# Patient Record
Sex: Female | Born: 1975 | Race: Black or African American | Hispanic: No | Marital: Married | Smoking: Never smoker
Health system: Southern US, Community
[De-identification: ages and names within clinical notes are randomized; demographics above are authoritative.]

## PROBLEM LIST (undated history)

## (undated) ENCOUNTER — Emergency Department (HOSPITAL_BASED_OUTPATIENT_CLINIC_OR_DEPARTMENT_OTHER): Admission: EM | Payer: PRIVATE HEALTH INSURANCE | Source: Home / Self Care

## (undated) DIAGNOSIS — J4540 Moderate persistent asthma, uncomplicated: Secondary | ICD-10-CM

## (undated) DIAGNOSIS — I10 Essential (primary) hypertension: Secondary | ICD-10-CM

## (undated) DIAGNOSIS — J3089 Other allergic rhinitis: Secondary | ICD-10-CM

## (undated) DIAGNOSIS — D649 Anemia, unspecified: Secondary | ICD-10-CM

## (undated) DIAGNOSIS — H1010 Acute atopic conjunctivitis, unspecified eye: Secondary | ICD-10-CM

## (undated) DIAGNOSIS — L500 Allergic urticaria: Secondary | ICD-10-CM

## (undated) DIAGNOSIS — Z973 Presence of spectacles and contact lenses: Secondary | ICD-10-CM

## (undated) DIAGNOSIS — Z8742 Personal history of other diseases of the female genital tract: Secondary | ICD-10-CM

## (undated) DIAGNOSIS — D250 Submucous leiomyoma of uterus: Secondary | ICD-10-CM

## (undated) DIAGNOSIS — Z8719 Personal history of other diseases of the digestive system: Secondary | ICD-10-CM

## (undated) DIAGNOSIS — Z9889 Other specified postprocedural states: Secondary | ICD-10-CM

## (undated) DIAGNOSIS — J302 Other seasonal allergic rhinitis: Secondary | ICD-10-CM

## (undated) DIAGNOSIS — L509 Urticaria, unspecified: Secondary | ICD-10-CM

## (undated) DIAGNOSIS — D509 Iron deficiency anemia, unspecified: Secondary | ICD-10-CM

## (undated) DIAGNOSIS — E876 Hypokalemia: Secondary | ICD-10-CM

## (undated) DIAGNOSIS — Z862 Personal history of diseases of the blood and blood-forming organs and certain disorders involving the immune mechanism: Secondary | ICD-10-CM

## (undated) HISTORY — DX: Urticaria, unspecified: L50.9

## (undated) HISTORY — PX: TRANSTHORACIC ECHOCARDIOGRAM: SHX275

## (undated) HISTORY — PX: CARDIOVASCULAR STRESS TEST: SHX262

## (undated) HISTORY — DX: Other specified postprocedural states: Z98.890

---
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
## 1997-12-25 ENCOUNTER — Encounter: Payer: Self-pay | Admitting: Emergency Medicine

## 1997-12-25 ENCOUNTER — Emergency Department (HOSPITAL_COMMUNITY): Admission: EM | Admit: 1997-12-25 | Discharge: 1997-12-25 | Payer: Self-pay | Admitting: Emergency Medicine

## 1998-02-01 ENCOUNTER — Inpatient Hospital Stay (HOSPITAL_COMMUNITY): Admission: AD | Admit: 1998-02-01 | Discharge: 1998-02-01 | Payer: Self-pay | Admitting: *Deleted

## 1998-03-27 ENCOUNTER — Emergency Department (HOSPITAL_COMMUNITY): Admission: EM | Admit: 1998-03-27 | Discharge: 1998-03-27 | Payer: Self-pay | Admitting: Emergency Medicine

## 1998-08-15 ENCOUNTER — Emergency Department (HOSPITAL_COMMUNITY): Admission: EM | Admit: 1998-08-15 | Discharge: 1998-08-15 | Payer: Self-pay | Admitting: Emergency Medicine

## 1998-08-26 ENCOUNTER — Inpatient Hospital Stay (HOSPITAL_COMMUNITY): Admission: AD | Admit: 1998-08-26 | Discharge: 1998-08-26 | Payer: Self-pay | Admitting: Obstetrics & Gynecology

## 1999-10-13 ENCOUNTER — Emergency Department (HOSPITAL_COMMUNITY): Admission: EM | Admit: 1999-10-13 | Discharge: 1999-10-13 | Payer: Self-pay | Admitting: *Deleted

## 2000-04-07 ENCOUNTER — Emergency Department (HOSPITAL_COMMUNITY): Admission: EM | Admit: 2000-04-07 | Discharge: 2000-04-07 | Payer: Self-pay | Admitting: Emergency Medicine

## 2000-10-05 ENCOUNTER — Emergency Department (HOSPITAL_COMMUNITY): Admission: EM | Admit: 2000-10-05 | Discharge: 2000-10-05 | Payer: Self-pay | Admitting: Emergency Medicine

## 2000-12-22 ENCOUNTER — Emergency Department (HOSPITAL_COMMUNITY): Admission: EM | Admit: 2000-12-22 | Discharge: 2000-12-22 | Payer: Self-pay | Admitting: Nurse Practitioner

## 2001-11-13 ENCOUNTER — Inpatient Hospital Stay (HOSPITAL_COMMUNITY): Admission: AD | Admit: 2001-11-13 | Discharge: 2001-11-13 | Payer: Self-pay | Admitting: Obstetrics and Gynecology

## 2002-01-03 ENCOUNTER — Inpatient Hospital Stay (HOSPITAL_COMMUNITY): Admission: AD | Admit: 2002-01-03 | Discharge: 2002-01-03 | Payer: Self-pay | Admitting: Family Medicine

## 2002-01-31 ENCOUNTER — Inpatient Hospital Stay (HOSPITAL_COMMUNITY): Admission: AD | Admit: 2002-01-31 | Discharge: 2002-01-31 | Payer: Self-pay | Admitting: *Deleted

## 2002-08-08 ENCOUNTER — Inpatient Hospital Stay (HOSPITAL_COMMUNITY): Admission: AD | Admit: 2002-08-08 | Discharge: 2002-08-08 | Payer: Self-pay | Admitting: *Deleted

## 2002-09-23 ENCOUNTER — Inpatient Hospital Stay (HOSPITAL_COMMUNITY): Admission: AD | Admit: 2002-09-23 | Discharge: 2002-09-23 | Payer: Self-pay | Admitting: *Deleted

## 2003-04-21 ENCOUNTER — Emergency Department (HOSPITAL_COMMUNITY): Admission: EM | Admit: 2003-04-21 | Discharge: 2003-04-21 | Payer: Self-pay | Admitting: Emergency Medicine

## 2003-05-26 ENCOUNTER — Emergency Department (HOSPITAL_COMMUNITY): Admission: EM | Admit: 2003-05-26 | Discharge: 2003-05-26 | Payer: Self-pay | Admitting: Emergency Medicine

## 2003-06-20 ENCOUNTER — Emergency Department (HOSPITAL_COMMUNITY): Admission: EM | Admit: 2003-06-20 | Discharge: 2003-06-20 | Payer: Self-pay | Admitting: Emergency Medicine

## 2003-06-28 ENCOUNTER — Inpatient Hospital Stay (HOSPITAL_COMMUNITY): Admission: AD | Admit: 2003-06-28 | Discharge: 2003-06-28 | Payer: Self-pay | Admitting: *Deleted

## 2003-12-08 ENCOUNTER — Ambulatory Visit: Payer: Self-pay | Admitting: Family Medicine

## 2003-12-21 ENCOUNTER — Ambulatory Visit: Payer: Self-pay | Admitting: *Deleted

## 2004-02-02 ENCOUNTER — Ambulatory Visit: Payer: Self-pay | Admitting: Family Medicine

## 2004-02-05 ENCOUNTER — Ambulatory Visit: Payer: Self-pay | Admitting: Family Medicine

## 2004-03-30 ENCOUNTER — Ambulatory Visit: Payer: Self-pay | Admitting: Internal Medicine

## 2004-04-05 ENCOUNTER — Ambulatory Visit: Payer: Self-pay | Admitting: Family Medicine

## 2004-05-03 ENCOUNTER — Ambulatory Visit: Payer: Self-pay | Admitting: Internal Medicine

## 2004-06-07 ENCOUNTER — Ambulatory Visit: Payer: Self-pay | Admitting: Family Medicine

## 2004-06-29 ENCOUNTER — Ambulatory Visit: Payer: Self-pay | Admitting: Internal Medicine

## 2004-07-25 ENCOUNTER — Ambulatory Visit: Payer: Self-pay | Admitting: Internal Medicine

## 2004-09-05 ENCOUNTER — Ambulatory Visit: Payer: Self-pay | Admitting: Family Medicine

## 2004-09-22 ENCOUNTER — Ambulatory Visit: Payer: Self-pay | Admitting: Family Medicine

## 2005-06-01 ENCOUNTER — Emergency Department (HOSPITAL_COMMUNITY): Admission: EM | Admit: 2005-06-01 | Discharge: 2005-06-01 | Payer: Self-pay | Admitting: Emergency Medicine

## 2007-03-14 ENCOUNTER — Other Ambulatory Visit: Admission: RE | Admit: 2007-03-14 | Discharge: 2007-03-14 | Payer: Self-pay | Admitting: Gynecology

## 2007-07-26 HISTORY — PX: DIAGNOSTIC LAPAROSCOPY: SUR761

## 2008-01-18 ENCOUNTER — Inpatient Hospital Stay (HOSPITAL_COMMUNITY): Admission: AD | Admit: 2008-01-18 | Discharge: 2008-01-18 | Payer: Self-pay | Admitting: Gynecology

## 2008-03-05 ENCOUNTER — Ambulatory Visit (HOSPITAL_COMMUNITY): Admission: RE | Admit: 2008-03-05 | Discharge: 2008-03-05 | Payer: Self-pay | Admitting: Gynecology

## 2008-04-21 ENCOUNTER — Emergency Department (HOSPITAL_COMMUNITY): Admission: EM | Admit: 2008-04-21 | Discharge: 2008-04-21 | Payer: Self-pay | Admitting: Emergency Medicine

## 2009-04-25 ENCOUNTER — Inpatient Hospital Stay (HOSPITAL_COMMUNITY): Admission: AD | Admit: 2009-04-25 | Discharge: 2009-04-25 | Payer: Self-pay | Admitting: Specialist

## 2009-11-09 ENCOUNTER — Inpatient Hospital Stay (HOSPITAL_COMMUNITY): Admission: AD | Admit: 2009-11-09 | Discharge: 2009-11-09 | Payer: Self-pay | Admitting: Gynecology

## 2010-06-09 LAB — GC/CHLAMYDIA PROBE AMP, GENITAL
Chlamydia, DNA Probe: NEGATIVE
GC Probe Amp, Genital: NEGATIVE

## 2010-06-09 LAB — WET PREP, GENITAL

## 2010-06-12 LAB — WET PREP, GENITAL
Trich, Wet Prep: NONE SEEN
Yeast Wet Prep HPF POC: NONE SEEN

## 2010-06-12 LAB — GC/CHLAMYDIA PROBE AMP, GENITAL
Chlamydia, DNA Probe: NEGATIVE
GC Probe Amp, Genital: NEGATIVE

## 2010-06-12 LAB — URINALYSIS, ROUTINE W REFLEX MICROSCOPIC
Bilirubin Urine: NEGATIVE
Glucose, UA: NEGATIVE mg/dL
Hgb urine dipstick: NEGATIVE
Specific Gravity, Urine: <1.005 — ABNORMAL LOW (ref 1.005–1.030)
pH: 5.5 (ref 5.0–8.0)

## 2010-08-09 NOTE — Consult Note (Signed)
Victoria George, Victoria George                ACCOUNT NO.:  [id number]   MEDICAL RECORD NO.:  [id number]          PATIENT TYPE:  MAT   LOCATION:  [facility]                          FACILITY:  [facility]   PHYSICIAN:  M. Leda Quail, MD  DATE OF BIRTH:  02-22-1976   DATE OF CONSULTATION:  01/18/1008  DATE OF DISCHARGE:                                 CONSULTATION   PRIMARY PHYSICIAN:  Leatha Gilding. Mezer, M.D.   CHIEF COMPLAINT:  Right lower quadrant pain.   HISTORY OF PRESENT ILLNESS:  The patient is a very nice 35 year old, G0,  African American female who experienced an episode of sharp, right lower  quadrant pain today at approximately 2:00 o'clock. This was accompanied  by some shortness of breath, as well as associated nausea. She reports  that it was very severe initially a 10 out of 10. She did not know what  was the cause of it and she is very anxious, so she and her husband came  on into triage. She reports that she felt very hot right before it but  she denies any fevers. She has had no issues with diarrhea or  constipation. She has had no issues with emesis and as well, no bladder  symptoms today including increased frequency, hesitancy, urgency, or  hematuria. The patient does report that earlier this week, she was seen  due to some symptoms consistent with a bladder infection by her primary  care physician but her evaluation was negative and that has resolved at  this time.   PAST MEDICAL HISTORY:  1. A surgery on her right tube that was done sometime in April by a      Dr. Payton Mccallum in [place], [place]. I do not have any records or      an operative report from that time period. The patient has had some      ultrasounds as well for evaluation and did undergo an      hysterosalpingogram, which showed blockage of the right tube. She      was told that they did a tube clean up and took off some scar      tissue via laparoscopy. She has not had any issues with that since      the time  period. She also reports that she has had some ovarian      cysts in the past and feels like this pain is similar to the same      pain she had when she had ovarian cysts.  2. Hypertension.   PAST SURGICAL HISTORY:  1. Laparoscopy with what sounds like lysis of adhesions in April 2009      [place]   MEDICATIONS:  1. Hydrochlorothiazide.  2. Phentermine. She is actively trying to lose weight.   ALLERGIES:  HYDROCODONE.   GYNECOLOGIC HISTORY:  She reports that other than the tubal issue, she  has no gynecologic issues. She denies any STD's and she has never been  told, to her knowledge, that she has fibroids or polyps.   SOCIAL HISTORY:  She is  married. She denies smoking, drugs, or alcohol  use. She is employed.   REVIEW OF SYSTEMS:  She has had no tachycardia with the associated  shortness of breath but the shortness of breath resolved fairly quickly,  as the pain improved. No back pain, no flank pain, no abdominal pain  other than on the right side. No nausea and vomiting. No diarrhea or  constipation as stated above. No lower extremity swelling. No vaginal  odor. No vaginal itching. She did start bleeding today, consistent with  her period, which is due.   PHYSICAL EXAMINATION:  VITAL SIGNS:  Temperature 97.6, pulse 74,  respiratory rate 20, blood pressure 118/68 and 127/72. Last menstrual  period would be today, January 18, 2008.  GENERAL:  A well developed, well nourished, obese, African-American  female in no acute distress.  CARDIOVASCULAR:  Regular rate and rhythm. Without murmur, rub, or  gallop.  LUNGS:  Clear to auscultation bilaterally with good respiratory effort.  Flank, no CVA tenderness.  ABDOMEN:  Soft, nondistended. Mild right lower quadrant tenderness. No  hepatosplenomegaly, hernias, or masses. She has no guarding or rebound  on the right side. The location of this pain is McBurney's point.  GENITOURINARY:  Normal  appearing external female genitalia.  Urethra,urethral meatus, and bladder appear normal and are not tender to  palpation. There are no masses present. The vagina, vaginal mucosa is  pink. There is no discharge. She does have some blood in the vaginal  vault, which is coming from her os. The cervix is smooth and mobile. On  bimanual examination, there is some nodularity on the right side of the  uterus, consistent with a fibroid with cervical motion tenderness. She  has normal feeling ovaries with a tenderness on her right adnexa,  primarily. Perineum is without any visible lesions.   LABORATORY DATA:  CBC showed white count of 6.7, hemoglobin 12.4,  platelet count of 322,000. Serum pregnancy test was negative.   DIAGNOSTIC STUDIES:  The pelvic ultrasound was performed while she was  here today. This showed a retroverted uterus measuring 7.8 x 4.2 x 4.4  cm with several small intra-mural fibroids, 3 that measure 1 to 2 cm and  then a pedunculated posterior fibroid off to the right that measures 2.3  x 1.7 x 2.7 cm. Endometrial strip is myogenous at 8 mm in greatest  dimension. Bilateral ovaries are unremarkable and measurement are within  normal limits. Right fallopian tube does have some internal echo that  measures 4.0 x 1.2 x 2.2 cm. Mild peripheral vascular flow is noted.  There is flow to both ovaries bilaterally. The radiologic interpretation  of this finding on the right side is that this may represent a scar from  prior procedure but pyosalpinx is not excluded.   DISCUSSION:  Given the fact that the patient has mild right lower  quadrant pain, which has improved since she has come into triage without  fever or white count, I am doubtful of an infectious process as the  source of this pain. I did discuss the ultrasound with the patient and  at this point, recommended a repeat ultrasound in 3 to 4 weeks for re-  evaluation. If this persists, she and Dr. Payton Mccallum will need to discuss   whether this is interfering with her desires for pregnancy. However, at  this time, she is not acute and I do not feel a need for further  evaluation today. I did discuss with her, signs and symptoms of  appendicitis, so that she and her husband are aware, if any symptoms  worsen. She reports being very sensitive to pain medication and does not  desire anything and will take Tylenol at home.   ASSESSMENT:  1. RIGHT LOWER QUADRANT PAIN:  Has significantly improved since she      has been in triage. Right fallopian tube with probable scarring and      internal echo's.   PLAN:  1. She will follow up with Dr. Chevis Pretty and he will be contacted on      Monday morning.  2. Repeat ultrasound is recommended in 3 to 4 weeks.  3. The patient will take pain medicine using Tylenol p.r.n. as      necessary for this right lower quadrant pain.  4. Again, signs and symptoms of appendicitis is given to the patient      and she will return if she has any worsening symptoms, particularly      worsening pain in association with fever.   DISPOSITION:  All questions were answered and the patient was discharged  to home with her husband.      Lum Keas, MD  Electronically Signed     MSM/MEDQ  D:  01/18/2008  T:  01/18/2008  Job:  [id number]   cc:   Leatha Gilding. Mezer, M.D.  Fax: [phone]

## 2010-11-21 ENCOUNTER — Encounter: Payer: Self-pay | Admitting: Obstetrics and Gynecology

## 2010-11-21 DIAGNOSIS — N390 Urinary tract infection, site not specified: Secondary | ICD-10-CM

## 2010-12-16 ENCOUNTER — Inpatient Hospital Stay (HOSPITAL_COMMUNITY)
Admission: AD | Admit: 2010-12-16 | Discharge: 2010-12-16 | Disposition: A | Discharge status: Home / Self Care | Payer: Self-pay | Source: Ambulatory Visit | Attending: Obstetrics & Gynecology | Admitting: Obstetrics & Gynecology

## 2010-12-16 ENCOUNTER — Encounter (HOSPITAL_COMMUNITY): Payer: Self-pay | Admitting: *Deleted

## 2010-12-16 DIAGNOSIS — B3731 Acute candidiasis of vulva and vagina: Secondary | ICD-10-CM | POA: Insufficient documentation

## 2010-12-16 DIAGNOSIS — B373 Candidiasis of vulva and vagina: Secondary | ICD-10-CM

## 2010-12-16 DIAGNOSIS — N949 Unspecified condition associated with female genital organs and menstrual cycle: Secondary | ICD-10-CM | POA: Insufficient documentation

## 2010-12-16 DIAGNOSIS — R300 Dysuria: Secondary | ICD-10-CM

## 2010-12-16 HISTORY — DX: Essential (primary) hypertension: I10

## 2010-12-16 LAB — URINALYSIS, ROUTINE W REFLEX MICROSCOPIC
Bilirubin Urine: NEGATIVE
Ketones, ur: NEGATIVE mg/dL
Specific Gravity, Urine: 1.025 (ref 1.005–1.030)
Urobilinogen, UA: 0.2 mg/dL (ref 0.0–1.0)

## 2010-12-16 LAB — URINE MICROSCOPIC-ADD ON

## 2010-12-16 LAB — WET PREP, GENITAL: Trich, Wet Prep: NONE SEEN

## 2010-12-16 MED ORDER — FLUCONAZOLE 150 MG PO TABS: 150.0000 mg | ORAL_TABLET | Freq: Once | ORAL | Status: AC

## 2010-12-16 NOTE — ED Provider Notes (Signed)
History   Pt presents today c/o dysuria and vag dc. She states she has had urinary frequency for the past week and began having some pain with urination today. She tried cranberry juice without relief. She also c/o vag dc with irritation that began today. She denies fever, vag bleeding, abd pain, or any other sx at this time.  Chief Complaint  Patient presents with  . Abdominal Pain   HPI  OB History    No data available      No past medical history on file.  No past surgical history on file.  No family history on file.  History  Substance Use Topics  . Smoking status: Not on file  . Smokeless tobacco: Not on file  . Alcohol Use: Not on file    Allergies:  Allergies  Allergen Reactions  . Shellfish Allergy Anaphylaxis  . Hydrocodone Rash    Prescriptions prior to admission  Medication Sig Dispense Refill  . Flaxseed, Linseed, (FLAXSEED OIL PO) Take 1 capsule by mouth daily.          Review of Systems  Constitutional: Negative for fever.  Cardiovascular: Negative for chest pain.  Gastrointestinal: Negative for nausea, vomiting, abdominal pain, diarrhea and constipation.  Genitourinary: Positive for dysuria, urgency and frequency. Negative for hematuria and flank pain.  Neurological: Negative for dizziness and headaches.  Psychiatric/Behavioral: Negative for depression and suicidal ideas.   Physical Exam   Blood pressure 142/94, pulse 77, temperature 98.8 F (37.1 C), temperature source Oral, resp. rate 16, height 6' 1.5" (1.867 m), weight 196 lb 2 oz (88.962 kg), last menstrual period 11/28/2010.  Physical Exam  Constitutional: She is oriented to person, place, and time. She appears well-developed and well-nourished. No distress.  HENT:  Head: Normocephalic and atraumatic.  Eyes: EOM are normal. Pupils are equal, round, and reactive to light.  GI: Soft. She exhibits no distension and no mass. There is no tenderness. There is no rebound and no guarding.    Genitourinary: Cervix exhibits no discharge and no friability. No bleeding around the vagina. Vaginal discharge found.  Neurological: She is alert and oriented to person, place, and time.  Skin: Skin is warm and dry. She is not diaphoretic.  Psychiatric: She has a normal mood and affect. Her behavior is normal. Judgment and thought content normal.    MAU Course  Procedures  Wet prep and GC/Chlamydia cultures done.  Results for orders placed during the hospital encounter of 12/16/10 (from the past 24 hour(s))  URINALYSIS, ROUTINE W REFLEX MICROSCOPIC     Status: Abnormal   Collection Time   12/16/10  8:35 PM      Component Value Range   Color, Urine YELLOW  YELLOW    Appearance CLEAR  CLEAR    Specific Gravity, Urine 1.025  1.005 - 1.030    pH 6.0  5.0 - 8.0    Glucose, UA NEGATIVE  NEGATIVE (mg/dL)   Hgb urine dipstick TRACE (*) NEGATIVE    Bilirubin Urine NEGATIVE  NEGATIVE    Ketones, ur NEGATIVE  NEGATIVE (mg/dL)   Protein, ur NEGATIVE  NEGATIVE (mg/dL)   Urobilinogen, UA 0.2  0.0 - 1.0 (mg/dL)   Nitrite NEGATIVE  NEGATIVE    Leukocytes, UA TRACE (*) NEGATIVE   URINE MICROSCOPIC-ADD ON     Status: Normal   Collection Time   12/16/10  8:35 PM      Component Value Range   Squamous Epithelial / LPF RARE  RARE  WBC, UA 0-2  <3 (WBC/hpf)   Bacteria, UA RARE  RARE    Urine-Other YEAST    WET PREP, GENITAL     Status: Abnormal   Collection Time   12/16/10  9:08 PM      Component Value Range   Yeast, Wet Prep MODERATE (*) NONE SEEN    Trich, Wet Prep NONE SEEN  NONE SEEN    Clue Cells, Wet Prep FEW (*) NONE SEEN    WBC, Wet Prep HPF POC MANY (*) NONE SEEN      Assessment and Plan  Yeast: will tx with diflucan. Discussed diet, activity, risks, and precautions.  Dysuria: will await results of urine culture. Discussed diet, activity, risks, and precautions. Advised pt to f/u with her PCP.  Clinton Gallant. Rice III, DrHSc, MPAS, PA-C  12/16/2010, 9:08 PM   Henrietta Hoover,  PA 12/16/10 2129

## 2010-12-16 NOTE — Progress Notes (Signed)
Pt states, " For this past week, I've had frequency on urination. I go and only have a trickle. Today I feel like I have pain in my bladder."

## 2010-12-17 LAB — GC/CHLAMYDIA PROBE AMP, GENITAL
Chlamydia, DNA Probe: NEGATIVE
GC Probe Amp, Genital: NEGATIVE

## 2010-12-26 LAB — DIFFERENTIAL
Basophils Absolute: 0
Basophils Relative: 1
Eosinophils Relative: 1
Monocytes Absolute: 0.5
Neutro Abs: 3.9

## 2010-12-26 LAB — CBC
HCT: 37.8
Hemoglobin: 12.4
MCHC: 32.8
RDW: 14.2

## 2011-01-26 ENCOUNTER — Emergency Department (HOSPITAL_COMMUNITY): Payer: Self-pay

## 2011-01-26 ENCOUNTER — Inpatient Hospital Stay (HOSPITAL_COMMUNITY)
Admission: EM | Admit: 2011-01-26 | Discharge: 2011-02-03 | DRG: 419 | Disposition: A | Discharge status: Home / Self Care | Payer: Self-pay | Attending: Surgery | Admitting: Surgery

## 2011-01-26 DIAGNOSIS — R509 Fever, unspecified: Secondary | ICD-10-CM

## 2011-01-26 DIAGNOSIS — R1013 Epigastric pain: Secondary | ICD-10-CM | POA: Diagnosis present

## 2011-01-26 DIAGNOSIS — K828 Other specified diseases of gallbladder: Principal | ICD-10-CM | POA: Diagnosis present

## 2011-01-26 DIAGNOSIS — K660 Peritoneal adhesions (postprocedural) (postinfection): Secondary | ICD-10-CM | POA: Diagnosis present

## 2011-01-26 DIAGNOSIS — I10 Essential (primary) hypertension: Secondary | ICD-10-CM | POA: Diagnosis present

## 2011-01-26 DIAGNOSIS — R110 Nausea: Secondary | ICD-10-CM | POA: Diagnosis present

## 2011-01-26 DIAGNOSIS — R1011 Right upper quadrant pain: Secondary | ICD-10-CM | POA: Diagnosis present

## 2011-01-26 DIAGNOSIS — R109 Unspecified abdominal pain: Secondary | ICD-10-CM

## 2011-01-26 DIAGNOSIS — K811 Chronic cholecystitis: Secondary | ICD-10-CM | POA: Diagnosis present

## 2011-01-26 LAB — DIFFERENTIAL
Eosinophils Absolute: 0 10*3/uL (ref 0.0–0.7)
Eosinophils Relative: 0 % (ref 0–5)
Lymphs Abs: 1.7 10*3/uL (ref 0.7–4.0)
Monocytes Absolute: 0.9 10*3/uL (ref 0.1–1.0)
Monocytes Relative: 16 % — ABNORMAL HIGH (ref 3–12)

## 2011-01-26 LAB — COMPREHENSIVE METABOLIC PANEL
AST: 31 U/L (ref 0–37)
Albumin: 3.5 g/dL (ref 3.5–5.2)
Calcium: 9.5 mg/dL (ref 8.4–10.5)
Creatinine, Ser: 1.03 mg/dL (ref 0.50–1.10)
GFR calc non Af Amer: 69 mL/min — ABNORMAL LOW (ref >90)

## 2011-01-26 LAB — URINE MICROSCOPIC-ADD ON

## 2011-01-26 LAB — CBC
MCH: 28.6 pg (ref 26.0–34.0)
MCV: 85 fL (ref 78.0–100.0)
Platelets: 321 10*3/uL (ref 150–400)
RDW: 13.4 % (ref 11.5–15.5)

## 2011-01-26 LAB — LIPASE, BLOOD: Lipase: 20 U/L (ref 11–59)

## 2011-01-26 LAB — URINALYSIS, ROUTINE W REFLEX MICROSCOPIC
Ketones, ur: NEGATIVE mg/dL
Protein, ur: NEGATIVE mg/dL
Urobilinogen, UA: 0.2 mg/dL (ref 0.0–1.0)

## 2011-01-27 ENCOUNTER — Observation Stay (HOSPITAL_COMMUNITY): Payer: Self-pay

## 2011-01-27 LAB — CBC
HCT: 34 % — ABNORMAL LOW (ref 36.0–46.0)
Hemoglobin: 11.3 g/dL — ABNORMAL LOW (ref 12.0–15.0)
MCH: 28.1 pg (ref 26.0–34.0)
MCHC: 33.2 g/dL (ref 30.0–36.0)
MCV: 84.6 fL (ref 78.0–100.0)
RDW: 13.6 % (ref 11.5–15.5)

## 2011-01-27 MED ORDER — ONDANSETRON HCL 4 MG/2ML IJ SOLN
4.0000 mg | Freq: Four times a day (QID) | INTRAMUSCULAR | Status: DC
  Administered 2011-01-29 – 2011-02-03 (×17): 4 mg via INTRAVENOUS
  Filled 2011-01-27 (×9): qty 2

## 2011-01-27 MED ORDER — TECHNETIUM TC 99M MEBROFENIN IV KIT
4.5000 | PACK | Freq: Once | INTRAVENOUS | Status: AC | PRN
  Administered 2011-01-27: 4.5 via INTRAVENOUS

## 2011-01-27 MED ORDER — SODIUM CHLORIDE 0.9 % IV SOLN
3.0000 g | Freq: Four times a day (QID) | INTRAVENOUS | Status: DC
  Filled 2011-01-27 (×9): qty 3

## 2011-01-27 MED ORDER — IOHEXOL 300 MG/ML  SOLN
80.0000 mL | Freq: Once | INTRAMUSCULAR | Status: AC | PRN
  Administered 2011-01-27: 80 mL via INTRAVENOUS

## 2011-01-27 MED ORDER — KCL IN DEXTROSE-NACL 20-5-0.9 MEQ/L-%-% IV SOLN
INTRAVENOUS | Status: DC
  Filled 2011-01-27 (×7): qty 1000

## 2011-01-27 MED ORDER — MORPHINE SULFATE 2 MG/ML IJ SOLN
2.0000 mg | INTRAMUSCULAR | Status: DC | PRN
  Administered 2011-01-29 – 2011-02-02 (×12): 2 mg via INTRAVENOUS
  Filled 2011-01-27 (×2): qty 1

## 2011-01-28 LAB — CBC
MCH: 28 pg (ref 26.0–34.0)
MCHC: 32.6 g/dL (ref 30.0–36.0)
MCV: 85.9 fL (ref 78.0–100.0)
Platelets: 270 10*3/uL (ref 150–400)
RBC: 3.97 MIL/uL (ref 3.87–5.11)

## 2011-01-28 LAB — DIFFERENTIAL
Basophils Absolute: 0 10*3/uL (ref 0.0–0.1)
Basophils Relative: 0 % (ref 0–1)
Eosinophils Absolute: 0 10*3/uL (ref 0.0–0.7)
Lymphocytes Relative: 38 % (ref 12–46)
Lymphs Abs: 1.7 10*3/uL (ref 0.7–4.0)
Monocytes Absolute: 0.5 10*3/uL (ref 0.1–1.0)
Neutro Abs: 2.3 10*3/uL (ref 1.7–7.7)

## 2011-01-28 MED ORDER — PROMETHAZINE HCL 25 MG/ML IJ SOLN
12.5000 mg | INTRAMUSCULAR | Status: DC | PRN
  Administered 2011-02-01 – 2011-02-02 (×2): 12.5 mg via INTRAVENOUS

## 2011-01-28 MED ORDER — PANTOPRAZOLE SODIUM 40 MG IV SOLR
40.0000 mg | Freq: Two times a day (BID) | INTRAVENOUS | Status: DC
  Administered 2011-01-29 – 2011-02-03 (×10): 40 mg via INTRAVENOUS
  Filled 2011-01-28 (×13): qty 40

## 2011-01-28 MED ORDER — ONDANSETRON HCL 4 MG/2ML IJ SOLN: 4.0000 mg | Freq: Four times a day (QID) | INTRAMUSCULAR | Status: DC | PRN

## 2011-01-28 MED ORDER — ENOXAPARIN SODIUM 40 MG/0.4ML ~~LOC~~ SOLN
40.0000 mg | SUBCUTANEOUS | Status: DC
  Administered 2011-01-29 – 2011-01-30 (×2): 40 mg via SUBCUTANEOUS
  Filled 2011-01-28 (×5): qty 0.4

## 2011-01-28 MED ORDER — KCL IN DEXTROSE-NACL 20-5-0.45 MEQ/L-%-% IV SOLN
INTRAVENOUS | Status: DC
  Administered 2011-01-29 – 2011-02-01 (×9): via INTRAVENOUS
  Filled 2011-01-28 (×19): qty 1000

## 2011-01-29 DIAGNOSIS — R1013 Epigastric pain: Secondary | ICD-10-CM | POA: Diagnosis present

## 2011-01-29 DIAGNOSIS — R110 Nausea: Secondary | ICD-10-CM

## 2011-01-29 DIAGNOSIS — R932 Abnormal findings on diagnostic imaging of liver and biliary tract: Secondary | ICD-10-CM

## 2011-01-29 MED ORDER — MORPHINE SULFATE 2 MG/ML IJ SOLN
INTRAMUSCULAR | Status: AC
  Administered 2011-01-30: 2 mg via INTRAVENOUS
  Filled 2011-01-29: qty 1

## 2011-01-29 MED ORDER — ONDANSETRON HCL 4 MG/2ML IJ SOLN
INTRAMUSCULAR | Status: AC
  Administered 2011-01-29: 4 mg via INTRAVENOUS
  Filled 2011-01-29: qty 2

## 2011-01-29 MED ORDER — ACETAMINOPHEN 325 MG PO TABS: 650.0000 mg | ORAL_TABLET | ORAL | Status: DC | PRN

## 2011-01-29 MED ORDER — MORPHINE SULFATE 2 MG/ML IJ SOLN
INTRAMUSCULAR | Status: AC
  Administered 2011-01-29: 2 mg via INTRAVENOUS
  Filled 2011-01-29: qty 1

## 2011-01-29 NOTE — Progress Notes (Signed)
     Subjective: Patient notes improved pain. Patient denies nausea or vomiting. Patient is tolerating a clear liquid diet.  Objective: Vital signs in last 24 hours: Temp:  [97.3 F (36.3 C)-97.6 F (36.4 C)] 97.3 F (36.3 C) (11/04 0526) Pulse Rate:  [73-75] 75  (11/04 0526) Resp:  [16] 16  (11/04 0526) BP: (97-107)/(63-75) 107/75 mmHg (11/04 0526) SpO2:  [98 %-100 %] 100 % (11/04 0526)    Intake/Output from previous day: 11/03 0701 - 11/04 0700 In: 1018 [I.V.:1018] Out: -  Intake/Output this shift:    EXAM: Patient is alert and oriented. Chest is clear to auscultation without rales rhonchi or wheeze. Cardiac exam shows regular rate and rhythm. Abdomen is soft without distention. Bowel sounds are present. Mild tenderness in the epigastrium with voluntary guarding.  Lab Results:   BMET  Basename 01/26/11 1320  NA 131*  K 3.3*  CL 93*  CO2 28  GLUCOSE 84  BUN 9  CREATININE 1.03  CALCIUM 9.5     Studies/Results: Ct Abdomen Pelvis W Contrast  01/27/2011  *RADIOLOGY REPORT*  Clinical Data: Abdominal pain and fever.  Nausea.  CT ABDOMEN AND PELVIS WITH CONTRAST  Technique:  Multidetector CT imaging of the abdomen and pelvis was performed following the standard protocol during bolus administration of intravenous contrast.  Contrast: 80mL OMNIPAQUE IOHEXOL 300 MG/ML IV SOLN  Comparison: Previous ultrasound examinations.  Findings: Minimal free peritoneal fluid in the pelvis, within normal limits of physiological fluid.  Previously noted fibroid uterus.  The largest visible uterine mass is on the left, measuring approximately 3.5 cm in maximum diameter and displacing the endometrial stripe to the right.  Normal appearing ovaries.  Unremarkable liver, spleen, pancreas, gallbladder, adrenal glands, left kidney and urinary bladder.  Small exophytic lower pole right renal cyst.  No gastrointestinal abnormalities.  Mildly prominent retroperitoneal lymph nodes with no pathologically  enlarged lymph nodes seen.  Normal appearing appendix.  Clear lung bases. Unremarkable bones.  IMPRESSION:  1.  No acute abnormality. 2.  Uterine fibroids.  Original Report Authenticated By: Darrol Angel, M.D.    Anti-infectives: Anti-infectives     Start     Dose/Rate Route Frequency Ordered Stop   01/27/11 1800   Ampicillin-Sulbactam (UNASYN) 3 g in sodium chloride 0.9 % 100 mL IVPB  Status:  Discontinued        3 g 100 mL/hr over 60 Minutes Intravenous 4 times per day 01/27/11 1737 01/28/11 1059          Assessment/Plan: Abdominal pain of undetermined etiology. No acute surgical pathology. Will request gastroenterology consultation today for possible EGD. Rule out peptic ulcer disease. Patient on him. Her proton pump inhibitor therapy.   LOS: 3 days    Brittny Spangle M 01/29/2011

## 2011-01-29 NOTE — Consult Note (Signed)
I have reviewed records, taken a history and examined the patient.  I agree with current management plan.  Malcolm T. Stark MD FACG 01/29/2011, 11:28 AM 

## 2011-01-29 NOTE — Consult Note (Signed)
Referring Provider: Zachery Dakins Primary Care Physician:  No primary provider on file. Primary Gastroenterologist:  unassigned  Reason for Consultation:   Epigastric pain, fever  HPI: Victoria George is a 35 y.o. female  Admitted on 11/01 with c/o epigastric pain, progressive over a few days and associated with fevers at home prior to admit. Thus far workup has been negative, except for US showing tiny stones vs sludge. HIDA scan is negative. Liver tests are normal. CT scan is also negative. Her pain is now following meals and associated with nausea. No ASA/NSAID usage. No prior GI disorders.   Past Medical History  Diagnosis Date  . Hypertension     No past surgical history on file.  Prior to Admission medications   Medication Sig Start Date End Date Taking? Authorizing Provider  hydrochlorothiazide (HYDRODIURIL) 25 MG tablet Take 25 mg by mouth daily.     Yes Historical Provider, MD  ibuprofen (ADVIL,MOTRIN) 200 MG tablet Take 200 mg by mouth every 8 (eight) hours as needed. For pain    Yes Historical Provider, MD  phentermine 37.5 MG capsule Take 18.75 mg by mouth daily.     Yes Historical Provider, MD    Current Facility-Administered Medications  Medication Dose Route Frequency Provider Last Rate Last Dose  . acetaminophen (TYLENOL) tablet 650 mg  650 mg Oral Q4H PRN Rebekah Sol Passer, PHARMD      . dextrose 5 % and 0.45 % NaCl with KCl 20 mEq/L infusion   Intravenous Continuous Velora Heckler, MD 125 mL/hr at 01/29/11 0906    . enoxaparin (LOVENOX) injection 40 mg  40 mg Subcutaneous Q24H Severiano Gilbert, PHARMD   40 mg at 01/29/11 1048  . morphine 2 MG/ML injection 2 mg  2 mg Intravenous Q1H PRN Cherylynn Ridges III, MD      . ondansetron [facility]) injection 4 mg  4 mg Intravenous Q6H Cherylynn Ridges III, MD   4 mg at 01/29/11 0545  . pantoprazole (PROTONIX) injection 40 mg  40 mg Intravenous Q12H Thomas A. Cornett, MD   40 mg at 01/29/11 1048  . promethazine (PHENERGAN) injection  12.5 mg  12.5 mg Intravenous Q4H PRN Wilmon Arms. Tsuei, MD      . DISCONTD: dextrose 5 % and 0.9 % NaCl with KCl 20 mEq/L infusion   Intravenous Continuous Cherylynn Ridges III, MD      . DISCONTD: ondansetron [facility]) injection 4 mg  4 mg Intravenous Q6H PRN Velora Heckler, MD        Allergies as of 01/26/2011 - Review Complete 01/26/2011  Allergen Reaction Noted  . Shellfish allergy Anaphylaxis 12/16/2010  . Hydrocodone Rash 12/16/2010   Review of Systems: Gen: Denies any weight loss, and sleep disorder CV: Denies chest pain, angina, palpitations, syncope, orthopnea, PND, peripheral edema, and claudication. Resp: Denies dyspnea at rest, dyspnea with exercise, cough, sputum, wheezing, coughing up blood, and pleurisy. GI: Denies vomiting blood, jaundice, and fecal incontinence. Denies dysphagia or odynophagia. GU : Denies urinary burning, blood in urine, urinary frequency, urinary hesitancy, nocturnal urination, and urinary incontinence. MS: Denies joint pain, limitation of movement, and swelling, stiffness, low back pain, extremity pain. Denies muscle weakness, cramps, atrophy.  Derm: Denies rash, itching, dry skin, hives, moles, warts, or unhealing ulcers.  Psych: Denies depression, anxiety, memory loss, suicidal ideation, hallucinations, paranoia, and confusion. Heme: Denies bruising, bleeding, and enlarged lymph nodes. Neuro:  Denies any headaches, dizziness, paresthesias. Endo:  Denies any problems with DM, thyroid,  adrenal function.  Physical Exam: Vital signs in last 24 hours: Temp:  [97.3 F (36.3 C)-97.8 F (36.6 C)] 97.8 F (36.6 C) (11/04 1000) Pulse Rate:  [73-82] 82  (11/04 1000) Resp:  [16-18] 18  (11/04 1000) BP: (97-113)/(63-79) 113/79 mmHg (11/04 1000) SpO2:  [98 %-100 %] 100 % (11/04 1000) Last BM Date: 01/27/11 General:   Well-developed, well-nourished, pleasant and cooperative in NAD Head:  Normocephalic and atraumatic. Eyes:  Sclera clear, no icterus.   Conjunctiva  pink. Ears:  Normal auditory acuity. Nose:  No deformity, discharge,  or lesions. Mouth:  No deformity or lesions.  Oropharynx pink & moist. Neck:  Supple; no masses or thyromegaly. Lungs:  Clear throughout to auscultation.   No wheezes, crackles, or rhonchi. No acute distress. Heart:  Regular rate and rhythm; no murmurs, clicks, rubs,  or gallops. Abdomen:  Soft, epigastric/RUQ tenderness without rebound or guarding and nondistended. No masses, hepatosplenomegaly or hernias noted. Normal bowel sounds, without guarding, and without rebound.   Msk:  Symmetrical without gross deformities. Normal posture. Pulses:  Normal pulses noted. Extremities:  Without clubbing or edema. Neurologic:  Alert and  oriented x4;  grossly normal neurologically. Skin:  Intact without significant lesions or rashes. Psych:  Alert and cooperative. Normal mood and affect.  Intake/Output from previous day: 11/03 0701 - 11/04 0700 In: 1018 [I.V.:1018] Out: -  Intake/Output this shift:    Lab Results:  Basename 01/28/11 0355 01/27/11 0403 01/26/11 1320  WBC 4.5 6.2 5.5  HGB 11.1* 11.3* 12.8  HCT 34.1* 34.0* 38.0  PLT 270 296 321   BMET  Basename 01/26/11 1320  NA 131*  K 3.3*  CL 93*  CO2 28  GLUCOSE 84  BUN 9  CREATININE 1.03  CALCIUM 9.5   LFT  Basename 01/26/11 1320  PROT 8.2  ALBUMIN 3.5  AST 31  ALT 23  ALKPHOS 72  BILITOT 0.2*  BILIDIR --  IBILI --   Studies/Results: Ct Abdomen Pelvis W Contrast  01/27/2011  *RADIOLOGY REPORT*  Clinical Data: Abdominal pain and fever.  Nausea.  CT ABDOMEN AND PELVIS WITH CONTRAST  Technique:  Multidetector CT imaging of the abdomen and pelvis was performed following the standard protocol during bolus administration of intravenous contrast.  Contrast: 80mL OMNIPAQUE IOHEXOL 300 MG/ML IV SOLN  Comparison: Previous ultrasound examinations.  Findings: Minimal free peritoneal fluid in the pelvis, within normal limits of physiological fluid.  Previously  noted fibroid uterus.  The largest visible uterine mass is on the left, measuring approximately 3.5 cm in maximum diameter and displacing the endometrial stripe to the right.  Normal appearing ovaries.  Unremarkable liver, spleen, pancreas, gallbladder, adrenal glands, left kidney and urinary bladder.  Small exophytic lower pole right renal cyst.  No gastrointestinal abnormalities.  Mildly prominent retroperitoneal lymph nodes with no pathologically enlarged lymph nodes seen.  Normal appearing appendix.  Clear lung bases. Unremarkable bones.  IMPRESSION:  1.  No acute abnormality. 2.  Uterine fibroids.  Original Report Authenticated By: Darrol Angel, M.D.    Impression:  Epigastric/RUQ pain, associated with fevers and nausea: R/O biliary etiology, viral syndrome, ulcer, gastritis.  Plan:  Schedule EGD tomorrow Consider CCK-HIDA if EGD is negative Continue current medications  Dayannara Pascal  01/29/2011, 11:53 AM

## 2011-01-30 ENCOUNTER — Encounter (HOSPITAL_COMMUNITY): Admission: EM | Disposition: A | Discharge status: Home / Self Care | Payer: Self-pay | Source: Home / Self Care

## 2011-01-30 ENCOUNTER — Encounter (HOSPITAL_COMMUNITY): Payer: Self-pay | Admitting: *Deleted

## 2011-01-30 HISTORY — PX: ESOPHAGOGASTRODUODENOSCOPY: SHX5428

## 2011-01-30 SURGERY — EGD (ESOPHAGOGASTRODUODENOSCOPY)
Anesthesia: Moderate Sedation

## 2011-01-30 MED ORDER — ONDANSETRON HCL 4 MG/2ML IJ SOLN
INTRAMUSCULAR | Status: AC
  Administered 2011-01-30: 4 mg via INTRAVENOUS
  Filled 2011-01-30: qty 2

## 2011-01-30 MED ORDER — MORPHINE SULFATE 2 MG/ML IJ SOLN
INTRAMUSCULAR | Status: AC
Start: 2011-01-30 — End: 2011-01-30
  Administered 2011-01-30: 2 mg via INTRAVENOUS
  Filled 2011-01-30: qty 1

## 2011-01-30 MED ORDER — FENTANYL CITRATE 0.05 MG/ML IJ SOLN
INTRAMUSCULAR | Status: DC | PRN
  Administered 2011-01-30: 25 ug via INTRAVENOUS
  Administered 2011-01-30: 10 ug via INTRAVENOUS
  Administered 2011-01-30 (×2): 20 ug via INTRAVENOUS

## 2011-01-30 MED ORDER — MIDAZOLAM HCL 10 MG/2ML IJ SOLN
INTRAMUSCULAR | Status: DC | PRN
  Administered 2011-01-30 (×3): 2 mg via INTRAVENOUS

## 2011-01-30 MED ORDER — BUTAMBEN-TETRACAINE-BENZOCAINE 2-2-14 % EX AERO
INHALATION_SPRAY | CUTANEOUS | Status: DC | PRN
  Administered 2011-01-30: 2 via TOPICAL

## 2011-01-30 MED ORDER — ACETAMINOPHEN 325 MG PO TABS
ORAL_TABLET | ORAL | Status: AC
  Filled 2011-01-30: qty 2

## 2011-01-30 NOTE — H&P (Signed)
Pt in endoscopy for EGD. Pt is an inpatient, admitted with epigastric abd pain. H and P in paper chart are reviewed and unchanged.

## 2011-01-30 NOTE — Progress Notes (Signed)
  Subjective: Pt cont to have upper abd discomfort, though not as severe. She reports it did come on last pm after eating some broth and jello.  EGD done this am finds no acute findings to account for sxs per GI. Fever has resolved.  Objective: Vital signs in last 24 hours: Temp:  [97.9 F (36.6 C)-98.6 F (37 C)] 97.9 F (36.6 C) (11/05 1053) Pulse Rate:  [71-92] 71  (11/05 1053) Resp:  [11-26] 16  (11/05 1053) BP: (101-138)/(68-87) 130/83 mmHg (11/05 1053) SpO2:  [98 %-100 %] 99 % (11/05 1053) Weight:  [180 lb (81.647 kg)] 180 lb (81.647 kg) (11/05 0837) Last BM Date: 01/28/11  Intake/Output this shift: Total I/O In: 0  Out: 550 [Urine:550]  Physical Exam: Abd: soft, ND Mildly tender upper abd/RUQ, no rebound. Few BS  Lab Results:   Basename 01/28/11 0355  WBC 4.5  HGB 11.1*  HCT 34.1*  PLT 270   BMET No results found for this basename: NA:2,K:2,CL:2,CO2:2,GLUCOSE:2,BUN:2,CREATININE:2,CALCIUM:2 in the last 72 hours PT/INR No results found for this basename: LABPROT:2,INR:2 in the last 72 hours ABG No results found for this basename: PHART:2,PCO2:2,PO2:2,HCO3:2 in the last 72 hours  Studies/Results: No results found.   Assessment/Plan: Principal Problem:  *Abdominal pain, epigastric ESOPHAGOGASTRODUODENOSCOPY (EGD) today negative. HTN-stable. Cont to hold BP meds.  Will continue clears and advance as tolerated. She may still require dx lap/cholecystectomy if no diet toleration, despite negative workup. Check labs in am.   LOS: 4 days    Marianna Fuss 01/30/2011

## 2011-01-30 NOTE — Progress Notes (Signed)
Patient seen and examined.  Still with RUQ tenderness.  If she continues to have the pain the laparoscopic cholecystectomy this admission will be considered.  There is no guarantee that this will relieve her sxs and she understands that.  I have explained the procedure, risks, and aftercare of cholecystectomy (if it comes to that).  Risks include but are not limited to bleeding, infection, wound problems, anesthesia, diarrhea, bile leak, injury to common bile duct/liver/intestine.   She seems to understand and agrees to proceed.

## 2011-01-31 LAB — DIFFERENTIAL
Eosinophils Absolute: 0.1 10*3/uL (ref 0.0–0.7)
Monocytes Absolute: 0.4 10*3/uL (ref 0.1–1.0)
Neutrophils Relative %: 38 % — ABNORMAL LOW (ref 43–77)

## 2011-01-31 LAB — CBC
MCH: 28.4 pg (ref 26.0–34.0)
MCHC: 33.5 g/dL (ref 30.0–36.0)
Platelets: 299 10*3/uL (ref 150–400)
RBC: 3.77 MIL/uL — ABNORMAL LOW (ref 3.87–5.11)
RDW: 14 % (ref 11.5–15.5)

## 2011-01-31 LAB — BASIC METABOLIC PANEL
Calcium: 8.7 mg/dL (ref 8.4–10.5)
Creatinine, Ser: 0.93 mg/dL (ref 0.50–1.10)
GFR calc non Af Amer: 78 mL/min — ABNORMAL LOW (ref >90)
Sodium: 135 mEq/L (ref 135–145)

## 2011-01-31 NOTE — Progress Notes (Signed)
Entered the patient's room to administer the 1800 dose of Zofran - patient was eating cookies. Patient clearly understands that she should be on a clear liquid diet as well as NPO after midnight. Will continue to monitor. Thanks Charlynne Pander, RN

## 2011-01-31 NOTE — Progress Notes (Signed)
1 Day Post-Op  Subjective: Pt still with c/o upper abd pain, especially after eating, even clears. No N/V, but no progress at all with sxs. She also c/o ear pain, hurts with swallowing, sounds like Eustacean tube dysfunction.  Objective: Vital signs in last 24 hours: Temp:  [97.8 F (36.6 C)-98.3 F (36.8 C)] 97.8 F (36.6 C) (11/06 0615) Pulse Rate:  [70-92] 81  (11/06 0615) Resp:  [12-18] 18  (11/06 0615) BP: (99-130)/(67-86) 113/81 mmHg (11/06 0615) SpO2:  [92 %-100 %] 99 % (11/06 0615) Last BM Date: 01/27/11  Intake/Output this shift:    Physical Exam: Abdomen soft, ND, but tender upper abd.  Lab Results:   [facility] 01/31/11 0335  WBC 3.9*  HGB 10.7*  HCT 31.9*  PLT 299   BMET  Basename 01/31/11 0335  NA 135  K 4.0  CL 104  CO2 24  GLUCOSE 111*  BUN <3*  CREATININE 0.93  CALCIUM 8.7   PT/INR No results found for this basename: LABPROT:2,INR:2 in the last 72 hours ABG No results found for this basename: PHART:2,PCO2:2,PO2:2,HCO3:2 in the last 72 hours  Studies/Results: No results found.   Assessment/Plan: Abd pain, uncertain etiology but biliary source is suspected. She has not made any progress and continues to be symptomatic. Will now plan OR, Dx laparoscopy and chole possible later today vs.tomorrow. Discussed procedure: I discussed the procedure in detail.  The patient was given Agricultural engineer.  We discussed the risks and benefits of a laparoscopic cholecystectomy and possible cholangiogram including, but not limited to bleeding, infection, injury to surrounding structures such as the intestine or liver, bile leak, retained gallstones, need to convert to an open procedure, prolonged diarrhea, blood clots such as  DVT, common bile duct injury, anesthesia risks, and possible need for additional procedures.  The likelihood of improvement in symptoms and return to the patient's normal status is good. We discussed the typical post-operative recovery  course.   s/p Procedure(s): ESOPHAGOGASTRODUODENOSCOPY (EGD)   LOS: 5 days    Victoria George 01/31/2011

## 2011-01-31 NOTE — Progress Notes (Signed)
Pt seen and examined.  Plan Lap chole later today or tomorrow pending OR availability.

## 2011-02-01 ENCOUNTER — Encounter (HOSPITAL_COMMUNITY): Admission: EM | Disposition: A | Discharge status: Home / Self Care | Payer: Self-pay | Source: Home / Self Care

## 2011-02-01 ENCOUNTER — Encounter (HOSPITAL_COMMUNITY): Payer: Self-pay | Admitting: Anesthesiology

## 2011-02-01 ENCOUNTER — Inpatient Hospital Stay (HOSPITAL_COMMUNITY): Payer: Self-pay | Admitting: Anesthesiology

## 2011-02-01 ENCOUNTER — Other Ambulatory Visit (INDEPENDENT_AMBULATORY_CARE_PROVIDER_SITE_OTHER): Payer: Self-pay | Admitting: General Surgery

## 2011-02-01 ENCOUNTER — Inpatient Hospital Stay (HOSPITAL_COMMUNITY): Payer: Self-pay

## 2011-02-01 DIAGNOSIS — K811 Chronic cholecystitis: Secondary | ICD-10-CM

## 2011-02-01 HISTORY — PX: CHOLECYSTECTOMY: SHX55

## 2011-02-01 LAB — PREGNANCY, URINE: Preg Test, Ur: NEGATIVE

## 2011-02-01 SURGERY — LAPAROSCOPIC CHOLECYSTECTOMY WITH INTRAOPERATIVE CHOLANGIOGRAM
Anesthesia: General | Site: Abdomen | Wound class: Clean Contaminated

## 2011-02-01 MED ORDER — HYDROMORPHONE HCL PF 1 MG/ML IJ SOLN
INTRAMUSCULAR | Status: DC | PRN
  Administered 2011-02-01: 1 mg via INTRAVENOUS
  Administered 2011-02-01: .5 mg via INTRAVENOUS

## 2011-02-01 MED ORDER — CEFAZOLIN SODIUM 1-5 GM-% IV SOLN
1.0000 g | INTRAVENOUS | Status: AC
  Administered 2011-02-01: 2 g via INTRAVENOUS
  Filled 2011-02-01: qty 50

## 2011-02-01 MED ORDER — LACTATED RINGERS IR SOLN
Status: DC | PRN
  Administered 2011-02-01: 2000 mL
  Administered 2011-02-01: 1000 mL

## 2011-02-01 MED ORDER — GLYCOPYRROLATE 0.2 MG/ML IJ SOLN
INTRAMUSCULAR | Status: DC | PRN
  Administered 2011-02-01: .6 mg via INTRAVENOUS

## 2011-02-01 MED ORDER — ROCURONIUM BROMIDE 100 MG/10ML IV SOLN
INTRAVENOUS | Status: DC | PRN
  Administered 2011-02-01: 40 mg via INTRAVENOUS

## 2011-02-01 MED ORDER — ONDANSETRON HCL 4 MG/2ML IJ SOLN
INTRAMUSCULAR | Status: DC | PRN
  Administered 2011-02-01: 4 mg via INTRAVENOUS

## 2011-02-01 MED ORDER — LIDOCAINE HCL (CARDIAC) 20 MG/ML IV SOLN
INTRAVENOUS | Status: DC | PRN
  Administered 2011-02-01: 80 mg via INTRAVENOUS

## 2011-02-01 MED ORDER — LACTATED RINGERS IV SOLN
INTRAVENOUS | Status: DC | PRN
  Administered 2011-02-01 (×2): via INTRAVENOUS

## 2011-02-01 MED ORDER — FENTANYL CITRATE 0.05 MG/ML IJ SOLN
INTRAMUSCULAR | Status: DC | PRN
  Administered 2011-02-01 (×2): 100 ug via INTRAVENOUS
  Administered 2011-02-01: 50 ug via INTRAVENOUS

## 2011-02-01 MED ORDER — PROMETHAZINE HCL 25 MG/ML IJ SOLN: 6.2500 mg | INTRAMUSCULAR | Status: DC | PRN

## 2011-02-01 MED ORDER — MIDAZOLAM HCL 5 MG/5ML IJ SOLN
INTRAMUSCULAR | Status: DC | PRN
  Administered 2011-02-01: 2 mg via INTRAVENOUS

## 2011-02-01 MED ORDER — SODIUM CHLORIDE 0.9 % IR SOLN
Status: DC | PRN
  Administered 2011-02-01: 1000 mL

## 2011-02-01 MED ORDER — NEOSTIGMINE METHYLSULFATE 1 MG/ML IJ SOLN
INTRAMUSCULAR | Status: DC | PRN
  Administered 2011-02-01: 4 mg via INTRAVENOUS

## 2011-02-01 MED ORDER — VITAMINS A & D EX OINT
TOPICAL_OINTMENT | CUTANEOUS | Status: AC
  Filled 2011-02-01: qty 5

## 2011-02-01 MED ORDER — IOHEXOL 300 MG/ML  SOLN
INTRAMUSCULAR | Status: DC | PRN
  Administered 2011-02-01: 4 mL

## 2011-02-01 MED ORDER — BUPIVACAINE-EPINEPHRINE PF 0.5-1:200000 % IJ SOLN
INTRAMUSCULAR | Status: DC | PRN
  Administered 2011-02-01: 15 mL

## 2011-02-01 MED ORDER — SODIUM CHLORIDE 0.45 % IV SOLN: Freq: Once | INTRAVENOUS | Status: DC

## 2011-02-01 MED ORDER — HYDROMORPHONE HCL PF 1 MG/ML IJ SOLN
0.2500 mg | INTRAMUSCULAR | Status: DC | PRN
  Administered 2011-02-01: 0.25 mg via INTRAVENOUS

## 2011-02-01 MED ORDER — FENTANYL CITRATE 0.05 MG/ML IJ SOLN: 25.0000 ug | INTRAMUSCULAR | Status: DC | PRN

## 2011-02-01 MED ORDER — HYDROMORPHONE HCL PF 1 MG/ML IJ SOLN
INTRAMUSCULAR | Status: AC
  Filled 2011-02-01: qty 1

## 2011-02-01 MED ORDER — ACETAMINOPHEN 10 MG/ML IV SOLN
INTRAVENOUS | Status: DC | PRN
  Administered 2011-02-01: 1000 mg via INTRAVENOUS

## 2011-02-01 MED ORDER — PROPOFOL 10 MG/ML IV EMUL
INTRAVENOUS | Status: DC | PRN
  Administered 2011-02-01: 150 mg via INTRAVENOUS

## 2011-02-01 SURGICAL SUPPLY — 41 items
APPLIER CLIP 5 13 M/L LIGAMAX5 (MISCELLANEOUS) ×2
APPLIER CLIP ROT 10 11.4 M/L (STAPLE)
BENZOIN TINCTURE PRP APPL 2/3 (GAUZE/BANDAGES/DRESSINGS) ×2 IMPLANT
CANISTER SUCTION 2500CC (MISCELLANEOUS) ×2 IMPLANT
CHLORAPREP W/TINT 26ML (MISCELLANEOUS) ×2 IMPLANT
CLIP APPLIE 5 13 M/L LIGAMAX5 (MISCELLANEOUS) ×1 IMPLANT
CLIP APPLIE ROT 10 11.4 M/L (STAPLE) IMPLANT
CLOTH BEACON ORANGE TIMEOUT ST (SAFETY) ×2 IMPLANT
COVER MAYO STAND STRL (DRAPES) ×2 IMPLANT
COVER SURGICAL LIGHT HANDLE (MISCELLANEOUS) ×2 IMPLANT
DECANTER SPIKE VIAL GLASS SM (MISCELLANEOUS) ×2 IMPLANT
DRAPE C-ARM 42X72 X-RAY (DRAPES) ×2 IMPLANT
DRAPE LAPAROSCOPIC ABDOMINAL (DRAPES) ×2 IMPLANT
DRAPE UTILITY XL STRL (DRAPES) ×2 IMPLANT
DRSG TEGADERM 2-3/8X2-3/4 SM (GAUZE/BANDAGES/DRESSINGS) ×8 IMPLANT
ELECT REM PT RETURN 9FT ADLT (ELECTROSURGICAL) ×2
ELECTRODE REM PT RTRN 9FT ADLT (ELECTROSURGICAL) ×1 IMPLANT
ENDOLOOP SUT PDS II  0 18 (SUTURE)
ENDOLOOP SUT PDS II 0 18 (SUTURE) IMPLANT
GLOVE BIOGEL PI IND STRL 7.0 (GLOVE) ×1 IMPLANT
GLOVE BIOGEL PI INDICATOR 7.0 (GLOVE) ×1
GLOVE ECLIPSE 8.0 STRL XLNG CF (GLOVE) ×2 IMPLANT
GLOVE INDICATOR 8.0 STRL GRN (GLOVE) ×4 IMPLANT
GOWN STRL NON-REIN LRG LVL3 (GOWN DISPOSABLE) ×2 IMPLANT
GOWN STRL REIN XL XLG (GOWN DISPOSABLE) ×2 IMPLANT
HEMOSTAT SURGICEL 4X8 (HEMOSTASIS) IMPLANT
IV CATH 14GX2 1/4 (CATHETERS) IMPLANT
KIT BASIN OR (CUSTOM PROCEDURE TRAY) ×2 IMPLANT
NS IRRIG 1000ML POUR BTL (IV SOLUTION) ×2 IMPLANT
POUCH SPECIMEN RETRIEVAL 10MM (ENDOMECHANICALS) ×2 IMPLANT
SET CHOLANGIOGRAPH MIX (MISCELLANEOUS) ×2 IMPLANT
SET IRRIG TUBING LAPAROSCOPIC (IRRIGATION / IRRIGATOR) ×2 IMPLANT
SOLUTION ANTI FOG 6CC (MISCELLANEOUS) ×2 IMPLANT
STRIP CLOSURE SKIN 1/2X4 (GAUZE/BANDAGES/DRESSINGS) ×2 IMPLANT
SUT MNCRL AB 4-0 PS2 18 (SUTURE) ×2 IMPLANT
TOWEL OR 17X26 10 PK STRL BLUE (TOWEL DISPOSABLE) ×2 IMPLANT
TRAY LAP CHOLE (CUSTOM PROCEDURE TRAY) ×2 IMPLANT
TROCAR BLADELESS OPT 5 75 (ENDOMECHANICALS) ×6 IMPLANT
TROCAR XCEL BLUNT TIP 100MML (ENDOMECHANICALS) ×2 IMPLANT
TROCAR XCEL NON-BLD 11X100MML (ENDOMECHANICALS) ×2 IMPLANT
TUBING INSUFFLATION 10FT LAP (TUBING) ×2 IMPLANT

## 2011-02-01 NOTE — Progress Notes (Signed)
Day of Surgery  Subjective: Still with RUQ pain.  Objective: Vital signs in last 24 hours: Temp:  [97.5 F (36.4 C)-98.7 F (37.1 C)] 97.7 F (36.5 C) (11/07 0817) Pulse Rate:  [68-78] 73  (11/07 0817) Resp:  [16-20] 16  (11/07 0817) BP: (99-117)/(65-80) 110/79 mmHg (11/07 0817) SpO2:  [96 %-100 %] 97 % (11/07 0817) Last BM Date: 01/27/11  Intake/Output from previous day: 11/06 0701 - 11/07 0700 In: 2982 [P.O.:120; I.V.:2862] Out: 3150 [Urine:3150] Intake/Output this shift:    PE: Abd- RUQ tenderness.  Lab Results:   [facility] 01/31/11 0335  WBC 3.9*  HGB 10.7*  HCT 31.9*  PLT 299   BMET  Basename 01/31/11 0335  NA 135  K 4.0  CL 104  CO2 24  GLUCOSE 111*  BUN <3*  CREATININE 0.93  CALCIUM 8.7   PT/INR No results found for this basename: LABPROT:2,INR:2 in the last 72 hours ABG No results found for this basename: PHART:2,PCO2:2,PO2:2,HCO3:2 in the last 72 hours  Studies/Results: No results found.  Anti-infectives: Anti-infectives     Start     Dose/Rate Route Frequency Ordered Stop   01/27/11 1800   Ampicillin-Sulbactam (UNASYN) 3 g in sodium chloride 0.9 % 100 mL IVPB  Status:  Discontinued        3 g 100 mL/hr over 60 Minutes Intravenous 4 times per day 01/27/11 1737 01/28/11 1059          Assessment Principal Problem:  *Abdominal pain, epigastric-no improvement.    LOS: 6 days   Plan: To OR for lap chole today.   Lindey Renzulli J 02/01/2011

## 2011-02-01 NOTE — Anesthesia Postprocedure Evaluation (Signed)
  Anesthesia Post-op Note  Patient: Victoria George  Procedure(s) Performed:  LAPAROSCOPIC CHOLECYSTECTOMY WITH INTRAOPERATIVE CHOLANGIOGRAM - Needs IOC and C-arm  Patient Location: PACU  Anesthesia Type: General  Level of Consciousness: awake and alert   Airway and Oxygen Therapy: Patient Spontanous Breathing  Post-op Pain: mild  Post-op Assessment: Post-op Vital signs reviewed, Patient's Cardiovascular Status Stable, Respiratory Function Stable, Patent Airway and No signs of Nausea or vomiting  Post-op Vital Signs: stable  Complications: No apparent anesthesia complications

## 2011-02-01 NOTE — Anesthesia Preprocedure Evaluation (Addendum)
Anesthesia Evaluation  Patient identified by MRN, date of birth, ID band Patient awake    Reviewed: Allergy & Precautions, H&P , NPO status , Patient's Chart, lab work & pertinent test results  Airway Mallampati: II TM Distance: >3 FB Neck ROM: Full    Dental No notable dental hx.    Pulmonary neg pulmonary ROS,  clear to auscultation  Pulmonary exam normal       Cardiovascular hypertension, Pt. on medications neg cardio ROS Regular Normal    Neuro/Psych Negative Neurological ROS  Negative Psych ROS   GI/Hepatic negative GI ROS, Neg liver ROS,   Endo/Other  Negative Endocrine ROS  Renal/GU negative Renal ROS  Genitourinary negative   Musculoskeletal negative musculoskeletal ROS (+)   Abdominal (+) obese,   Peds negative pediatric ROS (+)  Hematology negative hematology ROS (+)   Anesthesia Other Findings   Reproductive/Obstetrics negative OB ROS                          Anesthesia Physical Anesthesia Plan  ASA: II  Anesthesia Plan: General   Post-op Pain Management:    Induction: Intravenous  Airway Management Planned: Oral ETT  Additional Equipment:   Intra-op Plan:   Post-operative Plan: Extubation in OR  Informed Consent: I have reviewed the patients History and Physical, chart, labs and discussed the procedure including the risks, benefits and alternatives for the proposed anesthesia with the patient or authorized representative who has indicated his/her understanding and acceptance.   Dental advisory given  Plan Discussed with: CRNA  Anesthesia Plan Comments:         Anesthesia Quick Evaluation

## 2011-02-01 NOTE — Op Note (Signed)
Preoperative diagnosis:  Chronic Cholecystitis    Postoperative diagnosis:  Same  Procedure: Laparoscopic cholecystectomy with cholangiogram and lysis of adhesions. Surgeon: Avel Peace, M.D.  Asst.:  Brayton El P.A.  Anesthesia: Gen.  Indication:   [name] is a 35 year old with RUQ pain suggestive of biliary colic.  [place] suggested small gallstones or sludge in gallbladder.  HIDA scan was negative for acute cholecystitis.  EGD did not show significant pathology.  She continues to have postprandial RUQ pain.  We discussed lap chole and she presents for that.  Procedure and risks were discussed with her.    Technique: The patient was brought to the operating room, placed supine on the operating table, and a general anesthetic was administered. The hair on the abdominal wall was clipped as was necessary. The abdominal wall was then sterilely prepped and draped. Local anesthetic (Marcaine) was infiltrated in the subumbilical region. A previous subumbilical scar was incised and scar tissue divided sharply down to the level of the fascia.  An incision was made in the fascia and more firm scar was encountered.  I decided to enter the peritoneal cavity under laparoscopic vision using a 5 mm optivue trocar and this was done through a RUQ incision  No underlying bleeding or organ injury was seen.  A pneumoperitoneum was created. A pursestring suture of 0 Vicryl was placed around the edges of the subumbilical incision fascia.  A 11mm trocar was then placed into the abdominal cavity through the subumbilical incision.  Two more trochars were then placed into the abdominal cavity under laparoscopic vision. One in the epigastric area, and one in the right upper quadrant area. The gallbladder was visualized and no acute inflammatory changes were noted. There were multiple thin adhesion between the anterior liver and abdominal wall which were divided sharply.  The fundus was then grasped and retracted toward  the right shoulder.  The infundibulum was mobilized with dissection close to the gallbladder and retracted laterally. The cystic duct was identified and a window was created around it. The cystic artery was also identified and a window was created around it. The critical view was achieved. A clip was placed at the neck of the gallbladder. A small incision was made in the cystic duct. A cholangiocatheter was introduced through the anterior abdominal wall and placed in the cystic duct. A intraoperative cholangiogram was then performed.  Under real-time fluoroscopy, dilute contrast was injected into the cystic duct.  The common hepatic duct, the right and left hepatic ducts, and the common duct were all visualized. Contrast drained into the duodenum without obvious evidence of any obstructing ductal lesion. The final report is pending the Radiologist's interpretation.  The cholangiocatheter was removed, the cystic duct was clipped 3 times on the biliary side, and then the cystic duct was divided sharply. No bile leak was noted from the cystic duct stump.  The cystic artery was then clipped and divided. Following this the gallbladder was dissected free from the liver using electrocautery.  There was some leakage of bile from a small puncture wound in the gallbladder. The gallbladder was then placed in a retrieval bag and removed from the abdominal cavity through the subumbilical incision.  The gallbladder fossa was inspected, irrigated, and bleeding was controlled with electrocautery. Inspection showed that hemostasis was adequate and there was no evidence of bile leak.  The irrigation fluid was evacuated as much as possible.  The subumbilical trocar was removed and the fascial defect was closed by tightening  and tying down the pursestring suture under laparoscopic vision.  The remaining trochars were removed and the pneumoperitoneum was released. The skin incisions were closed with 4-0 Monocryl subcuticular  stitches. Steri-Strips and sterile dressings were applied.  The procedure was well-tolerated without any apparent complications. The patient was taken to the recovery room in satisfactory condition.

## 2011-02-01 NOTE — Transfer of Care (Signed)
Immediate Anesthesia Transfer of Care Note  Patient: Victoria George  Procedure(s) Performed:  LAPAROSCOPIC CHOLECYSTECTOMY WITH INTRAOPERATIVE CHOLANGIOGRAM - Needs IOC and C-arm  Patient Location: PACU  Anesthesia Type: General  Level of Consciousness: awake, sedated, patient cooperative and responds to stimulation  Airway & Oxygen Therapy: Patient Spontanous Breathing and Patient connected to face mask oxygen  Post-op Assessment: Report given to PACU RN, Post -op Vital signs reviewed and stable and Patient moving all extremities  Post vital signs: Reviewed and stable  Complications: No apparent anesthesia complications

## 2011-02-02 MED ORDER — NYSTATIN 100000 UNIT/GM EX CREA
TOPICAL_CREAM | Freq: Two times a day (BID) | CUTANEOUS | Status: DC
  Administered 2011-02-02 – 2011-02-03 (×2): via TOPICAL
  Filled 2011-02-02: qty 15

## 2011-02-02 MED ORDER — HYDROCHLOROTHIAZIDE 25 MG PO TABS
25.0000 mg | ORAL_TABLET | Freq: Every day | ORAL | Status: DC
Start: 2011-02-02 — End: 2011-02-03
  Administered 2011-02-02 – 2011-02-03 (×2): 25 mg via ORAL
  Filled 2011-02-02 (×2): qty 1

## 2011-02-02 MED ORDER — MICONAZOLE NITRATE 100 MG VA SUPP
100.0000 mg | Freq: Every day | VAGINAL | Status: DC
  Administered 2011-02-02: 100 mg via VAGINAL
  Filled 2011-02-02: qty 7

## 2011-02-02 NOTE — Progress Notes (Signed)
1 Day Post-Op  Subjective: Feels better. Eating without pain or nausea.  Objective: Vital signs in last 24 hours: Temp:  [97.5 F (36.4 C)-98.4 F (36.9 C)] 97.9 F (36.6 C) (11/08 0543) Pulse Rate:  [65-96] 96  (11/08 0543) Resp:  [13-20] 18  (11/08 0543) BP: (114-142)/(73-94) 120/83 mmHg (11/08 0543) SpO2:  [98 %-100 %] 99 % (11/08 0543) Last BM Date: 01/27/11  Intake/Output from previous day: 11/07 0701 - 11/08 0700 In: 1400 [I.V.:1400] Out: 1310 [Urine:1300; Blood:10] Intake/Output this shift:    PE: Abd- soft, dressings dry. Lab Results:   [facility] 01/31/11 0335  WBC 3.9*  HGB 10.7*  HCT 31.9*  PLT 299   BMET  Basename 01/31/11 0335  NA 135  K 4.0  CL 104  CO2 24  GLUCOSE 111*  BUN <3*  CREATININE 0.93  CALCIUM 8.7   PT/INR No results found for this basename: LABPROT:2,INR:2 in the last 72 hours ABG No results found for this basename: PHART:2,PCO2:2,PO2:2,HCO3:2 in the last 72 hours  Studies/Results: Dg Cholangiogram Operative  02/01/2011  *RADIOLOGY REPORT*  Clinical Data:   Right upper quadrant pain  INTRAOPERATIVE CHOLANGIOGRAM  Comparison: Ultrasound 01/26/2011.  CT 01/27/2011.  Findings: Intraoperative spot images show normal caliber biliary system.  No evidence of retained stone or obstruction.  Free passage of contrast into the small bowel noted.  IMPRESSION: No evidence for retained stone or obstruction.  These images were submitted for radiologic interpretation only. Please see the procedural report for the amount of contrast and the fluoroscopy time utilized.  Original Report Authenticated By: Cyndie Chime, M.D.    Anti-infectives: Anti-infectives     Start     Dose/Rate Route Frequency Ordered Stop   02/01/11 0500   ceFAZolin (ANCEF) IVPB 1 g/50 mL premix        1 g 100 mL/hr over 30 Minutes Intravenous 60 min pre-op 02/01/11 0452 02/01/11 0935   01/27/11 1800   Ampicillin-Sulbactam (UNASYN) 3 g in sodium chloride 0.9 % 100 mL IVPB   Status:  Discontinued        3 g 100 mL/hr over 60 Minutes Intravenous 4 times per day 01/27/11 1737 01/28/11 1059          Assessment Principal Problem:  *Abdominal pain, epigastric-s/p lap chole and loa; sxs improved.    LOS: 7 days   Plan: Advance diet. If tolerated, possible discharge later today or tomorrow.  Melitta Tigue J 02/02/2011

## 2011-02-03 MED ORDER — FLUCONAZOLE 150 MG PO TABS
150.0000 mg | ORAL_TABLET | Freq: Once | ORAL | Status: AC
  Administered 2011-02-03: 150 mg via ORAL
  Filled 2011-02-03: qty 1

## 2011-02-03 MED ORDER — OXYCODONE-ACETAMINOPHEN 5-325 MG PO TABS: 1.0000 | ORAL_TABLET | ORAL | Status: AC | PRN

## 2011-02-03 NOTE — Discharge Summary (Signed)
Physician Discharge Summary  Patient ID: Victoria George MRN: [id number] DOB/AGE: [birth year] 35 y.o.  Admit date: 01/26/2011 Discharge date: 02/03/2011  Admission Diagnoses: Abdominal pain Discharge Diagnoses:  Principal Problem:  *Abdominal pain, epigastric Biliary dyskinesia  Discharged Condition: good  Hospital Course: Pt admitted on 11/1 with c/o upper abd pain. Initial imaging found possible shadowing in gb, perhaps c/w gallstones, however her hx was not straight forward gb disease. She was seen and admitted by Dr. Zachery Dakins and a HIDA scan ordered, this was negative. She then had a CT scan ordered which again did not find any acute abnormality. GI was consulted as the pt continued to have pain and nausea, mostly post-prandial. The EGD did not find any acute PUD or gastritis. She was given a trial of diet, which she failed. The decision was then made to take the pt to the OR on 11-7 for laparoscopy and cholecystectomy. 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 Post op she has made steady progress and her sxs have essentially resolved. She is now tolerating diet without nausea. Her only pain is that of surgery.  Consults: GI  Significant Diagnostic Studies: endoscopy: gastroscopy: negative  Treatments: surgery: lap chole 11-7   Discharge Exam: Blood pressure 114/79, pulse 100, temperature 97.7 F (36.5 C), temperature source Oral, resp. rate 20, height 5\' 1"  (1.549 m), weight 180 lb (81.647 kg), last menstrual period 01/22/2011, SpO2 98.00%. Lungs: CTA without w/r/r Heart: Regular Abdomen: soft, ND, appropriately tender   Incisions all c/d/i without erythema or hematoma. Ext: No edema or tenderness   Disposition: Home or Self Care  Discharge Orders    Future Orders Please Complete By Expires   Diet general      Comments:   Avoid foods high in fat content   Increase activity slowly      Discharge instructions      Comments:     [facility] ______[facility] LAPAROSCOPIC SURGERY: POST OP INSTRUCTIONS Always review your discharge instruction sheet given to you by the facility where your surgery was performed. IF YOU HAVE DISABILITY OR FAMILY LEAVE FORMS, YOU MUST BRING THEM TO THE OFFICE FOR PROCESSING.   DO NOT GIVE THEM TO YOUR DOCTOR.  A prescription for pain medication may be given to you upon discharge.  Take your pain medication as prescribed, if needed.  If narcotic pain medicine is not needed, then you may take acetaminophen (Tylenol) or ibuprofen (Advil) as needed. Take your usually prescribed medications unless otherwise directed. If you need a refill on your pain medication, please contact your pharmacy.  They will contact our office to request authorization. Prescriptions will not be filled after 5pm or on week-ends. You should follow a light diet the first few days after arrival home, such as soup and crackers, etc.  Be sure to include lots of fluids daily. Most patients will experience some swelling and bruising in the area of the incisions.  Ice packs will help.  Swelling and bruising can take several days to resolve.  It is common to experience some constipation if taking pain medication after surgery.  Increasing fluid intake and taking a stool softener (such as Colace) will usually help or prevent this problem from occurring.  A mild laxative (Milk of Magnesia or Miralax) should be taken according to package instructions if there are no bowel movements after 48 hours. Unless discharge instructions indicate otherwise, you may remove your bandages 24-48 hours after surgery, and  you may shower at that time.  You may have steri-strips (small skin tapes) in place directly over the incision.  These strips should be left on the skin for 7-10 days.  If your surgeon used skin glue on the incision, you may shower in 24 hours.  The glue will flake off over the next 2-3 weeks.  Any sutures or staples will be  removed at the office during your follow-up visit. ACTIVITIES:  You may resume regular (light) daily activities beginning the next day-such as daily self-care, walking, climbing stairs-gradually increasing activities as tolerated.  You may have sexual intercourse when it is comfortable.  Refrain from any heavy lifting or straining until approved by your doctor. You may drive when you are no longer taking prescription pain medication, you can comfortably wear a seatbelt, and you can safely maneuver your car and apply brakes. RETURN TO WORK:  __________________________________________________________ Victoria George should see your doctor in the office for a follow-up appointment approximately 2-3 weeks after your surgery.  Make sure that you call for this appointment within a day or two after you arrive home to insure a convenient appointment time. OTHER INSTRUCTIONS: __________________________________________________________________________________________________________________________ __________________________________________________________________________________________________________________________ WHEN TO CALL YOUR DOCTOR: Fever over 101.0 Inability to urinate Continued bleeding from incision. Increased pain, redness, or drainage from the incision. Increasing abdominal pain  The clinic staff is available to answer your questions during regular business hours.  Please don't hesitate to call and ask to speak to one of the nurses for clinical concerns.  If you have a medical emergency, go to the nearest emergency room or call 911.  A surgeon from [facility] is always on call at the hospital. [address], [place], [place]  [address] ? [address] [address], [place], [place]   [address] [phone] ? [phone] ? FAX [phone] Web site: [email or web address]    Lifting restrictions      Comments:   No heavy liftin more than 10lbs for 2 weeks.   Other  Restrictions      Comments:   May return to work as of the week of 02-13-11   Remove dressing in 24 hours      May shower / Bathe      Comments:   May shower, no tub baths   May walk up steps      Call MD for:  temperature >100.4      Call MD for:  persistant nausea and vomiting      Call MD for:  severe uncontrolled pain      Call MD for:  redness, tenderness, or signs of infection (pain, swelling, redness, odor or green/yellow discharge around incision site)        Current Discharge Medication List    START taking these medications   Details  oxyCODONE-acetaminophen (ROXICET) 5-325 MG per tablet Take 1 tablet by mouth every 4 (four) hours as needed for pain. Qty: 30 tablet, Refills: 0      CONTINUE these medications which have NOT CHANGED   Details  hydrochlorothiazide (HYDRODIURIL) 25 MG tablet Take 25 mg by mouth daily.      ibuprofen (ADVIL,MOTRIN) 200 MG tablet Take 200 mg by mouth every 8 (eight) hours as needed. For pain     phentermine 37.5 MG capsule Take 18.75 mg by mouth daily.        Follow-up Information    Follow up with Marianna Fuss, PA on 02/21/2011. (1:30pm)    Contact information:   [facility] [address], [address] [place] [address] [phone]          Signed: Marianna Fuss 02/03/2011, 10:43 AM

## 2011-02-03 NOTE — Progress Notes (Signed)
2 Days Post-Op  Subjective: Finally feeling much better. No N/V. Mild pain well controlled. Has been OOB. Wants to go home.  Objective: Vital signs in last 24 hours: Temp:  [97.7 F (36.5 C)-98.7 F (37.1 C)] 97.7 F (36.5 C) (11/09 0951) Pulse Rate:  [75-100] 100  (11/09 0951) Resp:  [16-20] 20  (11/09 0951) BP: (98-125)/(64-86) 114/79 mmHg (11/09 0951) SpO2:  [90 %-99 %] 98 % (11/09 0951) Last BM Date: 01/27/11  Intake/Output this shift: Total I/O In: 120 [P.O.:120] Out: -   Physical Exam: Lungs: CTA without w/r/r Heart: Regular Abdomen: soft, ND, appropriately tender   Incisions all c/d/i without erythema or hematoma. Ext: No edema or tenderness   Studies/Results: Dg Cholangiogram Operative  02/01/2011  *RADIOLOGY REPORT*  Clinical Data:   Right upper quadrant pain  INTRAOPERATIVE CHOLANGIOGRAM  Comparison: Ultrasound 01/26/2011.  CT 01/27/2011.  Findings: Intraoperative spot images show normal caliber biliary system.  No evidence of retained stone or obstruction.  Free passage of contrast into the small bowel noted.  IMPRESSION: No evidence for retained stone or obstruction.  These images were submitted for radiologic interpretation only. Please see the procedural report for the amount of contrast and the fluoroscopy time utilized.  Original Report Authenticated By: Cyndie Chime, M.D.     Assessment: Principal Problem:  *Abdominal pain, epigastric s/p Procedure(s): LAPAROSCOPIC CHOLECYSTECTOMY WITH INTRAOPERATIVE CHOLANGIOGRAM  Plan: Ok to DC home  LOS: 8 days    Marianna Fuss 02/03/2011

## 2011-02-06 ENCOUNTER — Encounter (HOSPITAL_COMMUNITY): Payer: Self-pay | Admitting: General Surgery

## 2011-02-15 ENCOUNTER — Encounter (HOSPITAL_COMMUNITY): Payer: Self-pay | Admitting: Internal Medicine

## 2011-02-21 ENCOUNTER — Encounter (INDEPENDENT_AMBULATORY_CARE_PROVIDER_SITE_OTHER): Payer: Self-pay

## 2011-02-21 ENCOUNTER — Encounter (INDEPENDENT_AMBULATORY_CARE_PROVIDER_SITE_OTHER): Payer: Self-pay | Admitting: Radiology

## 2011-02-21 ENCOUNTER — Ambulatory Visit (INDEPENDENT_AMBULATORY_CARE_PROVIDER_SITE_OTHER): Payer: Self-pay | Admitting: Radiology

## 2011-02-21 VITALS — BP 122/80 | HR 84 | Resp 16 | Ht 61.0 in | Wt 178.8 lb

## 2011-02-21 DIAGNOSIS — K828 Other specified diseases of gallbladder: Secondary | ICD-10-CM

## 2011-02-21 NOTE — Progress Notes (Signed)
Victoria George [birth year] [id number] 02/21/2011   Victoria George is a 35 y.o. female who had a laparoscopic cholecystectomy with intraoperative cholangiogram.  The pathology report confirmed mild cholecystitis.  The patient reports that they are feeling well with normal bowel movements and good appetite.  The pre-operative symptoms of abdominal pain, nausea, and vomiting have resolved.    Physical examination - Incisions appear well-healed with no sign of infection or bleeding.   Abdomen - soft, non-tender  Impression:  s/p laparoscopic cholecystectomy  Plan:  She may resume a regular diet and full activity and work as of 11/28.  She may follow-up on a PRN basis.

## 2011-02-23 ENCOUNTER — Emergency Department: Payer: Self-pay | Admitting: Unknown Physician Specialty

## 2011-02-24 ENCOUNTER — Telehealth (INDEPENDENT_AMBULATORY_CARE_PROVIDER_SITE_OTHER): Payer: Self-pay | Admitting: General Surgery

## 2011-02-24 ENCOUNTER — Encounter (HOSPITAL_COMMUNITY): Payer: Self-pay | Admitting: Emergency Medicine

## 2011-02-24 ENCOUNTER — Emergency Department (HOSPITAL_COMMUNITY)
Admission: EM | Admit: 2011-02-24 | Discharge: 2011-02-24 | Disposition: A | Discharge status: Home / Self Care | Payer: Self-pay | Attending: Emergency Medicine | Admitting: Emergency Medicine

## 2011-02-24 ENCOUNTER — Encounter (INDEPENDENT_AMBULATORY_CARE_PROVIDER_SITE_OTHER): Payer: Self-pay | Admitting: General Surgery

## 2011-02-24 ENCOUNTER — Emergency Department (HOSPITAL_COMMUNITY): Payer: Self-pay

## 2011-02-24 DIAGNOSIS — Z9889 Other specified postprocedural states: Secondary | ICD-10-CM | POA: Insufficient documentation

## 2011-02-24 DIAGNOSIS — Z79899 Other long term (current) drug therapy: Secondary | ICD-10-CM | POA: Insufficient documentation

## 2011-02-24 DIAGNOSIS — R110 Nausea: Secondary | ICD-10-CM | POA: Insufficient documentation

## 2011-02-24 DIAGNOSIS — R10819 Abdominal tenderness, unspecified site: Secondary | ICD-10-CM | POA: Insufficient documentation

## 2011-02-24 DIAGNOSIS — R109 Unspecified abdominal pain: Secondary | ICD-10-CM | POA: Insufficient documentation

## 2011-02-24 DIAGNOSIS — N898 Other specified noninflammatory disorders of vagina: Secondary | ICD-10-CM | POA: Insufficient documentation

## 2011-02-24 DIAGNOSIS — I10 Essential (primary) hypertension: Secondary | ICD-10-CM | POA: Insufficient documentation

## 2011-02-24 DIAGNOSIS — R1013 Epigastric pain: Secondary | ICD-10-CM

## 2011-02-24 LAB — LIPASE, BLOOD: Lipase: 35 U/L (ref 11–59)

## 2011-02-24 LAB — URINALYSIS, ROUTINE W REFLEX MICROSCOPIC
Bilirubin Urine: NEGATIVE
Glucose, UA: NEGATIVE mg/dL
Ketones, ur: 15 mg/dL — AB
Nitrite: NEGATIVE
Protein, ur: 30 mg/dL — AB
Specific Gravity, Urine: 1.022 (ref 1.005–1.030)
Urobilinogen, UA: 1 mg/dL (ref 0.0–1.0)
pH: 7 (ref 5.0–8.0)

## 2011-02-24 LAB — DIFFERENTIAL
Basophils Absolute: 0 10*3/uL (ref 0.0–0.1)
Basophils Relative: 1 % (ref 0–1)
Eosinophils Absolute: 0.1 10*3/uL (ref 0.0–0.7)
Eosinophils Relative: 3 % (ref 0–5)
Lymphocytes Relative: 55 % — ABNORMAL HIGH (ref 12–46)
Lymphs Abs: 2.2 K/uL (ref 0.7–4.0)
Monocytes Absolute: 0.4 K/uL (ref 0.1–1.0)
Monocytes Relative: 10 % (ref 3–12)
Neutro Abs: 1.2 10*3/uL — ABNORMAL LOW (ref 1.7–7.7)
Neutrophils Relative %: 32 % — ABNORMAL LOW (ref 43–77)

## 2011-02-24 LAB — HEPATIC FUNCTION PANEL
ALT: 18 U/L (ref 0–35)
AST: 24 U/L (ref 0–37)
Albumin: 3.6 g/dL (ref 3.5–5.2)
Alkaline Phosphatase: 75 U/L (ref 39–117)
Bilirubin, Direct: <0.1 mg/dL (ref 0.0–0.3)
Total Bilirubin: 0.2 mg/dL — ABNORMAL LOW (ref 0.3–1.2)
Total Protein: 8.1 g/dL (ref 6.0–8.3)

## 2011-02-24 LAB — CBC
HCT: 34.8 % — ABNORMAL LOW (ref 36.0–46.0)
Hemoglobin: 11.5 g/dL — ABNORMAL LOW (ref 12.0–15.0)
MCH: 28.3 pg (ref 26.0–34.0)
MCHC: 33 g/dL (ref 30.0–36.0)
MCV: 85.7 fL (ref 78.0–100.0)
Platelets: 292 10*3/uL (ref 150–400)
RBC: 4.06 MIL/uL (ref 3.87–5.11)
RDW: 15 % (ref 11.5–15.5)
WBC: 3.9 K/uL — ABNORMAL LOW (ref 4.0–10.5)

## 2011-02-24 LAB — BASIC METABOLIC PANEL WITH GFR
BUN: 11 mg/dL (ref 6–23)
Creatinine, Ser: 0.94 mg/dL (ref 0.50–1.10)
GFR calc Af Amer: 89 mL/min — ABNORMAL LOW (ref >90)
GFR calc non Af Amer: 77 mL/min — ABNORMAL LOW (ref >90)
Glucose, Bld: 80 mg/dL (ref 70–99)

## 2011-02-24 LAB — URINE MICROSCOPIC-ADD ON

## 2011-02-24 LAB — BASIC METABOLIC PANEL
CO2: 29 mEq/L (ref 19–32)
Calcium: 9.3 mg/dL (ref 8.4–10.5)
Chloride: 102 mEq/L (ref 96–112)
Potassium: 3 mEq/L — ABNORMAL LOW (ref 3.5–5.1)
Sodium: 141 mEq/L (ref 135–145)

## 2011-02-24 MED ORDER — ONDANSETRON HCL 4 MG/2ML IJ SOLN
4.0000 mg | Freq: Once | INTRAMUSCULAR | Status: AC
  Administered 2011-02-24: 4 mg via INTRAVENOUS
  Filled 2011-02-24: qty 2

## 2011-02-24 MED ORDER — SODIUM CHLORIDE 0.9 % IV SOLN: Freq: Once | INTRAVENOUS | Status: DC

## 2011-02-24 MED ORDER — HYDROMORPHONE HCL PF 1 MG/ML IJ SOLN
1.0000 mg | Freq: Once | INTRAMUSCULAR | Status: AC
  Administered 2011-02-24: 1 mg via INTRAVENOUS
  Filled 2011-02-24: qty 1

## 2011-02-24 MED ORDER — IOHEXOL 300 MG/ML  SOLN
100.0000 mL | Freq: Once | INTRAMUSCULAR | Status: AC | PRN
  Administered 2011-02-24: 100 mL via INTRAVENOUS

## 2011-02-24 NOTE — ED Provider Notes (Signed)
5:43 PM Patient care resumed from [facility] been waiting in.  Plan is for patient to get a CT of her abdomen and consult by surgery.  Patient was reevaluated and is not currently having abdominal pain.  She is alert oriented x3.  Heart regular rate and rhythm.  Patient in no acute distress.  Vital signs reviewed and patient is hemodynamically stable.  Spoke with Dr. Cleophas Dunker who was unaware that anyone form his practice was called earlier to see the patient.  Dr. Oletta Lamas had spoken earlier with someone from the practice that stated they would be coming by to see the patient.  However when this did not happen I re\re paged her practice and explained the results of the normal lipase and normal CT.  Dr. Cleophas Dunker stated that he was comfortable following up with the patient in his office next week and he recommended that she also followup with a gastroenterologist to look for other possible causes of her abdominal pain.  Patient will be discharged home with directions for followup and she states she did not mean pain medications and she has plenty at home.  Patient is currently hemodynamically stable and has no complaints  [place], [place] 02/24/11 2059

## 2011-02-24 NOTE — ED Notes (Signed)
Pt has returned from xray

## 2011-02-24 NOTE — ED Notes (Signed)
Patient transported to CT 

## 2011-02-24 NOTE — ED Provider Notes (Signed)
History     CSN: [id number] Arrival date & time: 02/24/2011  1:51 PM   First MD Initiated Contact with Patient 02/24/11 1414      Chief Complaint  Patient presents with  . Abdominal Pain    (Consider location/radiation/quality/duration/timing/severity/associated sxs/prior treatment) HPI Comments: Patient is approximately 3 weeks out from a cholecystectomy by Dr. Abbey Chatters. She was healing nicely and really wasn't even requiring pain medication of her last week. She started work 3 days ago and went to work on Wednesday and Thursday. Yesterday she developed right upper quadrant and flank pain that we became very severe and associated nausea. She had to leave work and went to [facility] where they did blood tests and an ultrasound. No significant findings were found and they let her go home with a prescription for medication. She did call and make an appointment to see Dr. Kae Heller R. and has appointment at 3 PM today. However the pain was so severe she felt she could not wait and instead came here to the emergency department. Level 5 caveat due to pain severity and urgent need for intervention. She reports her last menstrual cycle began 4 days ago and she reports that she has not had intercourse since around October. She reports she has had a negative urine test recently prior to her surgery and knows that she is not pregnant.  Patient is a 35 y.o. female presenting with abdominal pain. The history is provided by the patient.  Abdominal Pain The primary symptoms of the illness include abdominal pain, nausea and vaginal bleeding. The primary symptoms of the illness do not include vomiting, diarrhea, dysuria or vaginal discharge. The onset of the illness was sudden.    Past Medical History  Diagnosis Date  . Hypertension     Past Surgical History  Procedure Date  . Diagnostic laparoscopy   . Cholecystectomy 02/01/2011    Procedure: LAPAROSCOPIC CHOLECYSTECTOMY WITH  INTRAOPERATIVE CHOLANGIOGRAM;  Surgeon: Adolph Pollack, MD;  Location: [facility];  Service: General;  Laterality: N/A;  Needs IOC and C-arm  . Esophagogastroduodenoscopy 01/30/2011    Procedure: ESOPHAGOGASTRODUODENOSCOPY (EGD);  Surgeon: Erick Blinks, MD;  Location: [facility];  Service: Gastroenterology;  Laterality: N/A;    Family History  Problem Relation Age of Onset  . Hypotension Mother   . Anesthesia problems Neg Hx   . Malignant hyperthermia Neg Hx   . Pseudochol deficiency Neg Hx     History  Substance Use Topics  . Smoking status: Never Smoker   . Smokeless tobacco: Not on file  . Alcohol Use: No     occational    OB History    Grav Para Term Preterm Abortions TAB SAB Ect Mult Living   0               Review of Systems  Unable to perform ROS: Other  Gastrointestinal: Positive for nausea and abdominal pain. Negative for vomiting and diarrhea.  Genitourinary: Positive for vaginal bleeding. Negative for dysuria and vaginal discharge.    Allergies  Shellfish allergy; Hydrocodone; and Phenergan  Home Medications   Current Outpatient Rx  Name Route Sig Dispense Refill  . HYDROCHLOROTHIAZIDE 25 MG PO TABS Oral Take 25 mg by mouth daily.      . IBUPROFEN 200 MG PO TABS Oral Take 200 mg by mouth every 8 (eight) hours as needed. For pain     . ONDANSETRON HCL 4 MG PO TABS Oral Take 4 mg by mouth every 8 (eight)  hours as needed. For nausea     . OXYCODONE-ACETAMINOPHEN 5-325 MG PO TABS Oral Take 1 tablet by mouth every 6 (six) hours as needed. For pain       BP 120/77  Pulse 99  Temp(Src) 98 F (36.7 C) (Oral)  Resp 21  SpO2 98%  LMP 01/22/2011  Physical Exam  Nursing note and vitals reviewed. Constitutional: She is oriented to person, place, and time. She appears well-developed and well-nourished. She appears distressed.  HENT:  Head: Normocephalic.  Eyes: Pupils are equal, round, and reactive to light. No scleral icterus.  Neck: Neck supple.    Pulmonary/Chest: Effort normal.  Abdominal: Soft. There is tenderness. There is guarding.  Neurological: She is alert and oriented to person, place, and time.  Skin: Skin is warm and dry. No rash noted. She is not diaphoretic.    ED Course  Procedures (including critical care time)   Labs Reviewed  CBC  DIFFERENTIAL  LIPASE, BLOOD  HEPATIC FUNCTION PANEL  BASIC METABOLIC PANEL  URINALYSIS, ROUTINE W REFLEX MICROSCOPIC   No results found.   No diagnosis found.    MDM    Patient is rocking back and forth and appears to have colicky pain. I reviewed blood tests and ultrasound sent from [place] here. She did no elevated white count and her basic metabolic panel was normal. I do not see results from a urinalysis or a lipase. She also had a negative troponin as well. The ultrasound shows that she had a fluid collection in the gallbladder fossa. Patient here is in obvious discomfort, however does not appear toxic. Her vital signs are otherwise normal except for tachypnea which is likely secondary to her severe pain. My plan is to establish an IV, start some gentle IV fluid rehydration, give her IV analgesics and antiemetics and contact Dr. Kae Heller R. to let him know that she is here in the emergency department. She can be moved to the CDU for holding to await consultation by [facility] surgery       Gavin Pound. Oletta Lamas, MD 02/25/11 [phone]

## 2011-02-24 NOTE — ED Notes (Signed)
Pt c/o right upper abd pain starting yesterday; pt sts was seen at [facility] yesterday and had ultrasound and was discharged; pt sts pain today with nausea

## 2011-02-24 NOTE — ED Notes (Signed)
Pt has finished ct prep

## 2011-02-25 NOTE — ED Provider Notes (Signed)
Medical screening examination/treatment/procedure(s) were performed by non-physician practitioner and as supervising physician I was immediately available for consultation/collaboration.  Adriena Manfre P Breon Diss, MD 02/25/11 0721 

## 2011-02-27 ENCOUNTER — Telehealth (INDEPENDENT_AMBULATORY_CARE_PROVIDER_SITE_OTHER): Payer: Self-pay

## 2011-02-27 NOTE — Telephone Encounter (Signed)
LMOM for the pt to call to set up her appt with Dr. Abbey Chatters.  Reminded her again to bring all labs and imaging results with her to the appt.

## 2011-02-27 NOTE — Telephone Encounter (Signed)
Pt calling in needing an appt with Dr Abbey Chatters ASAP for pain after lap chole. The pt has gone to [facility], [facility], and [facility] ER trying to figure out this pain. The pt did have an appt for urgent office on 02-24-11 but cx'd the appt to go to [facility] ER where they did a CT and labs on her. The pt went to [facility] ER the next day but they only did labs no xrays. I advised the pt that Cyndra Numbers would discuss the info with Dr Abbey Chatters and get back with her on the appt. I did advise pt that she would need to get all of her med.rec's pertaining to this from [facility] and [place] to show Dr Abbey Chatters [facility]

## 2011-02-27 NOTE — Telephone Encounter (Signed)
Patient to call and confirm that she is leaving right now to [facility] to pick up her records, if you could please call her back with the date and time of her appt.  If she does not answer please leave that information on her voicemail.

## 2011-02-28 ENCOUNTER — Ambulatory Visit (INDEPENDENT_AMBULATORY_CARE_PROVIDER_SITE_OTHER): Payer: Self-pay | Admitting: General Surgery

## 2011-02-28 VITALS — BP 122/78 | HR 96 | Temp 96.0°F | Resp 14 | Ht 61.0 in | Wt 183.0 lb

## 2011-02-28 DIAGNOSIS — Z9889 Other specified postprocedural states: Secondary | ICD-10-CM

## 2011-02-28 MED ORDER — ONDANSETRON HCL 4 MG PO TABS: 4.0000 mg | ORAL_TABLET | Freq: Three times a day (TID) | ORAL | Status: DC | PRN

## 2011-02-28 NOTE — Patient Instructions (Signed)
Avoid abrupt twisting or bending.  Use Aleve twice a day as needed.  Call us back in two weeks if you are not better.

## 2011-02-28 NOTE — Progress Notes (Signed)
She is here for another postoperative visit. She was doing well until before the weekend when she developed acute sharp pain in the right upper quadrant with nausea while at work. She went to the [facility] where an ultrasound was performed and reportedly demonstrated a small amount of fluid in the gallbladder fossa. Her blood tests were within normal limits. She was discharged from there and went back to [facility] emergency department November 30 with similar pain. A CT scan there was normal. Liver function tests and CBC were normal. Her pain is somewhat better but she still has her nausea. She's on crackers and ginger ale. No fever or chills.  Exam:  Gen-Well-developed, well-nourished, in no acute distress.  Abdomen-soft, well-healed incisions; the medial 5 mm incision has some ecchymosis and a tender nodule deep to it.  Palpation of this reproduces her pain.  There is no erythema present.  Assessment: Musculoskeletal pain/hematoma that has occurred recently following laparoscopic cholecystectomy. She was doing well for approximately 3-4 weeks.  Plan: Moist heat to the area.  Avoid abrupt twisting or bending. Aleve twice a day. We'll give her Zofran for her nausea. She is to call back if this is persisting after 2 weeks.

## 2011-03-01 ENCOUNTER — Encounter: Payer: Self-pay | Admitting: Internal Medicine

## 2011-03-02 ENCOUNTER — Telehealth (INDEPENDENT_AMBULATORY_CARE_PROVIDER_SITE_OTHER): Payer: Self-pay

## 2011-03-02 ENCOUNTER — Observation Stay (HOSPITAL_COMMUNITY)
Admission: EM | Admit: 2011-03-02 | Discharge: 2011-03-05 | Disposition: A | Discharge status: Home / Self Care | Payer: Self-pay | Attending: Internal Medicine | Admitting: Internal Medicine

## 2011-03-02 ENCOUNTER — Encounter (HOSPITAL_COMMUNITY): Payer: Self-pay

## 2011-03-02 ENCOUNTER — Observation Stay (HOSPITAL_COMMUNITY): Payer: Self-pay

## 2011-03-02 DIAGNOSIS — O219 Vomiting of pregnancy, unspecified: Secondary | ICD-10-CM | POA: Diagnosis present

## 2011-03-02 DIAGNOSIS — Z79899 Other long term (current) drug therapy: Secondary | ICD-10-CM | POA: Insufficient documentation

## 2011-03-02 DIAGNOSIS — K2970 Gastritis, unspecified, without bleeding: Secondary | ICD-10-CM | POA: Diagnosis present

## 2011-03-02 DIAGNOSIS — R1110 Vomiting, unspecified: Secondary | ICD-10-CM

## 2011-03-02 DIAGNOSIS — K2960 Other gastritis without bleeding: Principal | ICD-10-CM | POA: Insufficient documentation

## 2011-03-02 DIAGNOSIS — R109 Unspecified abdominal pain: Secondary | ICD-10-CM | POA: Insufficient documentation

## 2011-03-02 DIAGNOSIS — D649 Anemia, unspecified: Secondary | ICD-10-CM | POA: Insufficient documentation

## 2011-03-02 DIAGNOSIS — R1013 Epigastric pain: Secondary | ICD-10-CM | POA: Insufficient documentation

## 2011-03-02 DIAGNOSIS — Z9089 Acquired absence of other organs: Secondary | ICD-10-CM | POA: Insufficient documentation

## 2011-03-02 DIAGNOSIS — E876 Hypokalemia: Secondary | ICD-10-CM | POA: Insufficient documentation

## 2011-03-02 DIAGNOSIS — D259 Leiomyoma of uterus, unspecified: Secondary | ICD-10-CM | POA: Insufficient documentation

## 2011-03-02 DIAGNOSIS — I10 Essential (primary) hypertension: Secondary | ICD-10-CM

## 2011-03-02 DIAGNOSIS — M7918 Myalgia, other site: Secondary | ICD-10-CM | POA: Diagnosis present

## 2011-03-02 DIAGNOSIS — R0789 Other chest pain: Secondary | ICD-10-CM | POA: Insufficient documentation

## 2011-03-02 DIAGNOSIS — R112 Nausea with vomiting, unspecified: Secondary | ICD-10-CM | POA: Insufficient documentation

## 2011-03-02 HISTORY — DX: Essential (primary) hypertension: I10

## 2011-03-02 HISTORY — DX: Anemia, unspecified: D64.9

## 2011-03-02 HISTORY — DX: Vomiting of pregnancy, unspecified: O21.9

## 2011-03-02 LAB — LIPASE, BLOOD: Lipase: 28 U/L (ref 11–59)

## 2011-03-02 LAB — URINALYSIS, ROUTINE W REFLEX MICROSCOPIC
Glucose, UA: NEGATIVE mg/dL
Hgb urine dipstick: NEGATIVE
Leukocytes, UA: NEGATIVE
Protein, ur: NEGATIVE mg/dL
Specific Gravity, Urine: 1.025 (ref 1.005–1.030)
pH: 7 (ref 5.0–8.0)

## 2011-03-02 LAB — COMPREHENSIVE METABOLIC PANEL
AST: 21 U/L (ref 0–37)
Albumin: 3.5 g/dL (ref 3.5–5.2)
BUN: 8 mg/dL (ref 6–23)
Calcium: 9.2 mg/dL (ref 8.4–10.5)
Creatinine, Ser: 0.8 mg/dL (ref 0.50–1.10)
Total Protein: 7.3 g/dL (ref 6.0–8.3)

## 2011-03-02 LAB — CBC
MCH: 27.8 pg (ref 26.0–34.0)
MCHC: 32.6 g/dL (ref 30.0–36.0)
MCV: 85.4 fL (ref 78.0–100.0)
Platelets: 317 10*3/uL (ref 150–400)
RBC: 4.03 MIL/uL (ref 3.87–5.11)
RDW: 15.5 % (ref 11.5–15.5)

## 2011-03-02 LAB — HEPATIC FUNCTION PANEL
Albumin: 3.1 g/dL — ABNORMAL LOW (ref 3.5–5.2)
Alkaline Phosphatase: 56 U/L (ref 39–117)
Bilirubin, Direct: <0.1 mg/dL (ref 0.0–0.3)

## 2011-03-02 LAB — PREGNANCY, URINE: Preg Test, Ur: NEGATIVE

## 2011-03-02 LAB — IRON AND TIBC: TIBC: 259 ug/dL (ref 250–470)

## 2011-03-02 LAB — FERRITIN: Ferritin: 16 ng/mL (ref 10–291)

## 2011-03-02 MED ORDER — ONDANSETRON HCL 4 MG PO TABS
4.0000 mg | ORAL_TABLET | ORAL | Status: DC | PRN
  Administered 2011-03-05: 4 mg via ORAL
  Filled 2011-03-02: qty 1

## 2011-03-02 MED ORDER — OXYCODONE HCL 5 MG PO TABS
5.0000 mg | ORAL_TABLET | ORAL | Status: DC | PRN
  Administered 2011-03-03 – 2011-03-05 (×5): 5 mg via ORAL
  Filled 2011-03-02 (×5): qty 1

## 2011-03-02 MED ORDER — ONDANSETRON 8 MG PO TBDP
8.0000 mg | ORAL_TABLET | Freq: Once | ORAL | Status: DC
  Filled 2011-03-02: qty 1

## 2011-03-02 MED ORDER — HYDROCHLOROTHIAZIDE 25 MG PO TABS
25.0000 mg | ORAL_TABLET | Freq: Every day | ORAL | Status: DC
  Administered 2011-03-02 – 2011-03-05 (×4): 25 mg via ORAL
  Filled 2011-03-02 (×4): qty 1

## 2011-03-02 MED ORDER — FAMOTIDINE IN NACL 20-0.9 MG/50ML-% IV SOLN
20.0000 mg | Freq: Once | INTRAVENOUS | Status: AC
  Administered 2011-03-02: 20 mg via INTRAVENOUS
  Filled 2011-03-02: qty 50

## 2011-03-02 MED ORDER — DOCUSATE SODIUM 100 MG PO CAPS
100.0000 mg | ORAL_CAPSULE | Freq: Two times a day (BID) | ORAL | Status: DC
  Administered 2011-03-02 – 2011-03-05 (×6): 100 mg via ORAL
  Filled 2011-03-02 (×7): qty 1

## 2011-03-02 MED ORDER — PANTOPRAZOLE SODIUM 40 MG IV SOLR
40.0000 mg | Freq: Once | INTRAVENOUS | Status: AC
  Administered 2011-03-02: 40 mg via INTRAVENOUS
  Filled 2011-03-02: qty 40

## 2011-03-02 MED ORDER — PANTOPRAZOLE SODIUM 40 MG IV SOLR
40.0000 mg | Freq: Two times a day (BID) | INTRAVENOUS | Status: DC
  Administered 2011-03-02 – 2011-03-04 (×4): 40 mg via INTRAVENOUS
  Filled 2011-03-02 (×5): qty 40

## 2011-03-02 MED ORDER — METOCLOPRAMIDE HCL 5 MG/ML IJ SOLN
10.0000 mg | Freq: Once | INTRAMUSCULAR | Status: AC
  Administered 2011-03-02: 10 mg via INTRAVENOUS
  Filled 2011-03-02: qty 2

## 2011-03-02 MED ORDER — ACETAMINOPHEN 325 MG PO TABS: 650.0000 mg | ORAL_TABLET | Freq: Four times a day (QID) | ORAL | Status: DC | PRN

## 2011-03-02 MED ORDER — ONDANSETRON 8 MG PO TBDP
8.0000 mg | ORAL_TABLET | Freq: Once | ORAL | Status: AC
  Administered 2011-03-02: 8 mg via ORAL

## 2011-03-02 MED ORDER — ACETAMINOPHEN 650 MG RE SUPP: 650.0000 mg | Freq: Four times a day (QID) | RECTAL | Status: DC | PRN

## 2011-03-02 MED ORDER — HYDROMORPHONE HCL PF 2 MG/ML IJ SOLN
INTRAMUSCULAR | Status: AC
  Administered 2011-03-02: 1 mg via INTRAVENOUS
  Filled 2011-03-02: qty 1

## 2011-03-02 MED ORDER — HYDROMORPHONE HCL PF 1 MG/ML IJ SOLN: 1.0000 mg | Freq: Once | INTRAMUSCULAR | Status: DC

## 2011-03-02 MED ORDER — ONDANSETRON HCL 4 MG/2ML IJ SOLN
4.0000 mg | Freq: Once | INTRAMUSCULAR | Status: AC
  Administered 2011-03-02: 4 mg via INTRAVENOUS
  Filled 2011-03-02: qty 2

## 2011-03-02 MED ORDER — METOCLOPRAMIDE HCL 10 MG PO TABS: 10.0000 mg | ORAL_TABLET | Freq: Four times a day (QID) | ORAL | Status: AC

## 2011-03-02 MED ORDER — POTASSIUM CHLORIDE CRYS ER 20 MEQ PO TBCR
40.0000 meq | EXTENDED_RELEASE_TABLET | Freq: Once | ORAL | Status: AC
  Administered 2011-03-02: 40 meq via ORAL
  Filled 2011-03-02 (×2): qty 2

## 2011-03-02 MED ORDER — KETOROLAC TROMETHAMINE 30 MG/ML IJ SOLN
30.0000 mg | Freq: Four times a day (QID) | INTRAMUSCULAR | Status: AC
  Administered 2011-03-02 – 2011-03-04 (×8): 30 mg via INTRAVENOUS
  Filled 2011-03-02 (×9): qty 1

## 2011-03-02 MED ORDER — METOCLOPRAMIDE HCL 5 MG/ML IJ SOLN
10.0000 mg | Freq: Four times a day (QID) | INTRAMUSCULAR | Status: DC
  Administered 2011-03-02: 10 mg via INTRAVENOUS
  Filled 2011-03-02: qty 2

## 2011-03-02 MED ORDER — SODIUM CHLORIDE 0.9 % IV SOLN
INTRAVENOUS | Status: DC
  Administered 2011-03-02 – 2011-03-05 (×8): via INTRAVENOUS

## 2011-03-02 MED ORDER — OXYCODONE-ACETAMINOPHEN 5-325 MG PO TABS
1.0000 | ORAL_TABLET | Freq: Once | ORAL | Status: AC
  Administered 2011-03-02: 1 via ORAL
  Filled 2011-03-02: qty 1

## 2011-03-02 MED ORDER — GI COCKTAIL ~~LOC~~
30.0000 mL | Freq: Once | ORAL | Status: AC
  Administered 2011-03-02: 30 mL via ORAL
  Filled 2011-03-02: qty 30

## 2011-03-02 MED ORDER — BISACODYL 10 MG RE SUPP: 10.0000 mg | Freq: Every day | RECTAL | Status: DC | PRN

## 2011-03-02 MED ORDER — FLEET ENEMA 7-19 GM/118ML RE ENEM: 1.0000 | ENEMA | Freq: Once | RECTAL | Status: AC | PRN

## 2011-03-02 MED ORDER — SODIUM CHLORIDE 0.9 % IV BOLUS (SEPSIS)
1000.0000 mL | Freq: Once | INTRAVENOUS | Status: AC
  Administered 2011-03-02: 1000 mL via INTRAVENOUS

## 2011-03-02 MED ORDER — POLYETHYLENE GLYCOL 3350 17 G PO PACK
17.0000 g | PACK | Freq: Every day | ORAL | Status: DC | PRN
  Administered 2011-03-04: 17 g via ORAL
  Filled 2011-03-02 (×2): qty 1

## 2011-03-02 NOTE — Telephone Encounter (Signed)
Called pt back after speaking with Dr Abbey Chatters and he advised for the pt to go to the ER b/c the vomiting is not related to her surgery. The pt has a GI problem that needs to be seen by GI but for now she needs to go to the ER to get the vomiting under control. The pt does have an appt with GI for 03-23-11. The pt is going to the ER now per Dr Abbey Chatters [facility]

## 2011-03-02 NOTE — ED Notes (Signed)
Pt. Reports she had cholecystectomy 2 weeks ago.  Pt. Returned to surgeon r/t pain and was told she had a hematoma.  Given Zofran for nausea.  No relief.  Began vomiting today, surgeon advised pt. To come to ED.

## 2011-03-02 NOTE — H&P (Signed)
Victoria George MRN: [id number] DOB/AGE: 25-Sep-1975 35 y.o. Primary Care Physician:SMITH,CANDACE Venetia Constable, MD Admit date: 03/02/2011 Chief Complaint:  HPI:  Victoria George is a pleasant 35 year old African American female history of hypertension status post recent lap cholecystectomy 02/01/2011 per Dr. Abbey Chatters, who presents to the ED with worsening epigastric abdominal pain. Patient states that after her cholecystectomy was doing fairly well until approximately one to 2 weeks prior to admission she started to have some right upper quadrant abdominal pain, with nausea and emesis. Patient presented to [facility] where she states that abdominal ultrasound was done which was negative, she was placed on soma Percocet and the discharged home. Patient continued to have pain and a such made an appointment to be seen by her general surgeon Dr. Abbey Chatters. Patient states she was seen by Dr. Abbey Chatters on 02/28/2011, and states that at that time it was felt her pain was musculoskeletal versus hematoma in the nature. Patient was advised to apply heat to the area avoid twisting and bending and placed on NSAIDs twice daily as well as anti-medics. Patient states that ever since then had abdominal pain has become more epigastric in nature more midline which is burning sensation associated with nausea emesis and inability to keep anything down. Patient states abdominal pain is associated with oral intake. Patient denies any hematemesis, no fever, no chills, no chest pain, no shortness of breath, no diarrhea, no dysuria, no weakness. Patient does endorse some mild constipation and states has to use a stool softener in order to have a bowel movement. Patient also endorses some dizziness. Patient was seen in the ED on 02/24/2011 at which point in time a CT scan of the abdomen was done which was unremarkable. Patient at that time was subsequently discharged home. Patient was seen today in the ED on the day of  admission, was given some Dilaudid as well as IV Pepcid and a GI cocktail, however as patient was unable to keep anything down and had persistent nausea and vomiting we will called to admit the patient for further evaluation and management.  Past Medical History  Diagnosis Date  . Hypertension     Past Surgical History  Procedure Date  . Esophagogastroduodenoscopy 01/30/2011    Procedure: ESOPHAGOGASTRODUODENOSCOPY (EGD);  Surgeon: Erick Blinks, MD;  Location: [facility];  Service: Gastroenterology;  Laterality: N/A;  . Diagnostic laparoscopy   . Cholecystectomy 02/01/2011    Procedure: LAPAROSCOPIC CHOLECYSTECTOMY WITH INTRAOPERATIVE CHOLANGIOGRAM;  Surgeon: Adolph Pollack, MD;  Location: [facility];  Service: General;  Laterality: N/A;  Needs IOC and C-arm    Prior to Admission medications   Medication Sig Start Date End Date Taking? Authorizing Provider  docusate sodium (COLACE) 100 MG capsule Take 100 mg by mouth 2 (two) times daily as needed. For constipation    Yes Historical Provider, MD  hydrochlorothiazide (HYDRODIURIL) 25 MG tablet Take 25 mg by mouth daily.     Yes Historical Provider, MD  ondansetron (ZOFRAN) 4 MG tablet Take 1 tablet (4 mg total) by mouth every 8 (eight) hours as needed. For nausea 02/28/11  Yes Adolph Pollack, MD  oxyCODONE-acetaminophen (PERCOCET) 5-325 MG per tablet Take 1 tablet by mouth every 6 (six) hours as needed. For pain    Yes Historical Provider, MD  metoCLOPramide (REGLAN) 10 MG tablet Take 1 tablet (10 mg total) by mouth every 6 (six) hours. 03/02/11 03/12/11  Otilio Miu, PA    Allergies:  Allergies  Allergen Reactions  . Shellfish Allergy Anaphylaxis  .  Hydrocodone Rash  . Phenergan Nausea And Vomiting and Palpitations    Family History  Problem Relation Age of Onset  . Hypertension Mother   . Anesthesia problems Neg Hx   . Malignant hyperthermia Neg Hx   . Pseudochol deficiency Neg Hx     Social History:  reports that  she has never smoked. She has never used smokeless tobacco. She reports that she does not drink alcohol or use illicit drugs.  ROS: All systems reviewed with the patient and was positive as per HPI otherwise all other systems are negative.  PHYSICAL EXAM: Blood pressure 114/70, pulse 77, temperature 98.7 F (37.1 C), temperature source Oral, resp. rate 18, last menstrual period 02/21/2011, SpO2 100.00%. General: Alert, awake, oriented x3, in no acute distress. HEENT: No bruits, no goiter. Heart: Regular rate and rhythm, without murmurs, rubs, gallops. Lungs: Clear to auscultation bilaterally. Abdomen: Soft, Mild TTP in RUQ and rib cage, moderate TTP in epigastrium and mid abdominal region, nondistended, positive bowel sounds. No rebound, no guarding. Extremities: No clubbing cyanosis or edema with positive pedal pulses. Neuro: Alert and oriented x3. Cranial nerves II through XII are grossly intact. No focal deficits. Sensation is intact the visual fields are intact.  nonfocal.   EKG: None  No results found for this or any previous visit (from the past 240 hour(s)).   Lab results:  [facility] 03/02/11 1040  NA 136  K 3.3*  CL 101  CO2 27  GLUCOSE 84  BUN 8  CREATININE 0.80  CALCIUM 9.2  MG --  PHOS --    Basename 03/02/11 1040  AST 21  ALT 18  ALKPHOS 66  BILITOT 0.2*  PROT 7.3  ALBUMIN 3.5    Basename 03/02/11 1040  LIPASE 28  AMYLASE --    Basename 03/02/11 1040  WBC 3.9*  NEUTROABS --  HGB 11.2*  HCT 34.4*  MCV 85.4  PLT 317   No results found for this basename: CKTOTAL:3,CKMB:3,CKMBINDEX:3,TROPONINI:3 in the last 72 hours No results found for this basename: POCBNP:3 in the last 72 hours No results found for this basename: DDIMER in the last 72 hours No results found for this basename: HGBA1C:2 in the last 72 hours No results found for this basename: CHOL:2,HDL:2,LDLCALC:2,TRIG:2,CHOLHDL:2,LDLDIRECT:2 in the last 72 hours No results found for this  basename: TSH,T4TOTAL,FREET3,T3FREE,THYROIDAB in the last 72 hours No results found for this basename: VITAMINB12:2,FOLATE:2,FERRITIN:2,TIBC:2,IRON:2,RETICCTPCT:2 in the last 72 hours Imaging results:  Dg Cholangiogram Operative  02/01/2011  *RADIOLOGY REPORT*  Clinical Data:   Right upper quadrant pain  INTRAOPERATIVE CHOLANGIOGRAM  Comparison: Ultrasound 01/26/2011.  CT 01/27/2011.  Findings: Intraoperative spot images show normal caliber biliary system.  No evidence of retained stone or obstruction.  Free passage of contrast into the small bowel noted.  IMPRESSION: No evidence for retained stone or obstruction.  These images were submitted for radiologic interpretation only. Please see the procedural report for the amount of contrast and the fluoroscopy time utilized.  Original Report Authenticated By: Cyndie Chime, M.D.   Ct Abdomen Pelvis W Contrast  02/24/2011  *RADIOLOGY REPORT*  Clinical Data: Abdominal pain.  Cholecystectomy approximately 3 weeks ago.  Right upper quadrant pain.  CT ABDOMEN AND PELVIS WITH CONTRAST  Technique:  Multidetector CT imaging of the abdomen and pelvis was performed following the standard protocol during bolus administration of intravenous contrast.  Contrast: OMNIPAQUE IOHEXOL 300 MG/ML IV SOLN  Comparison: 01/27/2011  Findings: Lung bases are clear.  No effusions.  Heart is normal size.  Liver, spleen, pancreas, adrenals, kidneys are unremarkable.  Tiny cyst off the lower pole of the right kidney.  No hydronephrosis.  Changes of cholecystectomy.  No fluid collections in the gallbladder fossa or adjacent to the liver.  Aorta is normal caliber.  Large and small bowel are unremarkable.  Appendix is visualized and is normal.  Large hypodense area within the left side of the uterus is stable compatible with fibroid.  This measures 3.7 cm.  Small amount of free fluid in the pelvis.  There is an exophytic fibroid off the posterior aspect of the uterus measuring 2.7 cm.   Adnexa grossly unremarkable.  Urinary bladder is decompressed and unremarkable.  No acute bony abnormality.  IMPRESSION: Prior cholecystectomy.  Uterine fibroids.  No acute findings.  Original Report Authenticated By: Cyndie Chime, M.D.   Impression/Plan:  Active Problems: Abdominal Pain  Nausea & vomiting  HTN (hypertension)  Hypokalemia  Anemia   #1 abdominal pain epigastric greater than right upper quad/nausea and vomiting  Patient's epigastric abdominal pain is described more as a burning sensation, associated with meals with nausea and emesis. Differential includes gastritis versus peptic ulcer disease we'll place on clear liquids. Patient however did have an unremarkable EGD done per  GI on 01/30/2011.  patient's right upper quadrant pain seems more musculoskeletal in nature.  Admit. We'll place on scheduled IV Toradol, IV Protonix,IVF antiemetics, pain management will check LFTs, we'll get an acute abdominal series. Will consult with GI for further evaluation and management. We'll make n.p.o. after midnight. #2 HTN- continue home regimen of HCTZ. #3 hypokalemia-check a magnesium level replete #4 anemia--likely secondary to iron deficiency anemia in a menstruating female. No overt GI bleed. Will check an anemia panel. Follow H&H. #5 prophylaxis-Protonix for GI prophylaxis, SCDs for DVT prophylaxis.   Jaylee Lantry 03/02/2011, 5:45 PM

## 2011-03-02 NOTE — Discharge Planning (Signed)
ED CM noted admission RN referral to CM for medication assistance.  Pt being admitted per Dr Isla Pence orders as observation. ED CM completed handoff to triad CM indicating possible need for medication assistance upon d/c

## 2011-03-02 NOTE — ED Provider Notes (Signed)
History     CSN: [id number] Arrival date & time: 03/02/2011  9:22 AM   First MD Initiated Contact with Patient 03/02/11 1001      Chief Complaint  Patient presents with  . Abdominal Pain  . Nausea  . Emesis  . Post-op Problem    (Consider location/radiation/quality/duration/timing/severity/associated sxs/prior treatment) HPI History provided by pt and prior chart.  Per prior chart, pt had a laparoscopic cholecystectomy on 02/01/11.  No complications for the first 3-4wks and then developed RUQ pain and post-prandial N/V.  Had an [place] at [facility] that showed sm amt fluid in gallbladder fossa.  At [facility] on 11/30, she had a neg CT.  Followed up with Dr. Abbey Chatters on 12/4 and he attributes sx to musculoskeletal pain/hematoma.  Recommended moist heat, aleve and zofran.  Pt reports compliance but sx have not improved.  Has also been taking pepcid.  Now experiencing burning pain down center of abd, constant nausea and PP vomiting.  Called Dr. Abbey Chatters this am and he referred her to ED.  She denies fever, hematemesis, diarrhea, urinary sx.  Past Medical History  Diagnosis Date  . Hypertension     Past Surgical History  Procedure Date  . Diagnostic laparoscopy   . Cholecystectomy 02/01/2011    Procedure: LAPAROSCOPIC CHOLECYSTECTOMY WITH INTRAOPERATIVE CHOLANGIOGRAM;  Surgeon: Adolph Pollack, MD;  Location: [facility];  Service: General;  Laterality: N/A;  Needs IOC and C-arm  . Esophagogastroduodenoscopy 01/30/2011    Procedure: ESOPHAGOGASTRODUODENOSCOPY (EGD);  Surgeon: Erick Blinks, MD;  Location: [facility];  Service: Gastroenterology;  Laterality: N/A;    Family History  Problem Relation Age of Onset  . Hypotension Mother   . Anesthesia problems Neg Hx   . Malignant hyperthermia Neg Hx   . Pseudochol deficiency Neg Hx     History  Substance Use Topics  . Smoking status: Never Smoker   . Smokeless tobacco: Not on file  . Alcohol Use: No     occational    OB History    Grav Para Term Preterm Abortions TAB SAB Ect Mult Living   0               Review of Systems  All other systems reviewed and are negative.    Allergies  Shellfish allergy; Hydrocodone; and Phenergan  Home Medications   Current Outpatient Rx  Name Route Sig Dispense Refill  . DOCUSATE SODIUM 100 MG PO CAPS Oral Take 100 mg by mouth 2 (two) times daily as needed. For constipation     . HYDROCHLOROTHIAZIDE 25 MG PO TABS Oral Take 25 mg by mouth daily.      Marland Kitchen ONDANSETRON HCL 4 MG PO TABS Oral Take 1 tablet (4 mg total) by mouth every 8 (eight) hours as needed. For nausea 30 tablet 1  . OXYCODONE-ACETAMINOPHEN 5-325 MG PO TABS Oral Take 1 tablet by mouth every 6 (six) hours as needed. For pain       BP 117/93  Pulse 90  Temp(Src) 98.3 F (36.8 C) (Oral)  Resp 18  SpO2 100%  LMP 02/21/2011  Physical Exam  Nursing note and vitals reviewed. Constitutional: She is oriented to person, place, and time. She appears well-developed and well-nourished. No distress.       Uncomfortable appearing  HENT:  Head: Normocephalic and atraumatic.  Eyes:       Normal appearance  Neck: Normal range of motion.  Cardiovascular: Normal rate and regular rhythm.   Pulmonary/Chest: Effort normal and breath sounds  normal.  Abdominal: Soft. Bowel sounds are normal. She exhibits no distension and no mass. There is no rebound and no guarding.       Well-healed incisions w/out signs of infection.  Mild RUQ ttp, mod epigastric and periumbilical ttp.   Neurological: She is alert and oriented to person, place, and time.  Skin: Skin is warm and dry. No rash noted.  Psychiatric: She has a normal mood and affect. Her behavior is normal.    ED Course  Procedures (including critical care time)  Labs Reviewed  CBC - Abnormal; Notable for the following:    WBC 3.9 (*)    Hemoglobin 11.2 (*)    HCT 34.4 (*)    All other components within normal limits  COMPREHENSIVE METABOLIC PANEL - Abnormal; Notable for  the following:    Potassium 3.3 (*)    Total Bilirubin 0.2 (*)    All other components within normal limits  URINALYSIS, ROUTINE W REFLEX MICROSCOPIC - Abnormal; Notable for the following:    APPearance CLOUDY (*)    All other components within normal limits  PREGNANCY, URINE  LIPASE, BLOOD   No results found.   1. Abdominal pain   2. Vomiting       MDM  Pt had lap chole on 02/01/11.  Developed pain and N/V weeks later.  Nml CT in ED on 11/30.  F/u with Dr. Abbey Chatters 2 days ago and pain thought to be musculoskeletal/hematoma.  Pt returns to ED today b/c pain continues to worsen and not tolerating pos.  Afebrile, uncomfortable appearing, abd soft but RUQ, epigastric and periumbilical ttp on exam. Will recheck labs, treat sx w/ IV reglan, dilaudid and fluids and consult GS.  10:30 AM   Pt did not tolerate GI cocktail.  It numbed her throat and she felt like she couldn't breath.  Labs w/in nml limits.  No leukocytosis and hgb stable.  Pt has received 2 rounds of nausea and pain medication.  Failed po challenge. Vomiting and pain has worsened.  IV protonix and pepcid ordered.  [facility] consulted for admission.  They requested GI consultation.  3:53 PM   Dr. Leone Payor aware of patient.  5:32 PM        Otilio Miu, PA 03/02/11 1733

## 2011-03-02 NOTE — Telephone Encounter (Signed)
Pt called c/o vomiting since Tuesday after seeing Dr Abbey Chatters in the office. The pt is on Zofran and it's not working. The pt is allergic to Phenergan and Hydrocodone. I advised the pt that I would page Dr Abbey Chatters to see what he advises and call her back.Victoria KitchenHulda Humphrey

## 2011-03-02 NOTE — ED Provider Notes (Signed)
See prior note   Ward Givens, MD 03/02/11 2022

## 2011-03-02 NOTE — ED Notes (Signed)
B/P 102/66, HR  75  R 20  Pulse ox 100%  Waiting for admission to floor.  Stable and reports decreased abdominal pain--now rates her abd. Pain a 6 ono 1-10 scale.

## 2011-03-02 NOTE — ED Provider Notes (Signed)
Patient had laparoscopic cholecystectomy in early November. She states she was doing well however she started having more right upper quadrant pain and now also has epigastric pain. She's been taking Aleve over-the-counter for her pain. She's also complaining of a lot of constipation. Patient states her epigastric pain is burning and when she eats she immediately gets burning and then has vomiting. She is getting a GI referral however that isn't until couple weeks. Patient was treated in the ER however she is able to tolerate eating or drinking without vomiting.  Recent symptoms sound like gastritis and/or peptic ulcer disease of the stomach. She is unable to tolerate oral hydration.  Medical screening examination/treatment/procedure(s) were conducted as a shared visit with non-physician practitioner(s) and myself.  I personally evaluated the patient during the encounter Devoria Albe, MD, Franz Dell, MD 03/02/11 [phone]

## 2011-03-03 ENCOUNTER — Encounter (HOSPITAL_COMMUNITY)
Admission: EM | Disposition: A | Discharge status: Home / Self Care | Payer: Self-pay | Source: Home / Self Care | Attending: Emergency Medicine

## 2011-03-03 ENCOUNTER — Encounter (HOSPITAL_COMMUNITY): Payer: Self-pay | Admitting: *Deleted

## 2011-03-03 ENCOUNTER — Other Ambulatory Visit: Payer: Self-pay | Admitting: Internal Medicine

## 2011-03-03 DIAGNOSIS — R1013 Epigastric pain: Secondary | ICD-10-CM

## 2011-03-03 DIAGNOSIS — R112 Nausea with vomiting, unspecified: Secondary | ICD-10-CM

## 2011-03-03 HISTORY — PX: ESOPHAGOGASTRODUODENOSCOPY: SHX5428

## 2011-03-03 LAB — COMPREHENSIVE METABOLIC PANEL
Alkaline Phosphatase: 54 U/L (ref 39–117)
BUN: 6 mg/dL (ref 6–23)
Chloride: 106 mEq/L (ref 96–112)
Creatinine, Ser: 0.85 mg/dL (ref 0.50–1.10)
GFR calc Af Amer: >90 mL/min (ref >90)
Glucose, Bld: 79 mg/dL (ref 70–99)
Potassium: 3.2 mEq/L — ABNORMAL LOW (ref 3.5–5.1)
Total Bilirubin: 0.1 mg/dL — ABNORMAL LOW (ref 0.3–1.2)

## 2011-03-03 LAB — PROTIME-INR
INR: 0.98 (ref 0.00–1.49)
Prothrombin Time: 13.2 seconds (ref 11.6–15.2)

## 2011-03-03 LAB — CBC
Platelets: 292 10*3/uL (ref 150–400)
RDW: 15.3 % (ref 11.5–15.5)
WBC: 3.5 10*3/uL — ABNORMAL LOW (ref 4.0–10.5)

## 2011-03-03 LAB — APTT: aPTT: 31 seconds (ref 24–37)

## 2011-03-03 LAB — MAGNESIUM: Magnesium: 1.8 mg/dL (ref 1.5–2.5)

## 2011-03-03 SURGERY — EGD (ESOPHAGOGASTRODUODENOSCOPY)
Anesthesia: Moderate Sedation

## 2011-03-03 MED ORDER — MIDAZOLAM HCL 10 MG/2ML IJ SOLN
INTRAMUSCULAR | Status: DC | PRN
  Administered 2011-03-03 (×2): 2.5 mg via INTRAVENOUS
  Administered 2011-03-03: 1 mg via INTRAVENOUS
  Administered 2011-03-03: 2 mg via INTRAVENOUS

## 2011-03-03 MED ORDER — POTASSIUM CHLORIDE CRYS ER 20 MEQ PO TBCR
40.0000 meq | EXTENDED_RELEASE_TABLET | ORAL | Status: AC
  Administered 2011-03-03: 40 meq via ORAL
  Filled 2011-03-03 (×2): qty 2

## 2011-03-03 MED ORDER — POTASSIUM CHLORIDE CRYS ER 20 MEQ PO TBCR
40.0000 meq | EXTENDED_RELEASE_TABLET | Freq: Every day | ORAL | Status: DC
  Administered 2011-03-04 – 2011-03-05 (×2): 40 meq via ORAL
  Filled 2011-03-03 (×2): qty 2

## 2011-03-03 MED ORDER — POTASSIUM CHLORIDE CRYS ER 20 MEQ PO TBCR
40.0000 meq | EXTENDED_RELEASE_TABLET | ORAL | Status: DC
  Filled 2011-03-03 (×2): qty 2

## 2011-03-03 MED ORDER — FENTANYL NICU IV SYRINGE 50 MCG/ML
INJECTION | INTRAMUSCULAR | Status: DC | PRN
  Administered 2011-03-03 (×3): 25 ug via INTRAVENOUS

## 2011-03-03 MED ORDER — BUTAMBEN-TETRACAINE-BENZOCAINE 2-2-14 % EX AERO
INHALATION_SPRAY | CUTANEOUS | Status: DC | PRN
  Administered 2011-03-03: 2 via TOPICAL

## 2011-03-03 MED ORDER — ONDANSETRON HCL 4 MG/2ML IJ SOLN
4.0000 mg | Freq: Four times a day (QID) | INTRAMUSCULAR | Status: DC | PRN
  Administered 2011-03-04 (×3): 4 mg via INTRAVENOUS
  Filled 2011-03-03 (×4): qty 2

## 2011-03-03 NOTE — Consult Note (Signed)
[facility] Consultation  Referring Provider:  [facility] Primary Care Physician:  Allean Found, MD Primary Gastroenterologist:   Maximino Sarin, MD in November 2012 as an unassigned patient Reason for Consultation:  Abdominal pain  HPI: Victoria George is a 35 y.o. female who was seen by [place] in November while hospitalized for epigastric / RUQ pain. Ultrasound showed gallstone sludge / ? Small stones. She had a normal EGD followed by a normal HIDA scan. Patient ultimately had a cholecystectomy with LOA on 02/01/11.  Pathology c/w mild chronic cholecystitis, no cholelithiasis. Patient felt better post-operatively until several days ago when she had recurrent RUQ pain and nausea.  Patient was transported by EMS from work to [facility] where and ultrasound was reportedly normal. Patient was released home with Zofran, Pepcid and Soma. Pain escalated and on 11/30 she went to [facility] ER where CTscan and labs were unremarkable. Patient was told to follow up with her surgeon. Patient saw Dr. Abbey Chatters on 02/28/11. He felt right sided pain was musculoskeletal and recommended Zofran, twice daily Aleve, and local heat. Her RUQ pain is still present but over last few days she has also developed epigastric pain with nausea and actual vomiting. The epigastric pain is intermittent, described as "burning". After meals it becomes stabbing in nature. Emesis consists whatever she has eaten, no coffee ground emesis. No black stools. Patient takes daily stool softeners for constipation. Reglan on home med list but she hasn't been taking it. She has taken Oxycodone as needed since cholecystectomy but hasn't noticed any correlation with pain meds and nausea.   Past Medical History  Diagnosis Date  . Hypertension     Past Surgical History  Procedure Date  . Esophagogastroduodenoscopy 01/30/2011    Procedure: ESOPHAGOGASTRODUODENOSCOPY (EGD);  Surgeon: Erick Blinks, MD;  Location: [facility];   Service: Gastroenterology;  Laterality: N/A;  . Diagnostic laparoscopy   . Cholecystectomy 02/01/2011    Procedure: LAPAROSCOPIC CHOLECYSTECTOMY WITH INTRAOPERATIVE CHOLANGIOGRAM;  Surgeon: Adolph Pollack, MD;  Location: [facility];  Service: General;  Laterality: N/A;  Needs IOC and C-arm    Prior to Admission medications   Medication Sig Start Date End Date Taking? Authorizing Provider  docusate sodium (COLACE) 100 MG capsule Take 100 mg by mouth 2 (two) times daily as needed. For constipation    Yes Historical Provider, MD  hydrochlorothiazide (HYDRODIURIL) 25 MG tablet Take 25 mg by mouth daily.     Yes Historical Provider, MD  ondansetron (ZOFRAN) 4 MG tablet Take 1 tablet (4 mg total) by mouth every 8 (eight) hours as needed. For nausea 02/28/11  Yes Adolph Pollack, MD  oxyCODONE-acetaminophen (PERCOCET) 5-325 MG per tablet Take 1 tablet by mouth every 6 (six) hours as needed. For pain    Yes Historical Provider, MD  metoCLOPramide (REGLAN) 10 MG tablet Take 1 tablet (10 mg total) by mouth every 6 (six) hours. 03/02/11 03/12/11  Arie Sabina Schinlever, PA    Current Facility-Administered Medications  Medication Dose Route Frequency Provider Last Rate Last Dose  . 0.9 %  sodium chloride infusion   Intravenous Continuous Ramiro Harvest 125 mL/hr at 03/03/11 0405    . acetaminophen (TYLENOL) tablet 650 mg  650 mg Oral Q6H PRN Ramiro Harvest       Or  . acetaminophen (TYLENOL) suppository 650 mg  650 mg Rectal Q6H PRN Ramiro Harvest      . bisacodyl (DULCOLAX) suppository 10 mg  10 mg Rectal Daily PRN Ramiro Harvest      .  docusate sodium (COLACE) capsule 100 mg  100 mg Oral BID Ramiro Harvest   100 mg at 03/03/11 1610  . famotidine (PEPCID) IVPB 20 mg  20 mg Intravenous Once Otilio Miu, PA   20 mg at 03/02/11 1648  . gi cocktail  30 mL Oral Once Otilio Miu, PA   30 mL at 03/02/11 1139  . hydrochlorothiazide (HYDRODIURIL) tablet 25 mg  25 mg Oral Daily Ramiro Harvest   25 mg at 03/03/11 9604  . HYDROmorphone (DILAUDID) 2 MG/ML injection        1 mg at 03/02/11 1147  . ketorolac (TORADOL) 30 MG/ML injection 30 mg  30 mg Intravenous Q6H Daniel Thompson   30 mg at 03/03/11 0615  . metoCLOPramide (REGLAN) injection 10 mg  10 mg Intravenous Once Otilio Miu, PA   10 mg at 03/02/11 1141  . ondansetron (ZOFRAN) injection 4 mg  4 mg Intravenous Once Otilio Miu, PA   4 mg at 03/02/11 1442  . ondansetron (ZOFRAN) injection 4 mg  4 mg Intravenous Q6H PRN Ramiro Harvest      . ondansetron (ZOFRAN) tablet 4 mg  4 mg Oral Q4H PRN Ramiro Harvest      . ondansetron (ZOFRAN-ODT) disintegrating tablet 8 mg  8 mg Oral Once Otilio Miu, PA   8 mg at 03/02/11 1504  . oxyCODONE (Oxy IR/ROXICODONE) immediate release tablet 5 mg  5 mg Oral Q4H PRN Ramiro Harvest      . oxyCODONE-acetaminophen (PERCOCET) 5-325 MG per tablet 1 tablet  1 tablet Oral Once Otilio Miu, PA   1 tablet at 03/02/11 1442  . pantoprazole (PROTONIX) injection 40 mg  40 mg Intravenous Once Otilio Miu, PA   40 mg at 03/02/11 1646  . pantoprazole (PROTONIX) injection 40 mg  40 mg Intravenous Q12H Daniel Thompson   40 mg at 03/03/11 5409  . polyethylene glycol (MIRALAX / GLYCOLAX) packet 17 g  17 g Oral Daily PRN Ramiro Harvest      . potassium chloride SA (K-DUR,KLOR-CON) CR tablet 40 mEq  40 mEq Oral Once Ramiro Harvest   40 mEq at 03/02/11 2131  . potassium chloride SA (K-DUR,KLOR-CON) CR tablet 40 mEq  40 mEq Oral Daily Ramiro Harvest      . potassium chloride SA (K-DUR,KLOR-CON) CR tablet 40 mEq  40 mEq Oral Q4H Otho Bellows, PHARMD   40 mEq at 03/03/11 0952  . sodium chloride 0.9 % bolus 1,000 mL  1,000 mL Intravenous Once Otilio Miu, PA   1,000 mL at 03/02/11 1140  . sodium phosphate (FLEET) 7-19 GM/118ML enema 1 enema  1 enema Rectal Once PRN Ramiro Harvest      . DISCONTD: HYDROmorphone (DILAUDID) injection 1 mg  1 mg  Intravenous Once Otilio Miu, PA      . DISCONTD: metoCLOPramide (REGLAN) injection 10 mg  10 mg Intravenous Q6H Arie Sabina Schinlever, PA   10 mg at 03/02/11 1642  . DISCONTD: ondansetron (ZOFRAN-ODT) disintegrating tablet 8 mg  8 mg Oral Once Otilio Miu, PA      . DISCONTD: potassium chloride SA (K-DUR,KLOR-CON) CR tablet 40 mEq  40 mEq Oral Q4H Ramiro Harvest        Allergies as of 03/02/2011 - Review Complete 03/02/2011  Allergen Reaction Noted  . Shellfish allergy Anaphylaxis 12/16/2010  . Hydrocodone Rash 12/16/2010  . Phenergan Nausea And Vomiting and Palpitations 02/24/2011    Family History  Problem Relation  Age of Onset  . Hypertension Mother   . Anesthesia problems Neg Hx   . Malignant hyperthermia Neg Hx   . Pseudochol deficiency Neg Hx     History   Social History  . Marital Status: Single    Spouse Name: N/A    Number of Children: N/A  . Years of Education: N/A   Occupational History  . Not on file.   Social History Main Topics  . Smoking status: Never Smoker   . Smokeless tobacco: Never Used  . Alcohol Use: No     occational  . Drug Use: No  . Sexually Active: Yes    Birth Control/ Protection: None   Other Topics Concern  . Not on file   Social History Narrative  . No narrative on file    Review of Systems: All systems reviewed and negative except where noted in HPI  Physical Exam: Vital signs in last 24 hours: Temp:  [97.5 F (36.4 C)-98.7 F (37.1 C)] 97.9 F (36.6 C) (12/07 0634) Pulse Rate:  [63-80] 63  (12/07 0634) Resp:  [18-20] 18  (12/07 0634) BP: (102-118)/(66-90) 110/75 mmHg (12/07 0634) SpO2:  [98 %-100 %] 98 % (12/07 0634) Weight:  [79.379 kg (175 lb)] 175 lb (79.379 kg) (12/07 0634) Last BM Date: 03/02/11 General:  Pleasant black female in NAD Head:  Normocephalic and atraumatic. Eyes:   No icterus.   Conjunctiva pink. Ears:  Normal auditory acuity. Mouth:  Tongue moist. Neck:  Supple; no masses  felt Lungs:  Respirations even and unlabored. Lungs clear to auscultation bilaterlly.   No wheezes, crackles, or rhonchi.  Heart:  Regular rate and rhythm; no murmurs heard. Abdomen:  Soft, nondistended, moderate RUQ and epigastric tenderness. Normal bowel sounds. No abdominal guarding.No masses, hepatomegaly or obvious hernias noted.  Rectal:  Not performed.  Msk:  Symmetrical without gross deformities.  Extremities:  Without edema. Neurologic:  Alert and  oriented x4;  grossly normal neurologically. Skin:  Intact without significant lesions or rashes. Cervical Nodes:  No significant cervical adenopathy. Psych:  Alert and cooperative. Normal mood and affect.   Lab Results:  [facility] 03/03/11 0333 03/02/11 1040  WBC 3.5* 3.9*  HGB 10.0* 11.2*  HCT 30.4* 34.4*  PLT 292 317   BMET  Basename 03/03/11 0333 03/02/11 1040  NA 136 136  K 3.2* 3.3*  CL 106 101  CO2 25 27  GLUCOSE 79 84  BUN 6 8  CREATININE 0.85 0.80  CALCIUM 8.3* 9.2   LFT  Basename 03/03/11 0333 03/02/11 1851  PROT 6.1 --  ALBUMIN 2.9* --  AST 17 --  ALT 14 --  ALKPHOS 54 --  BILITOT 0.1* --  BILIDIR -- <0.1  IBILI -- NOT CALCULATED   PT/INR  Basename 03/03/11 0333  LABPROT 13.2  INR 0.98    Previous Endoscopies: EGD November 2012 ([facility]) for epigastric pain - normal  Impression / Plan: 65. 35 year old black female with new onset epigastric pain exacerbated by meals and associated with nausea and vomiting. Lipase, LFTs, CTscan all unremarkable. Normal EGD early November but she could have developed an ulcer in the interim. Recommend PPI over the H2 blocker she has been taking. For further evaluation will proceed with upper endoscopy this afternoon.   2. RUQ pain, she is s/p cholecystectomy 02/01/11. Ultrasound and CTscan negative. Labs unremarkable. Cause of pain not clear but possibly musculoskeletal.  Thanks    LOS: 1 day   Willette Cluster  03/03/2011, 10:48 AM

## 2011-03-03 NOTE — Op Note (Signed)
[facility] [address] [place], [place]  [address]  ENDOSCOPY PROCEDURE REPORT  PATIENT:  Victoria George, Victoria George  MR#:  [id number] BIRTHDATE:  02/05/1976, 35 yrs. old  GENDER:  female ENDOSCOPIST:  Carie Caddy. Delaine Hernandez, MD Referred by:  [facility] PROCEDURE DATE:  03/03/2011 PROCEDURE:  EGD with biopsy, 43239 ASA CLASS:  Class II INDICATIONS:  epigastric pain, nausea and vomiting MEDICATIONS:   Fentanyl 75 mcg IV, Versed 8 mg IV TOPICAL ANESTHETIC:  Cetacaine Spray  DESCRIPTION OF PROCEDURE:   After the risks benefits and alternatives of the procedure were thoroughly explained, informed consent was obtained.  The Pentax Gastroscope [id number] endoscope was introduced through the mouth and advanced to the second portion of the duodenum, without limitations.  The instrument was slowly withdrawn as the mucosa was fully examined. <<PROCEDUREIMAGES>>  The esophagus and gastroesophageal junction were completely normal in appearance.  Mild gastritis, characterized by erythema and very superficial erosions was found in the antrum. Multiple biopsies were obtained and sent to pathology.  Otherwise normal stomach. The duodenal bulb was normal in appearance, as was the postbulbar duodenum.    Retroflexed views revealed no abnormalities.    The scope was then withdrawn from the patient and the procedure completed.  COMPLICATIONS:  None  ENDOSCOPIC IMPRESSION: 1) Normal esophagus 2) Mild gastritis in the antrum.  Biopsies taken to exclude H. Pylori. 3) Otherwise normal stomach 4) Normal duodenum  RECOMMENDATIONS: 1) Avoid NSAIDS 2) Await pathology results 3) Continue taking your PPI (antiacid medicine) once daily. It is best to be taken 20-30 minutes prior to breakfast meal.  Carie Caddy. Rhea Belton, MD  n. Rosalie DoctorCarie Caddy. Kenadi Miltner at 03/03/2011 04:09 PM  Haynes Kerns, [phone]

## 2011-03-03 NOTE — Progress Notes (Signed)
INITIAL ADULT NUTRITION ASSESSMENT Date: 03/03/2011   Time: 10:56 AM Reason for Assessment: Nutrition risk   ASSESSMENT: Female 35 y.o.  Dx: Abdominal pain, epigastric  Hx:  Past Medical History  Diagnosis Date  . Hypertension    Related Meds:  Scheduled Meds:   . docusate sodium  100 mg Oral BID  . famotidine  20 mg Intravenous Once  . gi cocktail  30 mL Oral Once  . hydrochlorothiazide  25 mg Oral Daily  . HYDROmorphone      . ketorolac  30 mg Intravenous Q6H  . metoCLOPramide (REGLAN) injection  10 mg Intravenous Once  . ondansetron (ZOFRAN) IV  4 mg Intravenous Once  . ondansetron  8 mg Oral Once  . oxyCODONE-acetaminophen  1 tablet Oral Once  . pantoprazole (PROTONIX) IV  40 mg Intravenous Once  . pantoprazole (PROTONIX) IV  40 mg Intravenous Q12H  . potassium chloride  40 mEq Oral Once  . potassium chloride  40 mEq Oral Daily  . potassium chloride  40 mEq Oral Q4H  . sodium chloride  1,000 mL Intravenous Once  . DISCONTD: HYDROmorphone  1 mg Intravenous Once  . DISCONTD: metoCLOPramide (REGLAN) injection  10 mg Intravenous Q6H  . DISCONTD: ondansetron  8 mg Oral Once  . DISCONTD: potassium chloride  40 mEq Oral Q4H   Continuous Infusions:   . sodium chloride 125 mL/hr at 03/03/11 0405   PRN Meds:.acetaminophen, acetaminophen, bisacodyl, ondansetron (ZOFRAN) IV, ondansetron, oxyCODONE, polyethylene glycol, sodium phosphate  Ht: 5\' 1"  (154.9 cm)  Wt: 175 lb (79.379 kg)  Ideal Wt: 47.8 kg  % Ideal Wt: 165  Usual Wt: 84kg % Usual Wt: 94  Body mass index is 33.07 kg/(m^2).  Food/Nutrition Related Hx: Pt reports poor intake since this past Tuesday r/t abdominal pain, nausea and vomiting after eating. Pt reports unintentional weight loss of 10 pounds since cholecystectomy on 02/01/11. Pt reports pain improved, and no nausea today, however reports nausea and 1 episode of vomiting yesterday.   Acute abdomen series yesterday showed no acute findings.   Labs:    CMP     Component Value Date/Time   NA 136 03/03/2011 0333   K 3.2* 03/03/2011 0333   CL 106 03/03/2011 0333   CO2 25 03/03/2011 0333   GLUCOSE 79 03/03/2011 0333   BUN 6 03/03/2011 0333   CREATININE 0.85 03/03/2011 0333   CALCIUM 8.3* 03/03/2011 0333   PROT 6.1 03/03/2011 0333   ALBUMIN 2.9* 03/03/2011 0333   AST 17 03/03/2011 0333   ALT 14 03/03/2011 0333   ALKPHOS 54 03/03/2011 0333   BILITOT 0.1* 03/03/2011 0333   GFRNONAA 86* 03/03/2011 0333   GFRAA >90 03/03/2011 0333    Intake/Output Summary (Last 24 hours) at 03/03/11 1100 Last data filed at 03/03/11 1610  Gross per 24 hour  Intake 880.42 ml  Output      0 ml  Net 880.42 ml    Diet Order: NPO  IVF:    sodium chloride Last Rate: 125 mL/hr at 03/03/11 0405    Estimated Nutritional Needs:   Kcal:1550-1800 Protein:80-95g Fluid:1.5-1.8L  NUTRITION DIAGNOSIS: -Inadequate oral intake (NI-2.1).  Status: Ongoing -Pt meets criteria for severe PCM of acute illness AEB 5.4% weight loss in the past month and <50% intake for the past week   RELATED TO: abdominal pain, N/V  AS EVIDENCE BY: pt statement, NPO status  MONITORING/EVALUATION(Goals): Advance diet as tolerated to bland diet.   EDUCATION NEEDS: -Education needs addressed - discussed  nutrition therapy for nausea.   INTERVENTION: Diet advancement per MD. Will monitor.   Dietitian # [phone]  DOCUMENTATION CODES Per approved criteria  -Severe malnutrition in the context of acute illness or injury -Obesity unspecified     Marshall Cork 03/03/2011, 10:56 AM

## 2011-03-03 NOTE — Progress Notes (Signed)
Subjective: No nausea, no emesis. Still with burning epigastric and mid abdominal pain > RUQ pain. Tolerated clears last night.  Objective: Vital signs in last 24 hours: Filed Vitals:   03/02/11 1756 03/02/11 1815 03/02/11 2244 03/03/11 0634  BP: 102/66 107/74 111/75 110/75  Pulse: 75 65 64 63  Temp: 97.9 F (36.6 C) 97.6 F (36.4 C) 98.2 F (36.8 C) 97.9 F (36.6 C)  TempSrc: Oral Oral Oral Oral  Resp: 20 20 18 18   Height:  5\' 1"  (1.549 m)    Weight:  79.379 kg (175 lb)  79.379 kg (175 lb)  SpO2: 100% 100% 100% 98%    Intake/Output Summary (Last 24 hours) at 03/03/11 0748 Last data filed at 03/03/11 0405  Gross per 24 hour  Intake 880.42 ml  Output      0 ml  Net 880.42 ml    Weight change:   General: Alert, awake, oriented x3, in no acute distress. Heart: Regular rate and rhythm, without murmurs, rubs, gallops. Lungs: Clear to auscultation bilaterally. Abdomen: Soft, R lower ribs TTP, epigastric region very TTP, nondistended, positive bowel sounds. Extremities: No clubbing cyanosis or edema with positive pedal pulses. Neuro: Grossly intact, nonfocal.   Lab Results:  Basename 03/03/11 0333 03/02/11 1851 03/02/11 1040  NA 136 -- 136  K 3.2* -- 3.3*  CL 106 -- 101  CO2 25 -- 27  GLUCOSE 79 -- 84  BUN 6 -- 8  CREATININE 0.85 -- 0.80  CALCIUM 8.3* -- 9.2  MG 1.8 1.9 --  PHOS -- -- --    Basename 03/03/11 0333 03/02/11 1851  AST 17 18  ALT 14 16  ALKPHOS 54 56  BILITOT 0.1* 0.2*  PROT 6.1 6.4  ALBUMIN 2.9* 3.1*    Basename 03/02/11 1040  LIPASE 28  AMYLASE --    Basename 03/03/11 0333 03/02/11 1040  WBC 3.5* 3.9*  NEUTROABS -- --  HGB 10.0* 11.2*  HCT 30.4* 34.4*  MCV 84.9 85.4  PLT 292 317   No results found for this basename: CKTOTAL:3,CKMB:3,CKMBINDEX:3,TROPONINI:3 in the last 72 hours No results found for this basename: POCBNP:3 in the last 72 hours No results found for this basename: DDIMER:2 in the last 72 hours No results found for  this basename: HGBA1C:2 in the last 72 hours No results found for this basename: CHOL:2,HDL:2,LDLCALC:2,TRIG:2,CHOLHDL:2,LDLDIRECT:2 in the last 72 hours No results found for this basename: TSH,T4TOTAL,FREET3,T3FREE,THYROIDAB in the last 72 hours  Basename 03/02/11 1851  VITAMINB12 735  FOLATE 10.7  FERRITIN 16  TIBC 259  IRON 54  RETICCTPCT --    Micro Results: No results found for this or any previous visit (from the past 240 hour(s)).  Studies/Results: Acute Abdominal Series  03/02/2011  *RADIOLOGY REPORT*  Clinical Data: Nausea, vomiting.  ACUTE ABDOMEN SERIES (ABDOMEN 2 VIEW & CHEST 1 VIEW)  Comparison: 03/02/2011 CT.  Chest x-ray 01/26/2011.  Findings: Prior cholecystectomy. The bowel gas pattern is normal. There is no evidence of free intraperitoneal air.  No suspicious radio-opaque calculi or other significant radiographic abnormality is seen. Heart size and mediastinal contours are within normal limits.  Both lungs are clear.  IMPRESSION: No acute findings or change since CT earlier today.  Original Report Authenticated By: Cyndie Chime, M.D.    Medications:     . docusate sodium  100 mg Oral BID  . famotidine  20 mg Intravenous Once  . gi cocktail  30 mL Oral Once  . hydrochlorothiazide  25 mg Oral Daily  .  HYDROmorphone      . ketorolac  30 mg Intravenous Q6H  . metoCLOPramide (REGLAN) injection  10 mg Intravenous Once  . ondansetron (ZOFRAN) IV  4 mg Intravenous Once  . ondansetron  8 mg Oral Once  . oxyCODONE-acetaminophen  1 tablet Oral Once  . pantoprazole (PROTONIX) IV  40 mg Intravenous Once  . pantoprazole (PROTONIX) IV  40 mg Intravenous Q12H  . potassium chloride  40 mEq Oral Once  . potassium chloride  40 mEq Oral Q4H  . sodium chloride  1,000 mL Intravenous Once  . DISCONTD: HYDROmorphone  1 mg Intravenous Once  . DISCONTD: metoCLOPramide (REGLAN) injection  10 mg Intravenous Q6H  . DISCONTD: ondansetron  8 mg Oral Once    Assessment/Plan Abdominal  pain, epigastric Active Problems:  Nausea & vomiting  HTN (hypertension)  Hypokalemia  Anemia  #1. Abdominal Pain, epigastric- unknown etiology. Likely GI, as pain associated with meals and burning in nature. Patient still with abdominal pain. Lipase levels WNL. Abdominal xray unremarkable. Continue scheduled toradol, PPI, IVF, supportive care, antiemetics. Patient currently NPO. GI consult pending. Follow. #2. HTN - Continue HCTZ #3. Hypokalemia - secondary to diuretic. Replete. #4. Anemia- No overt GI bleed. Follow H/H. #5. Prophylaxis- PPI for GI, SCD for DVT.     LOS: 1 day   [facility] 03/03/2011, 7:48 AM

## 2011-03-03 NOTE — Consult Note (Signed)
Patient seen, examined and I agree with the above documentation, including the assessment and plan.  

## 2011-03-04 DIAGNOSIS — M7918 Myalgia, other site: Secondary | ICD-10-CM | POA: Diagnosis present

## 2011-03-04 DIAGNOSIS — R1013 Epigastric pain: Secondary | ICD-10-CM

## 2011-03-04 DIAGNOSIS — R112 Nausea with vomiting, unspecified: Secondary | ICD-10-CM

## 2011-03-04 DIAGNOSIS — K2970 Gastritis, unspecified, without bleeding: Secondary | ICD-10-CM | POA: Diagnosis present

## 2011-03-04 LAB — DIFFERENTIAL
Basophils Absolute: 0 10*3/uL (ref 0.0–0.1)
Eosinophils Relative: 2 % (ref 0–5)
Lymphocytes Relative: 51 % — ABNORMAL HIGH (ref 12–46)
Monocytes Absolute: 0.4 10*3/uL (ref 0.1–1.0)
Monocytes Relative: 12 % (ref 3–12)
Neutro Abs: 1.3 10*3/uL — ABNORMAL LOW (ref 1.7–7.7)

## 2011-03-04 LAB — BASIC METABOLIC PANEL
BUN: 6 mg/dL (ref 6–23)
CO2: 24 mEq/L (ref 19–32)
Chloride: 106 mEq/L (ref 96–112)
Creatinine, Ser: 0.76 mg/dL (ref 0.50–1.10)
GFR calc Af Amer: >90 mL/min (ref >90)
Potassium: 3.3 mEq/L — ABNORMAL LOW (ref 3.5–5.1)

## 2011-03-04 LAB — CBC
HCT: 29.7 % — ABNORMAL LOW (ref 36.0–46.0)
Hemoglobin: 9.9 g/dL — ABNORMAL LOW (ref 12.0–15.0)
MCHC: 33.3 g/dL (ref 30.0–36.0)
MCV: 84.4 fL (ref 78.0–100.0)
RDW: 15.4 % (ref 11.5–15.5)
WBC: 3.8 10*3/uL — ABNORMAL LOW (ref 4.0–10.5)

## 2011-03-04 MED ORDER — TRAMADOL HCL 50 MG PO TABS: 25.0000 mg | ORAL_TABLET | Freq: Four times a day (QID) | ORAL | Status: DC | PRN

## 2011-03-04 MED ORDER — POTASSIUM CHLORIDE CRYS ER 20 MEQ PO TBCR
40.0000 meq | EXTENDED_RELEASE_TABLET | Freq: Once | ORAL | Status: AC
  Administered 2011-03-04: 40 meq via ORAL
  Filled 2011-03-04: qty 2

## 2011-03-04 MED ORDER — PANTOPRAZOLE SODIUM 40 MG PO TBEC
40.0000 mg | DELAYED_RELEASE_TABLET | Freq: Two times a day (BID) | ORAL | Status: DC
  Administered 2011-03-04 – 2011-03-05 (×2): 40 mg via ORAL
  Filled 2011-03-04 (×3): qty 1

## 2011-03-04 NOTE — Progress Notes (Signed)
[facility] Progress Note  Subjective: Pt feels somewhat better today. Still with epigastric burning pain.  One episode of vomiting this am after eating broth.  Nonbloody.  Appetite still poor.  Tolerating ginger ale.  No fevers.  Objective:  Vital signs in last 24 hours: Temp:  [98.3 F (36.8 C)-98.6 F (37 C)] 98.6 F (37 C) (12/08 6213) Pulse Rate:  [73-75] 75  (12/08 0652) Resp:  [13-18] 16  (12/08 0652) BP: (115-138)/(78-108) 115/78 mmHg (12/08 0652) SpO2:  [99 %-100 %] 99 % (12/08 0652) Weight:  [174 lb 4.8 oz (79.062 kg)] 174 lb 4.8 oz (79.062 kg) (12/08 0865) Last BM Date: 03/02/11 General:   Alert,  Well-developed,  female in NAD Heart:  Regular rate and rhythm; no murmurs Abdomen:  Soft, epigastric tenderness, and nondistended. Normal bowel sounds, without guarding, and without rebound.   Extremities:  Without edema. Neurologic:  Alert and  oriented x4;  grossly normal neurologically. Psych:  Alert and cooperative. Normal mood and affect.  Intake/Output from previous day: 12/07 0701 - 12/08 0700 In: 2964.6 [P.O.:225; I.V.:2739.6] Out: -   Lab Results:  Basename 03/04/11 0350 03/03/11 0333 03/02/11 1040  WBC 3.8* 3.5* 3.9*  HGB 9.9* 10.0* 11.2*  HCT 29.7* 30.4* 34.4*  PLT 293 292 317   BMET  Basename 03/04/11 0350 03/03/11 0333 03/02/11 1040  NA 137 136 136  K 3.3* 3.2* 3.3*  CL 106 106 101  CO2 24 25 27   GLUCOSE 82 79 84  BUN 6 6 8   CREATININE 0.76 0.85 0.80  CALCIUM 8.4 8.3* 9.2   LFT  Basename 03/03/11 0333 03/02/11 1851  PROT 6.1 --  ALBUMIN 2.9* --  AST 17 --  ALT 14 --  ALKPHOS 54 --  BILITOT 0.1* --  BILIDIR -- <0.1  IBILI -- NOT CALCULATED   PT/INR  Basename 03/03/11 0333  LABPROT 13.2  INR 0.98    Studies/Results: Acute Abdominal Series  03/02/2011  *RADIOLOGY REPORT*  Clinical Data: Nausea, vomiting.  ACUTE ABDOMEN SERIES (ABDOMEN 2 VIEW & CHEST 1 VIEW)  Comparison: 03/02/2011 CT.  Chest x-ray 01/26/2011.  Findings:  Prior cholecystectomy. The bowel gas pattern is normal. There is no evidence of free intraperitoneal air.  No suspicious radio-opaque calculi or other significant radiographic abnormality is seen. Heart size and mediastinal contours are within normal limits.  Both lungs are clear.  IMPRESSION: No acute findings or change since CT earlier today.  Original Report Authenticated By: Cyndie Chime, M.D.     Assessment / Plan: 35 yo with fairly recent cholecystectomy here with epigastric pain, burning found to have mild gastritis on EGD, but no ulcer dx found.  1. Epigastric pain - has had imaging and now repeat EGD.  Mild gastritis with bx pending to exclude h. Pylori.  She was not on PPI prior to admit, and I think this is reasonable.  Would cont daily use to see if this improves symptoms.  Otherwise, symptom control for now.  Await path. Advance diet as tolerated.  2. Right sided abd/chest pain - this pain is likely musculoskeletal given its nature.  [place] and CT negative which is reassuring.  Consider tramadol for this pain.  Call with questions.    LOS: 2 days   Orlin Kann M  03/04/2011, 1:18 PM

## 2011-03-04 NOTE — Progress Notes (Signed)
Subjective: Patient states some nausea and a little emesis this morning, with burning abdominal pain  Objective: Vital signs in last 24 hours: Filed Vitals:   03/03/11 1620 03/03/11 1630 03/03/11 2208 03/04/11 0652  BP: 134/88 132/88 126/85 115/78  Pulse:   73 75  Temp:   98.3 F (36.8 C) 98.6 F (37 C)  TempSrc:   Oral Oral  Resp: 15 13 17 16   Height:      Weight:    79.062 kg (174 lb 4.8 oz)  SpO2: 100% 100% 100% 99%    Intake/Output Summary (Last 24 hours) at 03/04/11 1326 Last data filed at 03/04/11 0900  Gross per 24 hour  Intake 3214.58 ml  Output      0 ml  Net 3214.58 ml    Weight change: -0.318 kg (-11.2 oz)  General: Alert, awake, oriented x3, in no acute distress. Heart: Regular rate and rhythm, without murmurs, rubs, gallops. Lungs: Clear to auscultation bilaterally. Abdomen: Soft, R lower ribs with some TTP, epigastric region very TTP, nondistended, positive bowel sounds. Extremities: No clubbing cyanosis or edema with positive pedal pulses. Neuro: Grossly intact, nonfocal.   Lab Results:  Basename 03/04/11 0350 03/03/11 0333 03/02/11 1851  NA 137 136 --  K 3.3* 3.2* --  CL 106 106 --  CO2 24 25 --  GLUCOSE 82 79 --  BUN 6 6 --  CREATININE 0.76 0.85 --  CALCIUM 8.4 8.3* --  MG -- 1.8 1.9  PHOS -- -- --    Basename 03/03/11 0333 03/02/11 1851  AST 17 18  ALT 14 16  ALKPHOS 54 56  BILITOT 0.1* 0.2*  PROT 6.1 6.4  ALBUMIN 2.9* 3.1*    Basename 03/02/11 1040  LIPASE 28  AMYLASE --    Basename 03/04/11 0350 03/03/11 0333  WBC 3.8* 3.5*  NEUTROABS 1.3* --  HGB 9.9* 10.0*  HCT 29.7* 30.4*  MCV 84.4 84.9  PLT 293 292   No results found for this basename: CKTOTAL:3,CKMB:3,CKMBINDEX:3,TROPONINI:3 in the last 72 hours No results found for this basename: POCBNP:3 in the last 72 hours No results found for this basename: DDIMER:2 in the last 72 hours No results found for this basename: HGBA1C:2 in the last 72 hours No results found for this  basename: CHOL:2,HDL:2,LDLCALC:2,TRIG:2,CHOLHDL:2,LDLDIRECT:2 in the last 72 hours No results found for this basename: TSH,T4TOTAL,FREET3,T3FREE,THYROIDAB in the last 72 hours  Basename 03/02/11 1851  VITAMINB12 735  FOLATE 10.7  FERRITIN 16  TIBC 259  IRON 54  RETICCTPCT --    Micro Results: No results found for this or any previous visit (from the past 240 hour(s)).  Studies/Results: Acute Abdominal Series  03/02/2011  *RADIOLOGY REPORT*  Clinical Data: Nausea, vomiting.  ACUTE ABDOMEN SERIES (ABDOMEN 2 VIEW & CHEST 1 VIEW)  Comparison: 03/02/2011 CT.  Chest x-ray 01/26/2011.  Findings: Prior cholecystectomy. The bowel gas pattern is normal. There is no evidence of free intraperitoneal air.  No suspicious radio-opaque calculi or other significant radiographic abnormality is seen. Heart size and mediastinal contours are within normal limits.  Both lungs are clear.  IMPRESSION: No acute findings or change since CT earlier today.  Original Report Authenticated By: Cyndie Chime, M.D.    Medications:     . docusate sodium  100 mg Oral BID  . hydrochlorothiazide  25 mg Oral Daily  . ketorolac  30 mg Intravenous Q6H  . pantoprazole (PROTONIX) IV  40 mg Intravenous Q12H  . potassium chloride  40 mEq Oral Daily  .  potassium chloride  40 mEq Oral Q4H  . potassium chloride  40 mEq Oral Once    Assessment/Plan Abdominal pain, epigastric Active Problems:  Nausea & vomiting  HTN (hypertension)  Hypokalemia  Anemia  #1. Abdominal Pain, epigastric- unknown etiology. Likely GI, as pain associated with meals and burning in nature.  S/p EGD with gastritis in antrum. Biopsies pending.Patient still with abdominal pain. Lipase levels WNL. Abdominal xray unremarkable. Continue scheduled toradol, Increase protonix to BID, IVF, supportive care, antiemetics. Advance diet to full liquids. ? carafate will defer to GI. GI ff and appreciate input and rxcs. G Follow. #2. HTN - Continue HCTZ #3.  Hypokalemia - secondary to diuretic. Replete. #4. Anemia- No overt GI bleed. Follow H/H. #5. Prophylaxis- PPI for GI, SCD for DVT.     LOS: 2 days   Kamber Vignola 03/04/2011, 1:26 PM

## 2011-03-05 DIAGNOSIS — R1013 Epigastric pain: Secondary | ICD-10-CM

## 2011-03-05 DIAGNOSIS — R112 Nausea with vomiting, unspecified: Secondary | ICD-10-CM

## 2011-03-05 LAB — BASIC METABOLIC PANEL
BUN: 4 mg/dL — ABNORMAL LOW (ref 6–23)
CO2: 24 mEq/L (ref 19–32)
Calcium: 8.3 mg/dL — ABNORMAL LOW (ref 8.4–10.5)
Chloride: 107 mEq/L (ref 96–112)
Creatinine, Ser: 0.78 mg/dL (ref 0.50–1.10)

## 2011-03-05 LAB — CBC
HCT: 28 % — ABNORMAL LOW (ref 36.0–46.0)
MCH: 28.1 pg (ref 26.0–34.0)
MCHC: 33.2 g/dL (ref 30.0–36.0)
MCV: 84.6 fL (ref 78.0–100.0)
Platelets: 280 10*3/uL (ref 150–400)
RDW: 15.7 % — ABNORMAL HIGH (ref 11.5–15.5)

## 2011-03-05 MED ORDER — POTASSIUM CHLORIDE CRYS ER 20 MEQ PO TBCR: 40.0000 meq | EXTENDED_RELEASE_TABLET | Freq: Every day | ORAL | Status: DC

## 2011-03-05 MED ORDER — POTASSIUM CHLORIDE CRYS ER 20 MEQ PO TBCR
40.0000 meq | EXTENDED_RELEASE_TABLET | Freq: Once | ORAL | Status: AC
  Administered 2011-03-05: 40 meq via ORAL
  Filled 2011-03-05: qty 2

## 2011-03-05 MED ORDER — TRAMADOL HCL 50 MG PO TABS: 25.0000 mg | ORAL_TABLET | Freq: Four times a day (QID) | ORAL | Status: AC | PRN

## 2011-03-05 MED ORDER — PANTOPRAZOLE SODIUM 40 MG PO TBEC: 40.0000 mg | DELAYED_RELEASE_TABLET | Freq: Two times a day (BID) | ORAL | Status: DC

## 2011-03-05 NOTE — Progress Notes (Signed)
I agree with the above documentation, including the assessment and plan.  

## 2011-03-05 NOTE — Progress Notes (Signed)
Cm spoke with pt concerning d/c planning. Pt self-pay, concerned about going home on protonix and zofran. Pt states being allergic to phenergan. Pt eligible for indigent funds upon d/c. Pt has PCP, Research scientist (life sciences) at [facility]. Pt given list for generic drugs at [facility] which she will be able to receive free 14 day supply of antibiotics if discharged on antibiotics for H-Pylori. Pt instructed to have RN contact CM at discharge if medication assistance needed. Pt declined assistance form uninsured program at [facility].

## 2011-03-05 NOTE — Discharge Summary (Signed)
Discharge Summary  LAVETA GILKEY MR#: [id number]  DOB:[birth year]  Date of Admission: 03/02/2011 Date of Discharge: 03/05/2011  Patient's PCP: Allean Found, MD  Attending Physician:THOMPSON,DANIEL  Consults: Treatment Team:  Gastroenterology: Erick Blinks, MD  Discharge Diagnoses: Abdominal pain, epigastric Present on Admission:  .Nausea & vomiting .HTN (hypertension) .Hypokalemia .Anemia .Abdominal pain, epigastric .Gastritis .Musculoskeletal pain   Brief Admitting History and Physical Victoria George is a pleasant 35 year old African American female history of hypertension status post recent lap cholecystectomy 02/01/2011 per Dr. Abbey Chatters, who presents to the ED with worsening epigastric abdominal pain. Patient states that after her cholecystectomy was doing fairly well until approximately one to 2 weeks prior to admission she started to have some right upper quadrant abdominal pain, with nausea and emesis. Patient presented to [facility] where she states that abdominal ultrasound was done which was negative, she was placed on soma Percocet and the discharged home. Patient continued to have pain and a such made an appointment to be seen by her general surgeon Dr. Abbey Chatters. Patient states she was seen by Dr. Abbey Chatters on 02/28/2011, and states that at that time it was felt her pain was musculoskeletal versus hematoma in the nature. Patient was advised to apply heat to the area avoid twisting and bending and placed on NSAIDs twice daily as well as anti-medics. Patient states that ever since then had abdominal pain has become more epigastric in nature more midline which is burning sensation associated with nausea emesis and inability to keep anything down. Patient states abdominal pain is associated with oral intake. Patient denies any hematemesis, no fever, no chills, no chest pain, no shortness of breath, no diarrhea, no dysuria, no weakness. Patient does endorse  some mild constipation and states has to use a stool softener in order to have a bowel movement. Patient also endorses some dizziness. Patient was seen in the ED on 02/24/2011 at which point in time a CT scan of the abdomen was done which was unremarkable. Patient at that time was subsequently discharged home. Patient was seen today in the ED on the day of admission, was given some Dilaudid as well as IV Pepcid and a GI cocktail, however as patient was unable to keep anything down and had persistent nausea and vomiting we will called to admit the patient for further evaluation and management.    Discharge Medications Medication List  As of 03/05/2011  4:17 PM   START taking these medications         metoCLOPramide 10 MG tablet   Commonly known as: REGLAN   Take 1 tablet (10 mg total) by mouth every 6 (six) hours.      pantoprazole 40 MG tablet   Commonly known as: PROTONIX   Take 1 tablet (40 mg total) by mouth 2 (two) times daily before a meal.      potassium chloride SA 20 MEQ tablet   Commonly known as: K-DUR,KLOR-CON   Take 2 tablets (40 mEq total) by mouth daily.      traMADol 50 MG tablet   Commonly known as: ULTRAM   Take 0.5 tablets (25 mg total) by mouth every 6 (six) hours as needed. Maximum dose= 8 tablets per dayTake 0.5 tablets (25 mg total) by mouth every 6 (six) hours as needed for moderate pain. If adult CrCl > 30, max dose = 400 mg/day; if CrCl < 30, max dose = 200 mg/day         CONTINUE taking these medications  docusate sodium 100 MG capsule   Commonly known as: COLACE      hydrochlorothiazide 25 MG tablet   Commonly known as: HYDRODIURIL      ondansetron 4 MG tablet   Commonly known as: ZOFRAN   Take 1 tablet (4 mg total) by mouth every 8 (eight) hours as needed. For nausea      oxyCODONE-acetaminophen 5-325 MG per tablet   Commonly known as: PERCOCET          Where to get your medications    These are the prescriptions that you need to pick up.  We sent them to a specific pharmacy, so you will need to go there to get them.   [id number] - [place] ([place]), [place] - [address]    [address] [place] ([place]) [place] [address]    Phone: [phone]        metoCLOPramide 10 MG tablet         You may get these medications from any pharmacy.         pantoprazole 40 MG tablet   potassium chloride SA 20 MEQ tablet   traMADol 50 MG tablet           Hospital Course: #1.Abdominal pain, epigastric  Patient was admitted with abdominal pain more epigastric greater than right upper quadrant pain, with associated nausea and vomiting. Patient was placed on a clear liquid diet she was placed on a proton pump inhibitor via IV ,placed on antiemetics, as well as IV Toradol scheduled for 48 hours. An abdominal x-rays were done which were unremarkable. A GI consultation was obtained, patient was seen in consultation by Dr. Chestine Spore of the [facility] on 03/03/2011, an EGD was recommended and patient underwent a upper endoscopy on 03/03/2011. Upper endoscopy results did show mild gastritis in the antrum biopsies were taken and are pending at the time of this dictation. Biopsies will be followed up upon per GI. Patient has been recommended to avoid NSAIDs and she was maintained on a PPI. Patient's diet was slowly advanced, a proton pump inhibitor was increased to twice daily, with symptomatic improvement. Patient was able to tolerate a bland diet without any emesis and no abdominal pain. Patient will followup with GI as previously scheduled. Patient be discharged home in stable and improved condition. #2.Right upper quadrant musculoskeletal pain On admission patient did have some complaints of right upper quadrant pain. On examination patient was noted to be tender in the lower right rib area with palpation. Patient had been seen previously by Dr. Purnell Shoemaker  of general surgery in reference to this and it was felt this was likely  musculoskeletal in nature. Patient was placed on scheduled IV Toradol during this hospitalization with improvement in her pain. Patient will be discharged on Ultram as needed for the right upper quadrant pain. #3 hypokalemia Patient was noted to be hypokalemic during this hospitalization. It was felt it was likely secondary to her diuretics. Patient has been placed on oral potassium supplements. Patient will need to followup with PCP as outpatient. #4 hypertension Remained controlled during this hospitalization. Patient was maintained on a home regimen of hydrochlorothiazide. The rest of patient's chronic medical issues have remained stable throughout the hospitalization the patient will be discharged home in stable and improved condition. It's been a pleasure taking care of Ms. Potts.  Present on Admission:  .Nausea & vomiting .HTN (hypertension) .Hypokalemia .Anemia .Abdominal pain, epigastric .Gastritis .Musculoskeletal pain   Day of  Discharge BP 128/84  Pulse 81  Temp(Src) 98.6 F (37 C) (Oral)  Resp 20  Ht 5\' 1"  (1.549 m)  Wt 79.062 kg (174 lb 4.8 oz)  BMI 32.93 kg/m2  SpO2 100%  LMP 02/21/2011 Subjective: Patient states abdominal pain has improved. Patient tolerating solid foods without any abdominal pain. No no emesis. Some nausea this morning however has since resolved. General: Alert, awake, oriented x3, in no acute distress. HEENT: No bruits, no goiter. Heart: Regular rate and rhythm, without murmurs, rubs, gallops. Lungs: Clear to auscultation bilaterally. Abdomen: Soft, minimal to mildly tender to palpation in the epigastric region, nondistended, positive bowel sounds. Extremities: No clubbing cyanosis or edema with positive pedal pulses. Neuro: Grossly intact, nonfocal.   Results for orders placed during the hospital encounter of 03/02/11 (from the past 48 hour(s))  BASIC METABOLIC PANEL     Status: Abnormal   Collection Time   03/04/11  3:50 AM      Component  Value Range Comment   Sodium 137  135 - 145 (mEq/L)    Potassium 3.3 (*) 3.5 - 5.1 (mEq/L)    Chloride 106  96 - 112 (mEq/L)    CO2 24  19 - 32 (mEq/L)    Glucose, Bld 82  70 - 99 (mg/dL)    BUN 6  6 - 23 (mg/dL)    Creatinine, Ser 1.61  0.50 - 1.10 (mg/dL)    Calcium 8.4  8.4 - 10.5 (mg/dL)    GFR calc non Af Amer >90  >90 (mL/min)    GFR calc Af Amer >90  >90 (mL/min)   CBC     Status: Abnormal   Collection Time   03/04/11  3:50 AM      Component Value Range Comment   WBC 3.8 (*) 4.0 - 10.5 (K/uL)    RBC 3.52 (*) 3.87 - 5.11 (MIL/uL)    Hemoglobin 9.9 (*) 12.0 - 15.0 (g/dL)    HCT 09.6 (*) 04.5 - 46.0 (%)    MCV 84.4  78.0 - 100.0 (fL)    MCH 28.1  26.0 - 34.0 (pg)    MCHC 33.3  30.0 - 36.0 (g/dL)    RDW 40.9  81.1 - 91.4 (%)    Platelets 293  150 - 400 (K/uL)   DIFFERENTIAL     Status: Abnormal   Collection Time   03/04/11  3:50 AM      Component Value Range Comment   Neutrophils Relative 35 (*) 43 - 77 (%)    Neutro Abs 1.3 (*) 1.7 - 7.7 (K/uL)    Lymphocytes Relative 51 (*) 12 - 46 (%)    Lymphs Abs 2.0  0.7 - 4.0 (K/uL)    Monocytes Relative 12  3 - 12 (%)    Monocytes Absolute 0.4  0.1 - 1.0 (K/uL)    Eosinophils Relative 2  0 - 5 (%)    Eosinophils Absolute 0.1  0.0 - 0.7 (K/uL)    Basophils Relative 1  0 - 1 (%)    Basophils Absolute 0.0  0.0 - 0.1 (K/uL)   BASIC METABOLIC PANEL     Status: Abnormal   Collection Time   03/05/11  4:02 AM      Component Value Range Comment   Sodium 138  135 - 145 (mEq/L)    Potassium 3.4 (*) 3.5 - 5.1 (mEq/L)    Chloride 107  96 - 112 (mEq/L)    CO2 24  19 - 32 (mEq/L)  Glucose, Bld 96  70 - 99 (mg/dL)    BUN 4 (*) 6 - 23 (mg/dL)    Creatinine, Ser 4.09  0.50 - 1.10 (mg/dL)    Calcium 8.3 (*) 8.4 - 10.5 (mg/dL)    GFR calc non Af Amer >90  >90 (mL/min)    GFR calc Af Amer >90  >90 (mL/min)   CBC     Status: Abnormal   Collection Time   03/05/11  4:02 AM      Component Value Range Comment   WBC 3.6 (*) 4.0 - 10.5 (K/uL)     RBC 3.31 (*) 3.87 - 5.11 (MIL/uL)    Hemoglobin 9.3 (*) 12.0 - 15.0 (g/dL)    HCT 81.1 (*) 91.4 - 46.0 (%)    MCV 84.6  78.0 - 100.0 (fL)    MCH 28.1  26.0 - 34.0 (pg)    MCHC 33.2  30.0 - 36.0 (g/dL)    RDW 78.2 (*) 95.6 - 15.5 (%)    Platelets 280  150 - 400 (K/uL)     Ct Abdomen Pelvis W Contrast  02/24/2011  *RADIOLOGY REPORT*  Clinical Data: Abdominal pain.  Cholecystectomy approximately 3 weeks ago.  Right upper quadrant pain.  CT ABDOMEN AND PELVIS WITH CONTRAST  Technique:  Multidetector CT imaging of the abdomen and pelvis was performed following the standard protocol during bolus administration of intravenous contrast.  Contrast: OMNIPAQUE IOHEXOL 300 MG/ML IV SOLN  Comparison: 01/27/2011  Findings: Lung bases are clear.  No effusions.  Heart is normal size.  Liver, spleen, pancreas, adrenals, kidneys are unremarkable.  Tiny cyst off the lower pole of the right kidney.  No hydronephrosis.  Changes of cholecystectomy.  No fluid collections in the gallbladder fossa or adjacent to the liver.  Aorta is normal caliber.  Large and small bowel are unremarkable.  Appendix is visualized and is normal.  Large hypodense area within the left side of the uterus is stable compatible with fibroid.  This measures 3.7 cm.  Small amount of free fluid in the pelvis.  There is an exophytic fibroid off the posterior aspect of the uterus measuring 2.7 cm.  Adnexa grossly unremarkable.  Urinary bladder is decompressed and unremarkable.  No acute bony abnormality.  IMPRESSION: Prior cholecystectomy.  Uterine fibroids.  No acute findings.  Original Report Authenticated By: Cyndie Chime, M.D.   Acute Abdominal Series  03/02/2011  *RADIOLOGY REPORT*  Clinical Data: Nausea, vomiting.  ACUTE ABDOMEN SERIES (ABDOMEN 2 VIEW & CHEST 1 VIEW)  Comparison: 03/02/2011 CT.  Chest x-ray 01/26/2011.  Findings: Prior cholecystectomy. The bowel gas pattern is normal. There is no evidence of free intraperitoneal air.  No  suspicious radio-opaque calculi or other significant radiographic abnormality is seen. Heart size and mediastinal contours are within normal limits.  Both lungs are clear.  IMPRESSION: No acute findings or change since CT earlier today.  Original Report Authenticated By: Cyndie Chime, M.D.     Disposition: Home  Diet: Bland  Activity: Increase activity slowly.   Follow-up Appts: Discharge Orders    Future Appointments: Provider: Department: Dept Phone: Center:   03/23/2011 2:30 PM Erick Blinks, MD [facility] [phone] [facility]     Future Orders Please Complete By Expires   Diet general      Comments:   BLAND diet for at least 4-5 days, then regular if tolerated.   Increase activity slowly      Discharge instructions      Comments:  Follow up with Dr Rhea Belton, [facility] as scheduled.      TESTS THAT NEED FOLLOW-UP CBC to f/u on H/H, BMET to f/u on electrolytes.  Time spent on discharge, talking to the patient, and coordinating care: 60 mins.   SignedRamiro Harvest 03/05/2011, 4:17 PM

## 2011-03-05 NOTE — Progress Notes (Signed)
     [facility] Daily Rounding Note 03/05/2011, 1:05 PM  SUBJECTIVE Epigstric pain and nausea better.  Still some lingering epigastric burning.  Eating solids OBJECTIVE: Looks well.  Vital signs in last 24 hours: Temp:  [98.2 F (36.8 C)-98.4 F (36.9 C)] 98.2 F (36.8 C) (12/09 0505) Pulse Rate:  [69-74] 74  (12/09 0505) Resp:  [16-18] 16  (12/09 0505) BP: (109-119)/(75-81) 109/75 mmHg (12/09 0505) SpO2:  [100 %] 100 % (12/09 0505) Last BM Date: 02/28/11  Heart: RRR Chest: Clear Abdomen: Soft, Epigastric tenderness but no guarding/rebound  Extremities: no edema Neuro/Psych:  Relaxed, cooperative.  Intake/Output from previous day: 12/08 0701 - 12/09 0700 In: 1565 [P.O.:850; I.V.:715] Out: -   Intake/Output this shift: Total I/O In: 400 [P.O.:400] Out: -   Lab Results:  Basename 03/05/11 0402 03/04/11 0350 03/03/11 0333  WBC 3.6* 3.8* 3.5*  HGB 9.3* 9.9* 10.0*  HCT 28.0* 29.7* 30.4*  PLT 280 293 292   BMET  Basename 03/05/11 0402 03/04/11 0350 03/03/11 0333  NA 138 137 136  K 3.4* 3.3* 3.2*  CL 107 106 106  CO2 24 24 25   GLUCOSE 96 82 79  BUN 4* 6 6  CREATININE 0.78 0.76 0.85  CALCIUM 8.3* 8.4 8.3*   LFT  Basename 03/03/11 0333 03/02/11 1851  PROT 6.1 --  ALBUMIN 2.9* --  AST 17 --  ALT 14 --  ALKPHOS 54 --  BILITOT 0.1* --  BILIDIR -- <0.1  IBILI -- NOT CALCULATED   PT/INR  Basename 03/03/11 0333  LABPROT 13.2  INR 0.98   ASSESMENT: 1.  Gastritis:  sxs of epigastric pain and nausea improved.  Needs to stay on daily PPI.  If H Pylori positive on Bx she will need abx added.  She is well enough to go home today.  2.  Normocytic anemia.  Iron studies, b12 and folate are normal.  Fluids have been running at 100 ml/hour for 4 days, so suspect dilutional effect.  PLAN: 1.  Home today.  Daily PPI.  GI follow up PRN, can follow up first with Dr Merri Brunette, her primary MD   LOS: 3 days   Jennye Moccasin  03/05/2011, 1:05 PM Pager: [phone]

## 2011-03-06 ENCOUNTER — Encounter (HOSPITAL_COMMUNITY): Payer: Self-pay | Admitting: Internal Medicine

## 2011-03-08 ENCOUNTER — Encounter: Payer: Self-pay | Admitting: Internal Medicine

## 2011-03-14 ENCOUNTER — Encounter: Payer: Self-pay | Admitting: Internal Medicine

## 2011-03-23 ENCOUNTER — Encounter: Payer: Self-pay | Admitting: Internal Medicine

## 2011-03-23 ENCOUNTER — Ambulatory Visit (INDEPENDENT_AMBULATORY_CARE_PROVIDER_SITE_OTHER): Payer: Self-pay | Admitting: Internal Medicine

## 2011-03-23 VITALS — BP 116/84 | HR 88 | Ht 61.0 in | Wt 183.0 lb

## 2011-03-23 DIAGNOSIS — K219 Gastro-esophageal reflux disease without esophagitis: Secondary | ICD-10-CM

## 2011-03-23 DIAGNOSIS — K3189 Other diseases of stomach and duodenum: Secondary | ICD-10-CM

## 2011-03-23 DIAGNOSIS — R1013 Epigastric pain: Secondary | ICD-10-CM

## 2011-03-23 DIAGNOSIS — R110 Nausea: Secondary | ICD-10-CM

## 2011-03-23 MED ORDER — ONDANSETRON HCL 4 MG PO TABS: 4.0000 mg | ORAL_TABLET | Freq: Three times a day (TID) | ORAL | Status: DC | PRN

## 2011-03-23 NOTE — Progress Notes (Signed)
Subjective:    Patient ID: Victoria George, female    DOB: 1975/11/07, 35 y.o.   MRN: [id number]  HPI 35 yo female seen in hospital followup after admission for epigastric pain nausea and vomiting. She underwent upper endoscopy on 03/03/2011 which revealed mild antral gastritis. Biopsy was negative for H. pylori. She was started on PPI therapy, with Protonix 40 mg twice daily. She was also given Zofran as needed for nausea. She returns today alone. She reports that she is significantly better and "feels like a new person". She has resolution of her epigastric pain and is not having heartburn or reflux symptoms. She is occasionally having nausea for which she is using Zofran 4 mg when necessary. This is providing good relief of nausea, and again the nausea has been rare and mild. No vomiting. No fevers or chills. She's not having to use Percocet any longer. Bowel movements are normal, formed and brown. No melena or bright red blood per rectum.  In the hospital she was also having right sided upper chest pain in the mid axillary line. This was felt to be mostly musculoskeletal pain, and this too has improved significantly. She reports she has been able to resume a workout regimen without significant pain.  Review of Systems Constitutional: Negative for fever, chills, night sweats, activity change, appetite change and unexpected weight change HEENT: Negative for sore throat, mouth sores and trouble swallowing. Eyes: Negative for visual disturbance Respiratory: Negative for cough, chest tightness and shortness of breath Cardiovascular: Negative for chest pain, palpitations and lower extremity swelling Gastrointestinal: See history of present illness Genitourinary: Negative for dysuria and hematuria. Musculoskeletal: Negative for back pain, arthralgias and myalgias Skin: Negative for rash or color change Neurological: Negative for headaches, weakness, numbness Hematological: Negative for adenopathy,  negative for easy bruising/bleeding Psychiatric/behavioral: Negative for depressed mood, negative for anxiety   Past Medical History  Diagnosis Date  . Hypertension   GERD  Past Surgical History  Procedure Date  . Esophagogastroduodenoscopy 01/30/2011    Procedure: ESOPHAGOGASTRODUODENOSCOPY (EGD);  Surgeon: Erick Blinks, MD;  Location: [facility];  Service: Gastroenterology;  Laterality: N/A;  . Diagnostic laparoscopy   . Cholecystectomy 02/01/2011    Procedure: LAPAROSCOPIC CHOLECYSTECTOMY WITH INTRAOPERATIVE CHOLANGIOGRAM;  Surgeon: Adolph Pollack, MD;  Location: [facility];  Service: General;  Laterality: N/A;  Needs IOC and C-arm  . Esophagogastroduodenoscopy 03/03/2011    Procedure: ESOPHAGOGASTRODUODENOSCOPY (EGD);  Surgeon: Erick Blinks, MD;  Location: [facility];  Service: Gastroenterology;  Laterality: N/A;   Current Outpatient Prescriptions  Medication Sig Dispense Refill  . docusate sodium (COLACE) 100 MG capsule Take 100 mg by mouth 2 (two) times daily as needed. For constipation       . hydrochlorothiazide (HYDRODIURIL) 25 MG tablet Take 25 mg by mouth daily.        . ondansetron (ZOFRAN) 4 MG tablet Take 1 tablet (4 mg total) by mouth every 8 (eight) hours as needed. For nausea  30 tablet  1  . oxyCODONE-acetaminophen (PERCOCET) 5-325 MG per tablet Take 1 tablet by mouth every 6 (six) hours as needed. For pain       . pantoprazole (PROTONIX) 40 MG tablet Take 1 tablet (40 mg total) by mouth 2 (two) times daily before a meal.  62 tablet  0  . potassium chloride SA (K-DUR,KLOR-CON) 20 MEQ tablet Take 2 tablets (40 mEq total) by mouth daily.  60 tablet  0   Allergies  Allergen Reactions  . Shellfish Allergy Anaphylaxis  .  Hydrocodone Rash  . Phenergan Nausea And Vomiting and Palpitations    Social History  . Marital Status: Single   Occupational History  . counselor    Social History Main Topics  . Smoking status: Never Smoker   . Smokeless tobacco: Never Used  .  Alcohol Use: No  . Drug Use: No  . Sexually Active: Yes    Birth Control/ Protection: None   Family History  Problem Relation Age of Onset  . Hypertension Mother   . Anesthesia problems Neg Hx   . Malignant hyperthermia Neg Hx   . Pseudochol deficiency Neg Hx   . Colon cancer Maternal Grandmother   . Diabetes Brother   . Diabetes Maternal Grandmother   . Diabetes Paternal Grandmother   . Diverticulosis Paternal Grandmother   . Kidney disease Maternal Grandmother       Objective:   Physical Exam BP 116/84  Pulse 88  Ht 5\' 1"  (1.549 m)  Wt 183 lb (83.008 kg)  BMI 34.58 kg/m2  LMP 03/20/2011 Constitutional: Well-developed and well-nourished. No distress. HEENT: Normocephalic and atraumatic. Oropharynx is clear and moist. No oropharyngeal exudate. Conjunctivae are normal. Pupils are equal round and reactive to light. No scleral icterus. Neck: Neck supple. Trachea midline. Cardiovascular: Normal rate, regular rhythm and intact distal pulses. No M/R/G Pulmonary/chest: Effort normal and breath sounds normal. No wheezing, rales or rhonchi. Abdominal: Soft, nontender, nondistended. Bowel sounds active throughout. There are no masses palpable. No hepatosplenomegaly. Extremities: no clubbing, cyanosis, or edema Lymphadenopathy: No cervical adenopathy noted. Neurological: Alert and oriented to person place and time. Skin: Skin is warm and dry. No rashes noted. Psychiatric: Normal mood and affect. Behavior is normal.     Assessment & Plan:  35 yo female seen in hospital followup after admission for epigastric pain nausea and vomiting  1. GERD/dyspepsia -- the patient's upper abdominal symptoms have completely resolved except for mild very in her mid nausea on PPI therapy. She is currently taking pantoprazole 40 mg twice daily. She was H. pylori negative by biopsy. At this point I think it is reasonable to decrease her PPI to once daily therapy. She'll continue daily therapy for  approximately 3 months, at which time I advise she can attempt to stop this medication entirely. If reflux, heartburn, or dyspeptic symptoms return, she may need this medication daily chronically. She asks about possible financial assistance for medication, and she is apply for this through the hospital. For now we will refill pantoprazole 40 mg daily and Zofran 4 mg Q8 hours when necessary nausea.  She can follow with her primary care physician, and be seen here as needed

## 2011-03-23 NOTE — Progress Notes (Signed)
Addended by: Jessee Avers on: 03/23/2011 03:15 PM   Modules accepted: Orders

## 2011-03-23 NOTE — Patient Instructions (Signed)
Your prescription for Zofran has been sent to your pharmacy.  Please bring back any medication assistance program forms that we may need to fill out to help with Protonix. cc: Merri Brunette, MD

## 2011-03-23 NOTE — Progress Notes (Signed)
Addended by: Jessee Avers on: 03/23/2011 03:12 PM   Modules accepted: Orders

## 2011-03-23 NOTE — Telephone Encounter (Signed)
Close encounter 

## 2011-05-23 ENCOUNTER — Encounter (INDEPENDENT_AMBULATORY_CARE_PROVIDER_SITE_OTHER): Payer: Self-pay

## 2011-06-15 ENCOUNTER — Encounter (INDEPENDENT_AMBULATORY_CARE_PROVIDER_SITE_OTHER): Payer: Self-pay

## 2011-07-02 ENCOUNTER — Encounter (HOSPITAL_COMMUNITY): Payer: Self-pay | Admitting: *Deleted

## 2011-07-02 ENCOUNTER — Emergency Department (HOSPITAL_COMMUNITY)
Admission: EM | Admit: 2011-07-02 | Discharge: 2011-07-02 | Disposition: A | Discharge status: Home / Self Care | Payer: Self-pay | Attending: Emergency Medicine | Admitting: Emergency Medicine

## 2011-07-02 DIAGNOSIS — B349 Viral infection, unspecified: Secondary | ICD-10-CM

## 2011-07-02 DIAGNOSIS — B9789 Other viral agents as the cause of diseases classified elsewhere: Secondary | ICD-10-CM | POA: Insufficient documentation

## 2011-07-02 DIAGNOSIS — I10 Essential (primary) hypertension: Secondary | ICD-10-CM | POA: Insufficient documentation

## 2011-07-02 DIAGNOSIS — R110 Nausea: Secondary | ICD-10-CM | POA: Insufficient documentation

## 2011-07-02 MED ORDER — ONDANSETRON 8 MG PO TBDP
8.0000 mg | ORAL_TABLET | Freq: Once | ORAL | Status: AC
  Administered 2011-07-02: 8 mg via ORAL
  Filled 2011-07-02: qty 1

## 2011-07-02 MED ORDER — PROMETHAZINE HCL 25 MG PO TABS: 25.0000 mg | ORAL_TABLET | Freq: Four times a day (QID) | ORAL | Status: DC | PRN

## 2011-07-02 MED ORDER — ONDANSETRON 8 MG PO TBDP: 8.0000 mg | ORAL_TABLET | Freq: Three times a day (TID) | ORAL | Status: AC | PRN

## 2011-07-02 NOTE — ED Notes (Signed)
Per pt started productive cough, body aches, nausea, sore throat starting Thursday and becoming worse progressively. Tried taking Mucinex, tylenol cold and flu, nyquil.  Pt reports history of seasonal allergies, but no history of asthma.

## 2011-07-02 NOTE — ED Provider Notes (Signed)
History     CSN: [id number]  Arrival date & time 07/02/11  1478   First MD Initiated Contact with Patient 07/02/11 [phone]      Chief Complaint  Patient presents with  . Cough  . Generalized Body Aches  . Nausea  . Sore Throat     The history is provided by the patient.   the patient reports 48-72 hours of myalgias nausea sore throat cough and runny nose.  She denies shortness of breath.  She denies abdominal pain.  She's had no vomiting.  She denies diarrhea.  She has no urinary frequency or dysuria.  No vaginal complaints.  She does have a history of hypertension.  She reports recent sick contacts with similar symptoms.  Nothing worsens her symptoms.  Nothing improves her symptoms.  Her symptoms are constant  Past Medical History  Diagnosis Date  . Hypertension     Past Surgical History  Procedure Date  . Esophagogastroduodenoscopy 01/30/2011    Procedure: ESOPHAGOGASTRODUODENOSCOPY (EGD);  Surgeon: Erick Blinks, MD;  Location: [facility];  Service: Gastroenterology;  Laterality: N/A;  . Diagnostic laparoscopy   . Cholecystectomy 02/01/2011    Procedure: LAPAROSCOPIC CHOLECYSTECTOMY WITH INTRAOPERATIVE CHOLANGIOGRAM;  Surgeon: Adolph Pollack, MD;  Location: [facility];  Service: General;  Laterality: N/A;  Needs IOC and C-arm  . Esophagogastroduodenoscopy 03/03/2011    Procedure: ESOPHAGOGASTRODUODENOSCOPY (EGD);  Surgeon: Erick Blinks, MD;  Location: [facility];  Service: Gastroenterology;  Laterality: N/A;    Family History  Problem Relation Age of Onset  . Hypertension Mother   . Anesthesia problems Neg Hx   . Malignant hyperthermia Neg Hx   . Pseudochol deficiency Neg Hx   . Colon cancer Maternal Grandmother   . Diabetes Brother   . Diabetes Maternal Grandmother   . Diabetes Paternal Grandmother   . Diverticulosis Paternal Grandmother   . Kidney disease Maternal Grandmother     History  Substance Use Topics  . Smoking status: Never Smoker   . Smokeless tobacco:  Never Used  . Alcohol Use: No    OB History    Grav Para Term Preterm Abortions TAB SAB Ect Mult Living   0               Review of Systems  Respiratory: Positive for cough.   All other systems reviewed and are negative.    Allergies  Shellfish allergy; Hydrocodone; and Phenergan  Home Medications   Current Outpatient Rx  Name Route Sig Dispense Refill  . DOCUSATE SODIUM 100 MG PO CAPS Oral Take 100 mg by mouth 2 (two) times daily as needed. For constipation     . HYDROCHLOROTHIAZIDE 25 MG PO TABS Oral Take 25 mg by mouth daily.      Marland Kitchen ONDANSETRON HCL 4 MG PO TABS Oral Take 1 tablet (4 mg total) by mouth every 8 (eight) hours as needed. For nausea 30 tablet 1  . OXYCODONE-ACETAMINOPHEN 5-325 MG PO TABS Oral Take 1 tablet by mouth every 6 (six) hours as needed. For pain     . PANTOPRAZOLE SODIUM 40 MG PO TBEC Oral Take 1 tablet (40 mg total) by mouth 2 (two) times daily before a meal. 62 tablet 0  . POTASSIUM CHLORIDE CRYS ER 20 MEQ PO TBCR Oral Take 2 tablets (40 mEq total) by mouth daily. 60 tablet 0  . PROMETHAZINE HCL 25 MG PO TABS Oral Take 1 tablet (25 mg total) by mouth every 6 (six) hours as needed for nausea. 30 tablet  0    BP 118/85  Pulse 94  Temp(Src) 98.7 F (37.1 C) (Oral)  SpO2 99%  LMP 06/11/2011  Physical Exam  Nursing note and vitals reviewed. Constitutional: She is oriented to person, place, and time. She appears well-developed and well-nourished. No distress.  HENT:  Head: Normocephalic and atraumatic.  Eyes: EOM are normal.  Neck: Normal range of motion.  Cardiovascular: Normal rate, regular rhythm and normal heart sounds.   Pulmonary/Chest: Effort normal and breath sounds normal.  Abdominal: Soft. She exhibits no distension. There is no tenderness.  Musculoskeletal: Normal range of motion.  Neurological: She is alert and oriented to person, place, and time.  Skin: Skin is warm and dry.  Psychiatric: She has a normal mood and affect. Judgment  normal.    ED Course  Procedures (including critical care time)  Labs Reviewed - No data to display No results found.   1. Viral syndrome   2. Nausea       MDM  The patient is well-appearing.  This appears to be a viral syndrome.  Anti-medics given.  DC home with close PCP followup.  Her vital signs are normal.  Her lungs are clear.        Lyanne Co, MD 07/02/11 [phone]

## 2012-01-03 ENCOUNTER — Encounter: Payer: Self-pay | Admitting: Internal Medicine

## 2012-01-05 ENCOUNTER — Ambulatory Visit (INDEPENDENT_AMBULATORY_CARE_PROVIDER_SITE_OTHER): Payer: PRIVATE HEALTH INSURANCE | Admitting: Internal Medicine

## 2012-01-05 ENCOUNTER — Encounter: Payer: Self-pay | Admitting: Internal Medicine

## 2012-01-05 VITALS — BP 110/80 | HR 88 | Ht 61.0 in | Wt 201.0 lb

## 2012-01-05 DIAGNOSIS — R142 Eructation: Secondary | ICD-10-CM

## 2012-01-05 DIAGNOSIS — K5900 Constipation, unspecified: Secondary | ICD-10-CM

## 2012-01-05 DIAGNOSIS — K219 Gastro-esophageal reflux disease without esophagitis: Secondary | ICD-10-CM

## 2012-01-05 DIAGNOSIS — R110 Nausea: Secondary | ICD-10-CM

## 2012-01-05 DIAGNOSIS — R140 Abdominal distension (gaseous): Secondary | ICD-10-CM

## 2012-01-05 MED ORDER — LINACLOTIDE 145 MCG PO CAPS: 145.0000 ug | ORAL_CAPSULE | Freq: Every day | ORAL | Status: DC

## 2012-01-05 MED ORDER — PANTOPRAZOLE SODIUM 40 MG PO TBEC: 40.0000 mg | DELAYED_RELEASE_TABLET | Freq: Every day | ORAL | Status: DC

## 2012-01-05 MED ORDER — ALIGN PO CAPS: 1.0000 | ORAL_CAPSULE | Freq: Every day | ORAL | Status: DC

## 2012-01-05 MED ORDER — ONDANSETRON HCL 4 MG PO TABS: 4.0000 mg | ORAL_TABLET | Freq: Three times a day (TID) | ORAL | Status: DC | PRN

## 2012-01-05 MED ORDER — HYDROCHLOROTHIAZIDE 25 MG PO TABS: 25.0000 mg | ORAL_TABLET | Freq: Every day | ORAL | Status: DC

## 2012-01-05 NOTE — Patient Instructions (Addendum)
We have sent the following medications to your pharmacy for you to pick up at your convenience: align, linzess, & zofran.  Please take all medications as prescribed.  Call us in two weeks to let us know how you are doing.

## 2012-01-05 NOTE — Progress Notes (Signed)
  Subjective:    Patient ID: Victoria George, female    DOB: 1975-09-21, 36 y.o.   MRN: [id number]  HPI Victoria George is a 36 year old female with a past medical history of hypertension and dyspepsia who seen in followup. She was last seen in December 2012 at which point she improved on Protonix. She was known to me from a hospitalization where she underwent endoscopy which revealed mild gastritis without H. pylori. 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  She has noticed some mild dyspepsia and heavy feeling in her stomach after eating. This has been associated with nausea but not vomiting. She's been out of Protonix for about 2 months.   She is having mild heartburn but no dysphagia or odynophagia. She's also developed constipation but no blood in her stool or melena. She tried MiraLAX over-the-counter for about 10 days without much benefit. Appetite has been good and no weight loss.  Review of Systems As per history of present illness, otherwise negative  Current Medications, Allergies, Past Medical History, Past Surgical History, Family History and Social History were reviewed in [facility] record.     Objective:   Physical Exam BP 110/80  Pulse 88  Ht 5\' 1"  (1.549 m)  Wt 201 lb (91.173 kg)  BMI 37.98 kg/m2  SpO2 100% Constitutional: Well-developed and well-nourished. No distress. HEENT: Normocephalic and atraumatic. Oropharynx is clear and moist. No oropharyngeal exudate. Conjunctivae are normal.  No scleral icterus. Cardiovascular: Normal rate, regular rhythm and intact distal pulses. No M/R/G Pulmonary/chest: Effort normal and breath sounds normal. No wheezing, rales or rhonchi. Abdominal: Soft, obese, mild tenderness without rebound or guarding, nondistended. Bowel sounds active throughout. There are no masses palpable. Extremities: no clubbing,  cyanosis, or edema Neurological: Alert and oriented to person place and time. Skin: Skin is warm and dry. No rashes noted. Psychiatric: Normal mood and affect. Behavior is normal.    Assessment & Plan:  36 year old female with a past medical history of hypertension and dyspepsia who seen in followup.  1.  Dyspepsia/GERD -- her symptoms have returned off her Protonix and I have recommended we resume pantoprazole at 40 mg daily. She is instructed to take this 30 minutes to one hour before breakfast. I will give her a refill on her Zofran to be used as needed and as directed. I also recommended Align one capsule daily as a probiotic to help regulate her digestion and bowel habits. She is asked to call us if her symptoms do not improve fully and she voices understanding  2.  Constipation -- no current alarm symptoms and I recommended LINZESS 145 mcg daily. If she develops diarrhea she will let us know, and if she is not completely satisfied with this medication she will let us know.  3.  Hypertension -- she is nearly out of her hydrochlorothiazide and I will provide her with one refill and she will contact her PCP for this medication chronically.  Return in one year or sooner if not completely better

## 2012-04-11 ENCOUNTER — Encounter: Payer: Self-pay | Admitting: Internal Medicine

## 2012-04-12 ENCOUNTER — Encounter: Payer: Self-pay | Admitting: Internal Medicine

## 2012-04-12 ENCOUNTER — Ambulatory Visit (INDEPENDENT_AMBULATORY_CARE_PROVIDER_SITE_OTHER): Payer: PRIVATE HEALTH INSURANCE | Admitting: Internal Medicine

## 2012-04-12 ENCOUNTER — Other Ambulatory Visit (INDEPENDENT_AMBULATORY_CARE_PROVIDER_SITE_OTHER): Payer: PRIVATE HEALTH INSURANCE

## 2012-04-12 VITALS — BP 122/76 | HR 100 | Ht 61.0 in | Wt 191.0 lb

## 2012-04-12 DIAGNOSIS — K3189 Other diseases of stomach and duodenum: Secondary | ICD-10-CM

## 2012-04-12 DIAGNOSIS — R1013 Epigastric pain: Secondary | ICD-10-CM

## 2012-04-12 DIAGNOSIS — K5900 Constipation, unspecified: Secondary | ICD-10-CM

## 2012-04-12 DIAGNOSIS — K5909 Other constipation: Secondary | ICD-10-CM

## 2012-04-12 MED ORDER — LINACLOTIDE 290 MCG PO CAPS: 290.0000 ug | ORAL_CAPSULE | Freq: Every day | ORAL | Status: DC

## 2012-04-12 NOTE — Patient Instructions (Addendum)
Your physician has requested that you go to the basement for the following lab work before leaving today: TSH  We have sent the following medications to your pharmacy for you to pick up at your convenience: Linzess 290 mcg  Follow up with Dr. Rhea Belton as needed

## 2012-04-12 NOTE — Progress Notes (Signed)
  Subjective:    Patient ID: Victoria George, female    DOB: [birth year], 37 y.o.   MRN: [id number]  HPI Ms. Windle Guard is a 37 year old female with a past medical history of hypertension, status post cholecystectomy, constipation and dyspepsia who seen in followup. She was last seen in October 2013 at which point she was started on LINZESS 145 mcg daily for constipation.  She reports she uses medication with success initially, but it seemed to lose its efficacy. She ran out of the medication and went back to taking MiraLAX 17 g daily and docusate 100 mg twice a day. She's had absolutely no favorable response to MiraLAX and docusate. She has not had a bowel movement in the last 7 days. She's had associated bloating and feeling "full". No nausea or vomiting. Her heartburn and dyspeptic symptoms are well-controlled on pantoprazole 40 mg daily. She's had no rectal bleeding. No melena. Her stools are described as "hard balls", though she states occasionally they are loose and yellow "when the MiraLAX works".  No fevers or chills  Review of Systems As per history of present illness, otherwise negative  Current Medications, Allergies, Past Medical History, Past Surgical History, Family History and Social History were reviewed in [employer] [facility] record.     Objective:   Physical Exam BP 122/76  Pulse 100  Ht 5\' 1"  (1.549 m)  Wt 191 lb (86.637 kg)  BMI 36.09 kg/m2  LMP 03/20/2012 Constitutional: Well-developed and well-nourished. No distress. HEENT: Normocephalic and atraumatic. Conjunctivae are normal.  No scleral icterus. Abdominal: Soft, nontender, nondistended. Bowel sounds active throughout. There are no masses palpable. No hepatosplenomegaly. Extremities: no clubbing, cyanosis, or edema Neurological: Alert and oriented to person place and time. Skin: Skin is warm and dry. No rashes noted. Psychiatric: Normal mood and affect. Behavior is normal.  TSH - ordered today      Assessment & Plan:  37 year old female with a past medical history of hypertension, status post cholecystectomy, constipation and dyspepsia who seen in followup  1.  Constipation -- initially LINZESS 145 mcg to help her and she reported complete and spontaneous bowel movements. It seemed to lose its efficacy, and she is currently getting no benefit from MiraLAX and Colace. I suggested we try the higher dose of LINZESS at 290 mcg daily. She is directed to take this 30 minutes before breakfast. She is reminded of the main side effects which is diarrhea. His diarrhea is an issue she is asked to call our office. She voices understanding. I will also check a TSH today, as per the record it has not been checked recently, to ensure that hypothyroidism is not contributing to her constipation. I've also advised that she try to increase her daily fiber intake as well as fluid intake. If she fails to get benefit from [facility] to 90 or it loses its efficacy, she is asked to let me know. She voices understanding.  2.  Dyspepsia -- well-controlled on pantoprazole 40 mg daily. She will continue this for now.  Followup as needed

## 2012-04-15 ENCOUNTER — Telehealth: Payer: Self-pay | Admitting: Internal Medicine

## 2012-04-15 NOTE — Telephone Encounter (Signed)
Pt saw Dr Rhea Belton on 04/12/12 for constipation; she had been taking Linzess daily, but it has lost it's effectiveness. Dr Rhea Belton increased the Linzess to daily. She was also to increase her fiber. This am pt reports last night she developed nausea and had heart palpitations, her legs were weak also. She vomited and had stomach cramps; cramps above the waist. Pt informed she probably has a virus that will have to run it's course. Instructed pt to drink plenty of liquids to keep herself hydrated and if she can't keep hydrated or her vomiting is uncontrolled, she needs to go to the ER or an UC facility. Pt stated understanding.

## 2012-04-18 ENCOUNTER — Telehealth: Payer: Self-pay | Admitting: Internal Medicine

## 2012-04-18 NOTE — Telephone Encounter (Signed)
lvm for pt to call me back regarding medication

## 2012-04-19 ENCOUNTER — Telehealth: Payer: Self-pay | Admitting: Gastroenterology

## 2012-04-19 ENCOUNTER — Emergency Department (HOSPITAL_COMMUNITY): Payer: PRIVATE HEALTH INSURANCE

## 2012-04-19 ENCOUNTER — Encounter (HOSPITAL_COMMUNITY): Payer: Self-pay | Admitting: *Deleted

## 2012-04-19 ENCOUNTER — Emergency Department (HOSPITAL_COMMUNITY)
Admission: EM | Admit: 2012-04-19 | Discharge: 2012-04-19 | Disposition: A | Discharge status: Home / Self Care | Payer: PRIVATE HEALTH INSURANCE | Attending: Emergency Medicine | Admitting: Emergency Medicine

## 2012-04-19 DIAGNOSIS — I10 Essential (primary) hypertension: Secondary | ICD-10-CM | POA: Insufficient documentation

## 2012-04-19 DIAGNOSIS — K5900 Constipation, unspecified: Secondary | ICD-10-CM

## 2012-04-19 DIAGNOSIS — K2970 Gastritis, unspecified, without bleeding: Secondary | ICD-10-CM | POA: Insufficient documentation

## 2012-04-19 DIAGNOSIS — R112 Nausea with vomiting, unspecified: Secondary | ICD-10-CM | POA: Insufficient documentation

## 2012-04-19 DIAGNOSIS — Z79899 Other long term (current) drug therapy: Secondary | ICD-10-CM | POA: Insufficient documentation

## 2012-04-19 DIAGNOSIS — E876 Hypokalemia: Secondary | ICD-10-CM | POA: Insufficient documentation

## 2012-04-19 DIAGNOSIS — Z9089 Acquired absence of other organs: Secondary | ICD-10-CM | POA: Insufficient documentation

## 2012-04-19 DIAGNOSIS — R1110 Vomiting, unspecified: Secondary | ICD-10-CM

## 2012-04-19 DIAGNOSIS — K2990 Gastroduodenitis, unspecified, without bleeding: Secondary | ICD-10-CM | POA: Insufficient documentation

## 2012-04-19 LAB — URINALYSIS, ROUTINE W REFLEX MICROSCOPIC
Glucose, UA: NEGATIVE mg/dL
Ketones, ur: NEGATIVE mg/dL
Nitrite: NEGATIVE
Protein, ur: NEGATIVE mg/dL
Urobilinogen, UA: 1 mg/dL (ref 0.0–1.0)

## 2012-04-19 LAB — CBC
HCT: 34.8 % — ABNORMAL LOW (ref 36.0–46.0)
MCH: 25.9 pg — ABNORMAL LOW (ref 26.0–34.0)
MCV: 81.3 fL (ref 78.0–100.0)
RBC: 4.28 MIL/uL (ref 3.87–5.11)
WBC: 3.5 10*3/uL — ABNORMAL LOW (ref 4.0–10.5)

## 2012-04-19 LAB — URINE MICROSCOPIC-ADD ON

## 2012-04-19 LAB — COMPREHENSIVE METABOLIC PANEL
BUN: 9 mg/dL (ref 6–23)
CO2: 24 mEq/L (ref 19–32)
Calcium: 8.9 mg/dL (ref 8.4–10.5)
Chloride: 99 mEq/L (ref 96–112)
Creatinine, Ser: 0.77 mg/dL (ref 0.50–1.10)
GFR calc Af Amer: >90 mL/min (ref >90)
GFR calc non Af Amer: >90 mL/min (ref >90)
Glucose, Bld: 90 mg/dL (ref 70–99)
Total Bilirubin: 0.2 mg/dL — ABNORMAL LOW (ref 0.3–1.2)

## 2012-04-19 LAB — LIPASE, BLOOD: Lipase: 20 U/L (ref 11–59)

## 2012-04-19 MED ORDER — POTASSIUM CHLORIDE CRYS ER 20 MEQ PO TBCR
40.0000 meq | EXTENDED_RELEASE_TABLET | Freq: Once | ORAL | Status: AC
  Administered 2012-04-19: 40 meq via ORAL
  Filled 2012-04-19: qty 2

## 2012-04-19 MED ORDER — HYDROMORPHONE HCL PF 1 MG/ML IJ SOLN
1.0000 mg | Freq: Once | INTRAMUSCULAR | Status: AC
  Administered 2012-04-19: 1 mg via INTRAVENOUS
  Filled 2012-04-19: qty 1

## 2012-04-19 MED ORDER — ONDANSETRON HCL 4 MG/2ML IJ SOLN
4.0000 mg | Freq: Once | INTRAMUSCULAR | Status: AC
  Administered 2012-04-19: 4 mg via INTRAVENOUS
  Filled 2012-04-19: qty 2

## 2012-04-19 MED ORDER — SODIUM CHLORIDE 0.9 % IV BOLUS (SEPSIS)
1000.0000 mL | Freq: Once | INTRAVENOUS | Status: AC
  Administered 2012-04-19: 1000 mL via INTRAVENOUS

## 2012-04-19 MED ORDER — GI COCKTAIL ~~LOC~~
30.0000 mL | Freq: Once | ORAL | Status: AC
  Administered 2012-04-19: 30 mL via ORAL
  Filled 2012-04-19: qty 30

## 2012-04-19 MED ORDER — ONDANSETRON HCL 4 MG PO TABS: 4.0000 mg | ORAL_TABLET | Freq: Four times a day (QID) | ORAL | Status: DC

## 2012-04-19 MED ORDER — OXYCODONE-ACETAMINOPHEN 5-325 MG PO TABS
1.0000 | ORAL_TABLET | Freq: Once | ORAL | Status: AC
  Administered 2012-04-19: 2 via ORAL
  Filled 2012-04-19: qty 2

## 2012-04-19 MED ORDER — OXYCODONE-ACETAMINOPHEN 5-325 MG PO TABS: 1.0000 | ORAL_TABLET | Freq: Four times a day (QID) | ORAL | Status: DC | PRN

## 2012-04-19 MED ORDER — LUBIPROSTONE 24 MCG PO CAPS: 24.0000 ug | ORAL_CAPSULE | Freq: Two times a day (BID) | ORAL | Status: DC

## 2012-04-19 MED ORDER — POTASSIUM CHLORIDE 10 MEQ/100ML IV SOLN
10.0000 meq | Freq: Once | INTRAVENOUS | Status: AC
  Administered 2012-04-19: 10 meq via INTRAVENOUS
  Filled 2012-04-19: qty 100

## 2012-04-19 MED ORDER — MAGNESIUM SULFATE 40 MG/ML IJ SOLN
2.0000 g | Freq: Once | INTRAMUSCULAR | Status: AC
  Administered 2012-04-19: 2 g via INTRAVENOUS
  Filled 2012-04-19: qty 50

## 2012-04-19 NOTE — ED Notes (Signed)
Pt reports epigastric pain with n/v since this am. Emesis x2. Constipation, last BM Sunday.

## 2012-04-19 NOTE — ED Provider Notes (Signed)
History     CSN: [id number]  Arrival date & time 04/19/12  1303   First MD Initiated Contact with Patient 04/19/12 1320      Chief Complaint  Patient presents with  . Abdominal Pain  . Nausea  . Emesis    (Consider location/radiation/quality/duration/timing/severity/associated sxs/prior treatment) HPI Pt presents with epigastric pain and vomiting.  Pt states and per chart review she has hx of similar symptoms in the past. Pt states pain began this morning.  And then she had emesis x 2.  She sees gastroenterology for dyspepsia and constipation.  She called GI office and they called in Amt[place] for her but she has not yet picked this up.  Last BM several days ago.  No fever/chills.  No dysuria, no vaginal discharge or bleeding.  Pain is sharp and burning in nature.  Emesis is nonbloody and nonbilious.  There are no other associated systemic symptoms, there are no other alleviating or modifying factors.   Past Medical History  Diagnosis Date  . Hypertension     Past Surgical History  Procedure Date  . Esophagogastroduodenoscopy 01/30/2011    Procedure: ESOPHAGOGASTRODUODENOSCOPY (EGD);  Surgeon: Erick Blinks, MD;  Location: [facility];  Service: Gastroenterology;  Laterality: N/A;  . Diagnostic laparoscopy   . Cholecystectomy 02/01/2011    Procedure: LAPAROSCOPIC CHOLECYSTECTOMY WITH INTRAOPERATIVE CHOLANGIOGRAM;  Surgeon: Adolph Pollack, MD;  Location: [facility];  Service: General;  Laterality: N/A;  Needs IOC and C-arm  . Esophagogastroduodenoscopy 03/03/2011    Procedure: ESOPHAGOGASTRODUODENOSCOPY (EGD);  Surgeon: Erick Blinks, MD;  Location: [facility];  Service: Gastroenterology;  Laterality: N/A;    Family History  Problem Relation Age of Onset  . Hypertension Mother   . Anesthesia problems Neg Hx   . Malignant hyperthermia Neg Hx   . Pseudochol deficiency Neg Hx   . Colon cancer Maternal Grandmother   . Diabetes Brother   . Diabetes Maternal Grandmother   . Diabetes  Paternal Grandmother   . Diverticulosis Paternal Grandmother   . Kidney disease Maternal Grandmother     History  Substance Use Topics  . Smoking status: Never Smoker   . Smokeless tobacco: Never Used  . Alcohol Use: Yes     Comment: occasionally    OB History    Grav Para Term Preterm Abortions TAB SAB Ect Mult Living   0               Review of Systems ROS reviewed and all otherwise negative except for mentioned in HPI  Allergies  Shellfish allergy; Hydrocodone; and Promethazine hcl  Home Medications   Current Outpatient Rx  Name  Route  Sig  Dispense  Refill  . ALIGN PO CAPS   Oral   Take 1 capsule by mouth every morning.         Marland Kitchen FIBER SELECT GUMMIES PO   Oral   Take 1 each by mouth daily.         Marland Kitchen HYDROCHLOROTHIAZIDE 25 MG PO TABS   Oral   Take 25 mg by mouth every morning.         Marland Kitchen LINACLOTIDE 290 MCG PO CAPS   Oral   Take 290 mcg by mouth every morning.         . ADULT MULTIVITAMIN W/MINERALS CH   Oral   Take 1 tablet by mouth daily.         Marland Kitchen ONDANSETRON HCL 4 MG PO TABS   Oral   Take 1 tablet (4  mg total) by mouth every 8 (eight) hours as needed. For nausea   30 tablet   1   . PANTOPRAZOLE SODIUM 40 MG PO TBEC   Oral   Take 40 mg by mouth every morning.         Marland Kitchen PHENTERMINE HCL 37.5 MG PO TABS   Oral   Take 37.5 mg by mouth daily before breakfast.         . ONDANSETRON HCL 4 MG PO TABS   Oral   Take 1 tablet (4 mg total) by mouth every 6 (six) hours.   12 tablet   0   . OXYCODONE-ACETAMINOPHEN 5-325 MG PO TABS   Oral   Take 1-2 tablets by mouth every 6 (six) hours as needed for pain.   15 tablet   0     BP 133/89  Pulse 73  Temp 98.1 F (36.7 C) (Oral)  Resp 16  SpO2 96%  LMP 03/17/2012 Vitals reviewed Physical Exam Physical Examination: General appearance - alert, well appearing, and in no distress Mental status - alert, oriented to person, place, and time Eyes - no conjunctival injection, no scleral  icterus Mouth - mucous membranes moist, pharynx normal without lesions Chest - clear to auscultation, no wheezes, rales or rhonchi, symmetric air entry Heart - normal rate, regular rhythm, normal S1, S2, no murmurs, rubs, clicks or gallops Abdomen - soft, epigastric tenderness to palpation, no gaurding or rebound tenderness, nondistended, no masses or organomegaly, nabs Back exam - full range of motion, no tenderness, palpable spasm or pain on motion Extremities - peripheral pulses normal, no pedal edema, no clubbing or cyanosis Skin - normal coloration and turgor, no rashes  ED Course  Procedures (including critical care time)  Labs Reviewed  CBC - Abnormal; Notable for the following:    WBC 3.5 (*)     Hemoglobin 11.1 (*)     HCT 34.8 (*)     MCH 25.9 (*)     All other components within normal limits  COMPREHENSIVE METABOLIC PANEL - Abnormal; Notable for the following:    Potassium 2.8 (*)     Total Bilirubin 0.2 (*)     All other components within normal limits  LIPASE, BLOOD  URINALYSIS, ROUTINE W REFLEX MICROSCOPIC  PREGNANCY, URINE   Dg Abd Acute W/chest  04/19/2012  *RADIOLOGY REPORT*  Clinical Data: 1-day history of right upper quadrant abdominal pain and nausea/vomiting.  6-day history of constipation.  ACUTE ABDOMEN SERIES (ABDOMEN 2 VIEW & CHEST 1 VIEW)  Comparison: Acute abdomen series 03/02/2011.  CT abdomen and pelvis 02/24/2011, 01/27/2011.  Two-view chest x-ray 01/26/2011.  Findings: Bowel gas pattern unremarkable without evidence of obstruction or significant ileus.  Air-fluid levels in the colon consistent with liquid stool, with a moderate stool burden throughout the colon.  No free intraperitoneal air.  Surgical clips in the right upper quadrant from prior cholecystectomy.  Surgical clip to the left of midline in the upper pelvis.  No visible opaque urinary tract calculi.  Regional skeleton unremarkable.  Cardiomediastinal silhouette unremarkable, unchanged.  Lungs  clear. Bronchovascular markings normal.  Pulmonary vascularity normal.  No pneumothorax.  No pleural effusions.  IMPRESSION:  1.  No acute abdominal abnormality.  Liquid stool in the colon with a moderate stool burden. 2.  No acute cardiopulmonary disease.   Original Report Authenticated By: Hulan Saas, M.D.      1. Gastritis   2. Vomiting       MDM  Pt presenting  with c/o epigastric pain and vomiting.  Also constipation.  Labs reassuring. Pt is s/p cholecystectomy.  Has hx of similar pain in the past.  Acute abd series shows moderate stool burden, no sign of obstruction.  Pt is found to be hypokalemic, this is somewhat chronic in nature.  This was repleted with IV/Po potassium in ED.  Awaiting urinalysis, then patient will be stable for discharge to f/u with GI and her PMD.          Ethelda Chick, MD 04/19/12 [phone]

## 2012-04-19 NOTE — ED Notes (Signed)
RN to obtain labs with start of IV 

## 2012-04-19 NOTE — Telephone Encounter (Signed)
Spoke to pt. Told her Dr. Rhea Belton will send in a Rx for Amitza, take 1 capsule twice daily. Pt stated understanding.

## 2012-11-06 ENCOUNTER — Encounter: Payer: Self-pay | Admitting: Obstetrics

## 2012-11-06 ENCOUNTER — Ambulatory Visit (INDEPENDENT_AMBULATORY_CARE_PROVIDER_SITE_OTHER): Payer: PRIVATE HEALTH INSURANCE | Admitting: Obstetrics

## 2012-11-06 VITALS — BP 143/95 | HR 94 | Temp 98.3°F | Ht 61.0 in | Wt 197.0 lb

## 2012-11-06 DIAGNOSIS — Z01419 Encounter for gynecological examination (general) (routine) without abnormal findings: Secondary | ICD-10-CM

## 2012-11-06 NOTE — Progress Notes (Signed)
Subjective:     Victoria George is a 37 y.o. female here for a routine exam.  Current complaints: annual exam.  Pt states her last cycle was 4 days late. Pt states when the cycle came it was a normal. Personal health questionnaire reviewed: yes.   Gynecologic History Patient's last menstrual period was 10/31/2012. Contraception: none Last Pap: 2013. Results were: normal Last mammogram: n/a. Results were: n/a  Obstetric History OB History  Gravida Para Term Preterm AB SAB TAB Ectopic Multiple Living  0                  The following portions of the patient's history were reviewed and updated as appropriate: allergies, current medications, past family history, past medical history, past social history, past surgical history and problem list.  Review of Systems Pertinent items are noted in HPI.    Objective:    General appearance: alert and no distress Breasts: normal appearance, no masses or tenderness Abdomen: normal findings: soft, non-tender Pelvic: cervix normal in appearance, external genitalia normal, no adnexal masses or tenderness, no cervical motion tenderness, uterus normal size, shape, and consistency and vagina normal without discharge Extremities: extremities normal, atraumatic, no cyanosis or edema    Assessment:    Healthy female exam.   Desires pregnancy  H/O uterine fibroids   Plan:    Education reviewed: low fat, low cholesterol diet and self breast exams.    Referred to Fermin Schwab, MD  Reproductive Endocrinology

## 2012-11-07 LAB — WET PREP BY MOLECULAR PROBE
Candida species: NEGATIVE
Trichomonas vaginosis: NEGATIVE

## 2012-11-07 LAB — PAP IG W/ RFLX HPV ASCU

## 2013-01-21 ENCOUNTER — Encounter: Payer: Self-pay | Admitting: Obstetrics

## 2013-01-22 IMAGING — US ABDOMEN ULTRASOUND LIMITED
1 series · 17 of 25 positions shown · non-contrast
Comparison: none

REASON FOR EXAM: ruq pain post cholecystectomy
COMMENTS:   Body Site: GB and Fossa, CBD, Head of Pancreas

PROCEDURE:     US  - US ABDOMEN LIMITED SURVEY  - February 23, 2011  [DATE]
RESULT:     Comparison: None.
TECHNIQUE: Multiple grayscale and color Doppler images were obtained of the
right upper quadrant.

[Series 1: abdomen ultrasound limited · 17 of 52 slices shown]
[im 1/52]
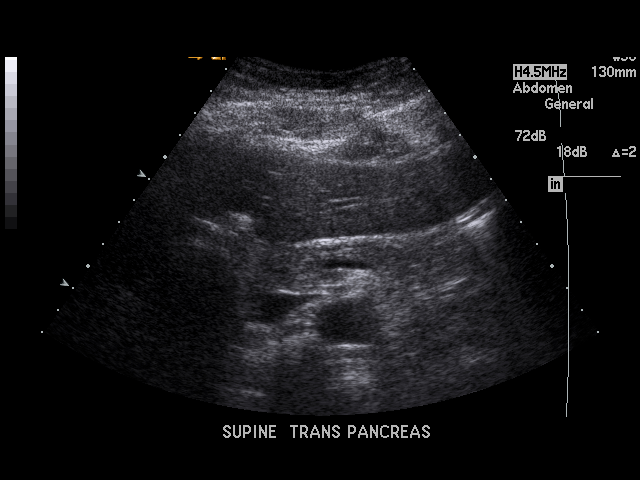
[im 5/52]
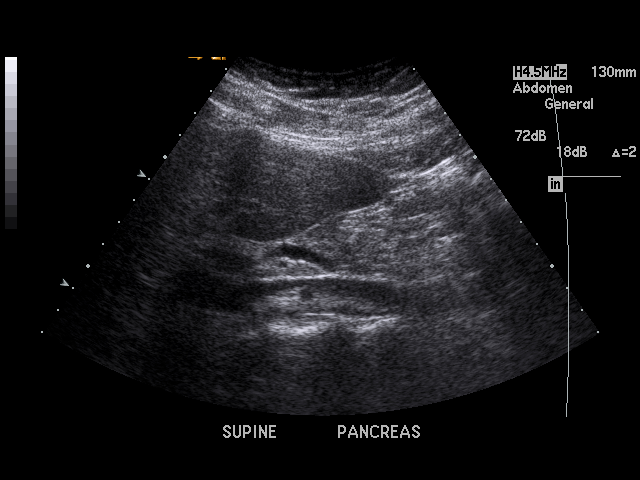
[im 7/52]
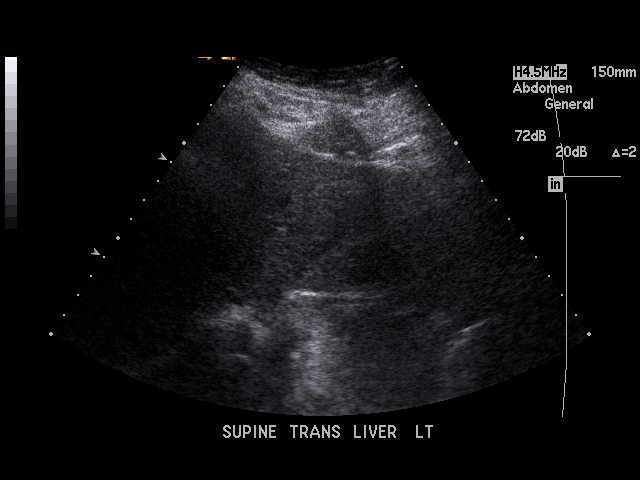
[im 11/52]
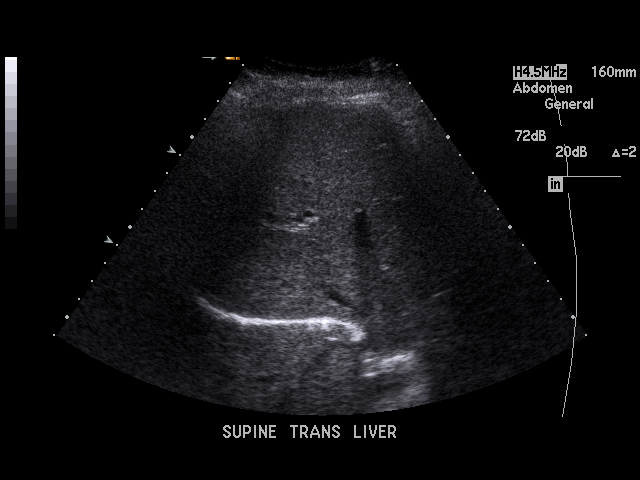
[im 13/52]
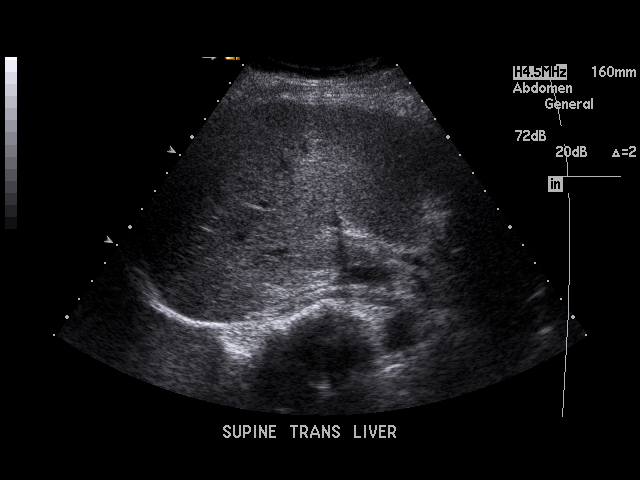
[im 18/52]
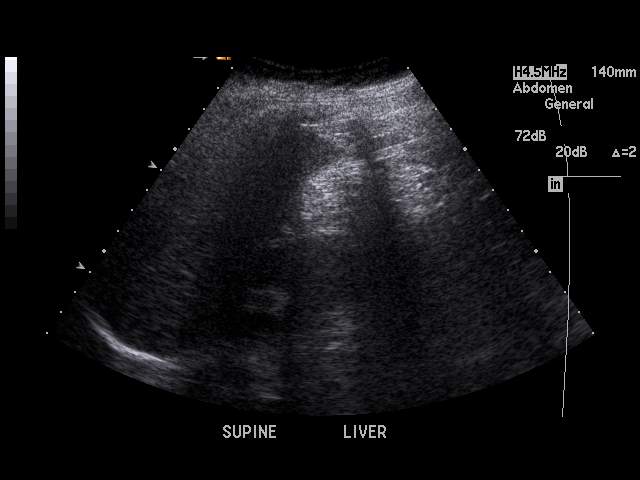
[im 20/52]
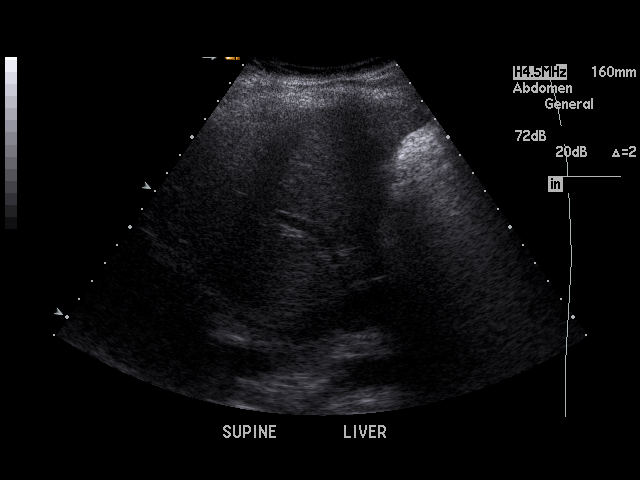
[im 24/52]
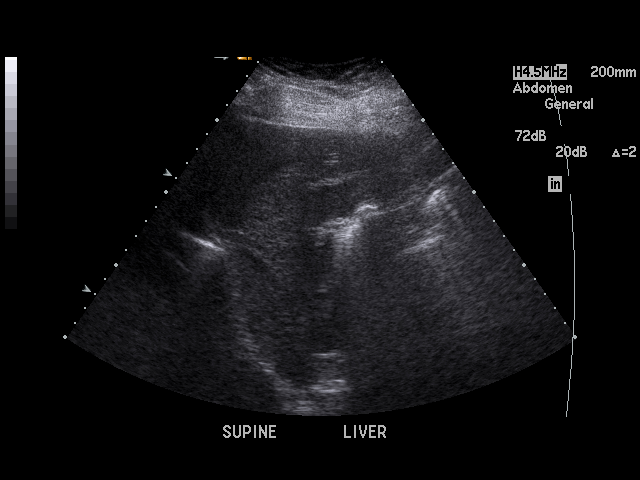
[im 26/52]
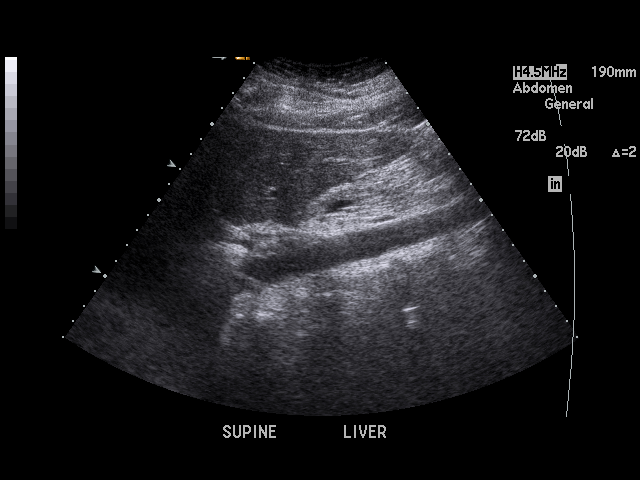
[im 28/52]
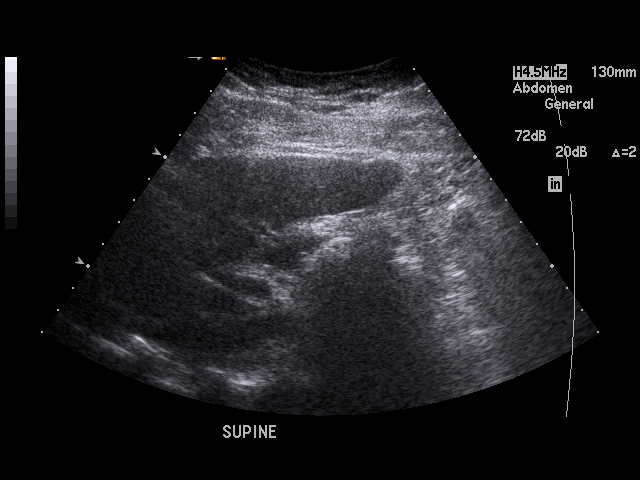
[im 32/52]
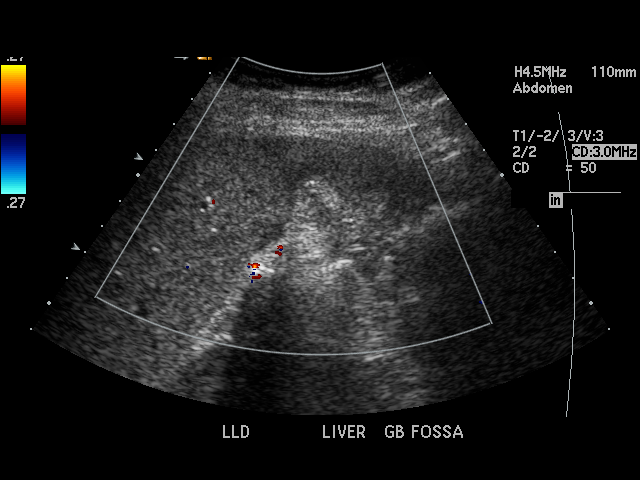
[im 35/52]
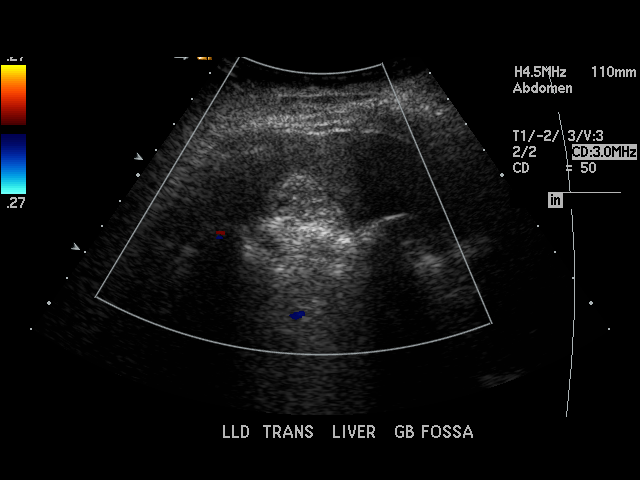
[im 39/52]
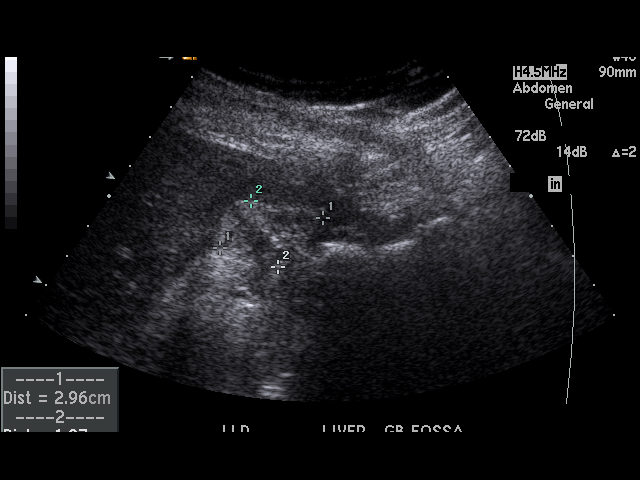
[im 41/52]
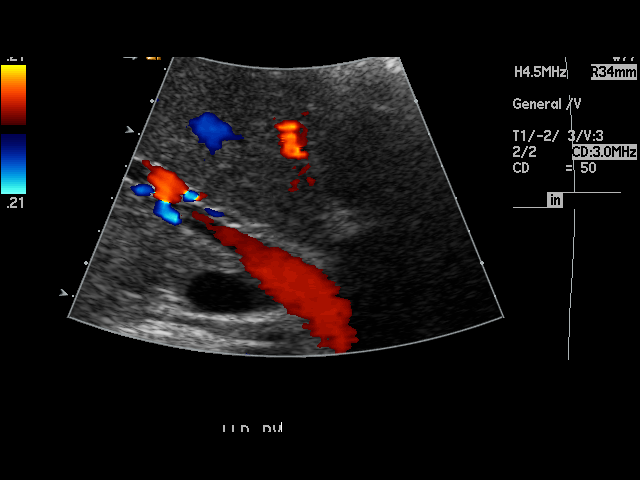
[im 45/52]
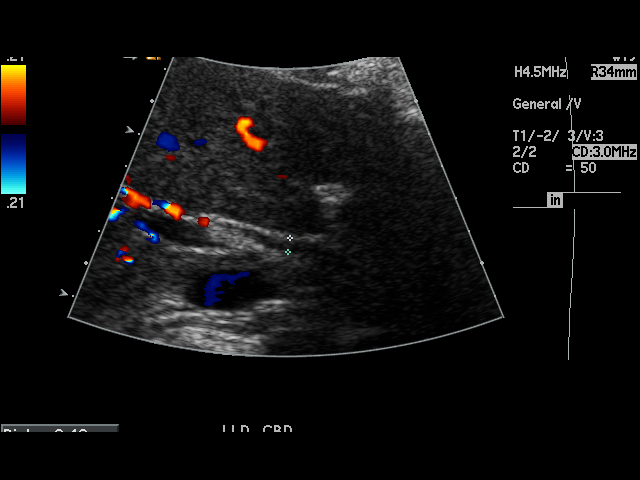
[im 47/52]
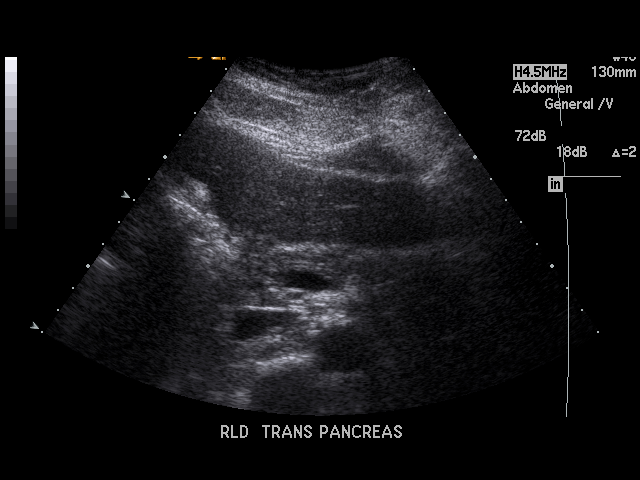
[im 52/52]
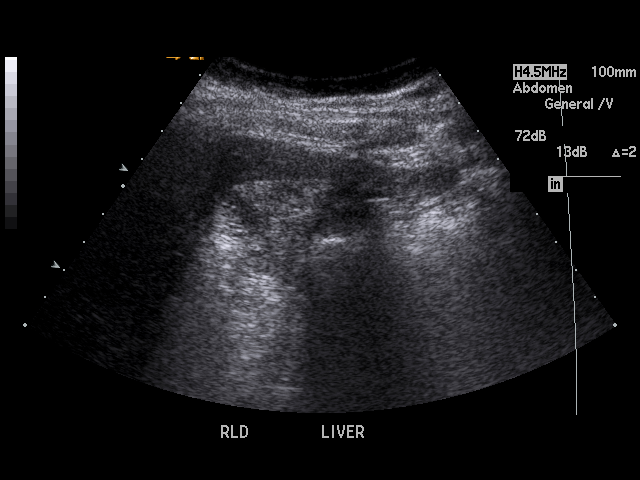

[17 of 25 positions shown; findings below may reference images not displayed]

FINDINGS: There is a small heterogeneous hypoechoic collection in the gallbladder
fossa. It measures 3.0 x 1.9 x 2.0 cm. The visualized liver is unremarkable.
The common bile duct measures 4 mm.
IMPRESSION: Small heterogeneous collection in the gallbladder fossa, which is
nonspecific. This may represent postoperative changes from the recent prior
cholecystectomy. A biloma or abscess cannot be excluded ultrasound.

## 2013-01-23 IMAGING — CT CT ABD-PELV W/ CM
2 of 4 series · 17 of 46 positions shown, 19 images · IV contrast (APPLIED)
Comparison: 01/27/2011

CLINICAL DATA: Abdominal pain.  Cholecystectomy approximately 3
weeks ago.  Right upper quadrant pain.

CT ABDOMEN AND PELVIS WITH CONTRAST
TECHNIQUE: Multidetector CT imaging of the abdomen and pelvis was
performed following the standard protocol during bolus
administration of intravenous contrast.
Contrast: 100mL OMNIPAQUE IOHEXOL 300 MG/ML IV SOLN

[Series 2: abd/pelv with 5.0 b31f st · axial · 0.77mm/px · z∈[+509,+899]mm · 14 of 86 slices shown, 16 images]
[im 4/86  soft-tissue]
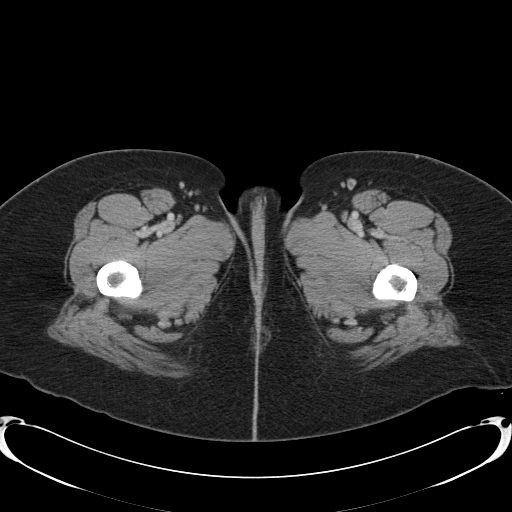
[im 4/86  bone]
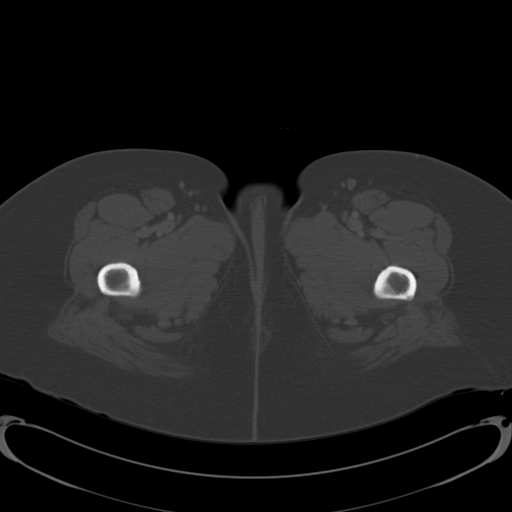
[im 10/86  soft-tissue]
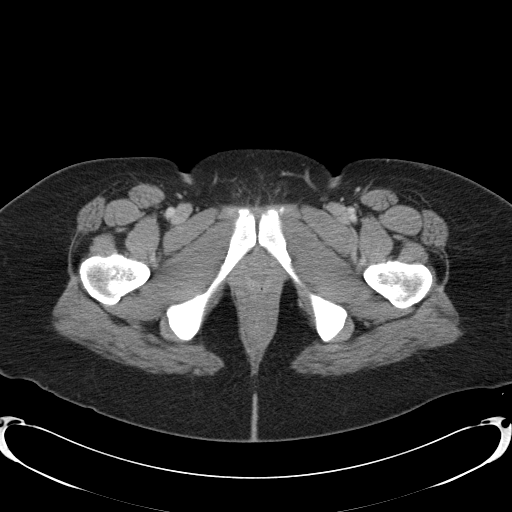
[im 17/86  soft-tissue]
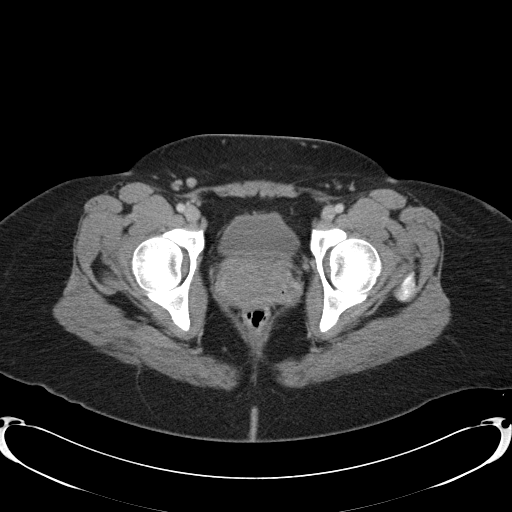
[im 23/86  soft-tissue]
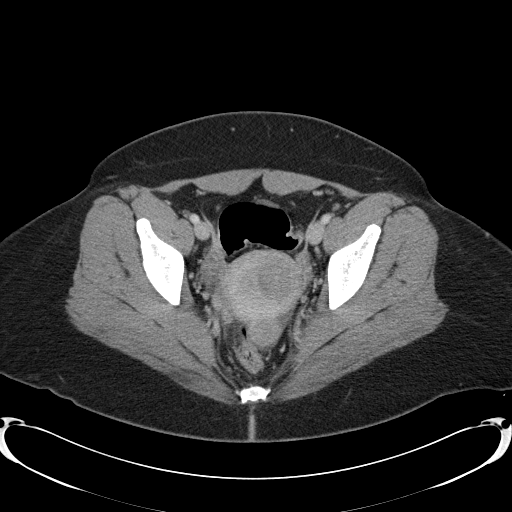
[im 30/86  soft-tissue]
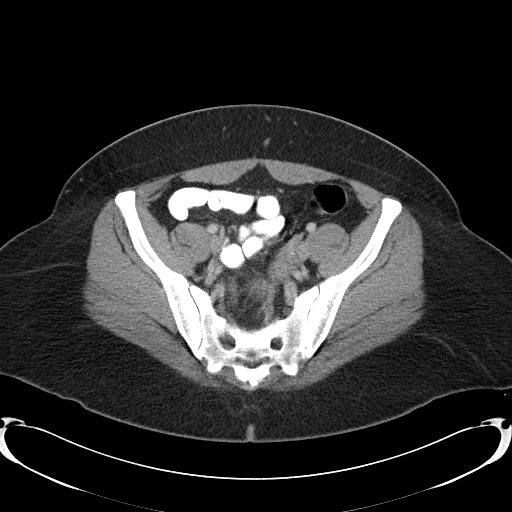
[im 33/86  soft-tissue]
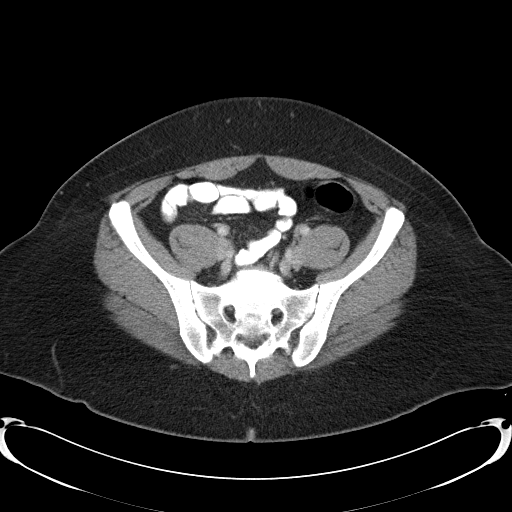
[im 40/86  soft-tissue]
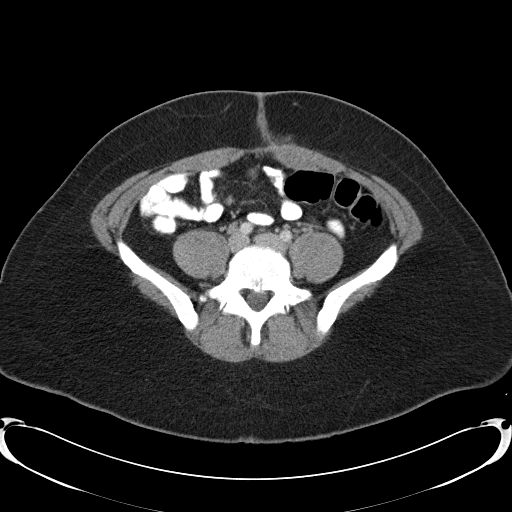
[im 46/86  soft-tissue]
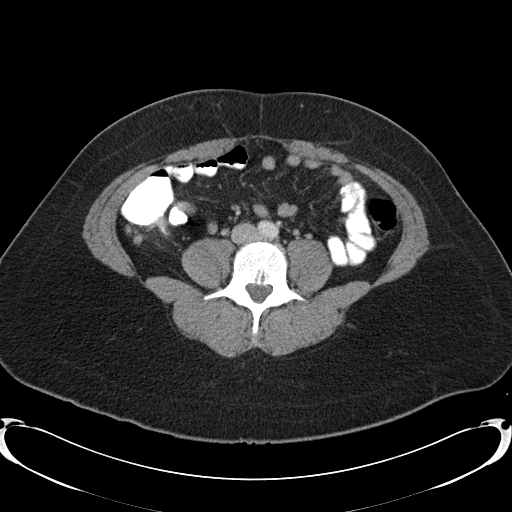
[im 53/86  soft-tissue]
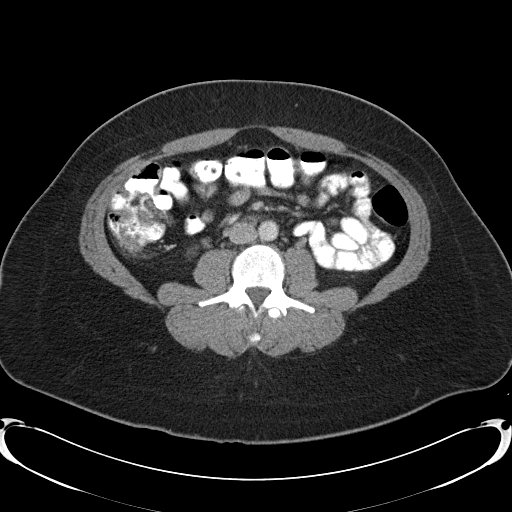
[im 53/86  bone]
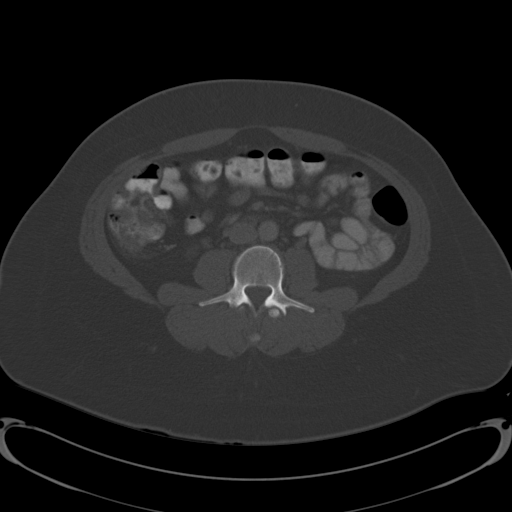
[im 56/86  soft-tissue]
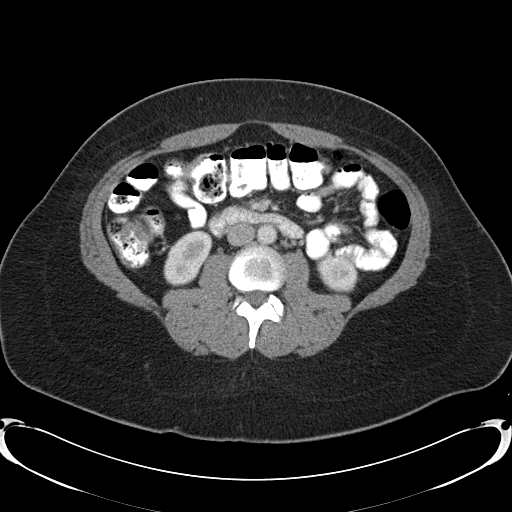
[im 63/86  soft-tissue]
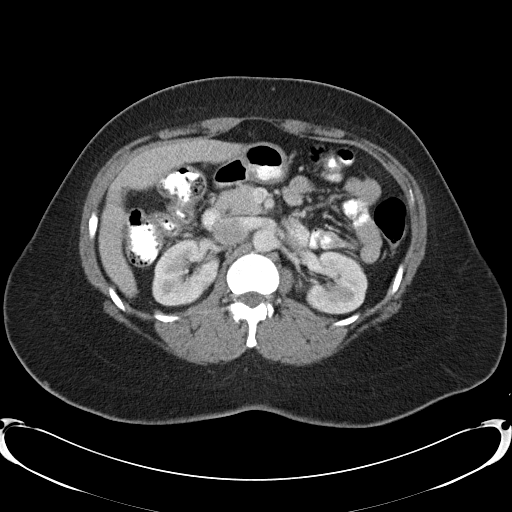
[im 69/86  soft-tissue]
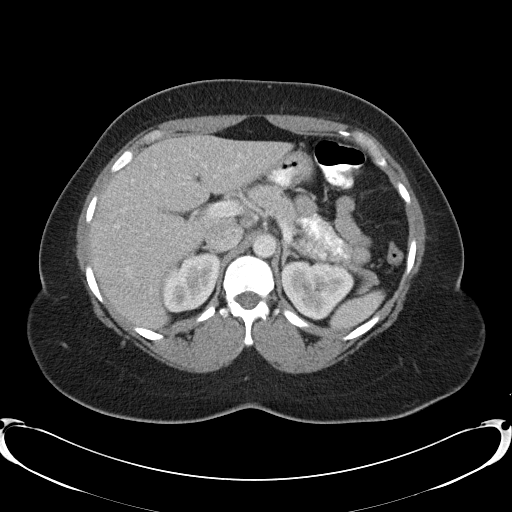
[im 76/86  soft-tissue]
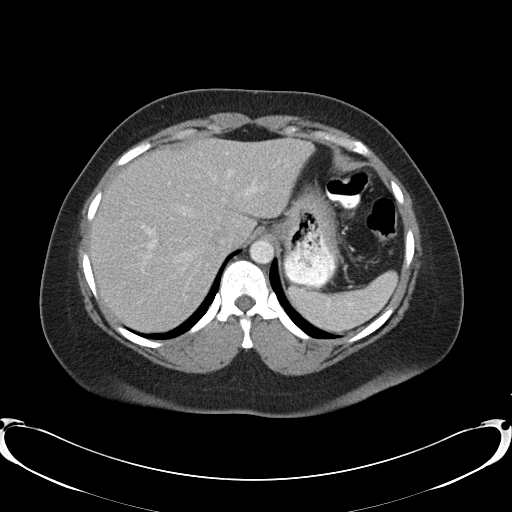
[im 82/86  soft-tissue]
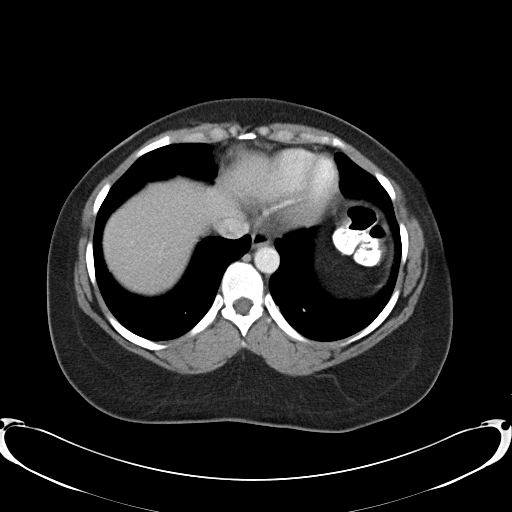

[Series 602: coronal · coronal · 0.84mm/px · 3 of 127 slices shown]
[im 43/127  soft-tissue]
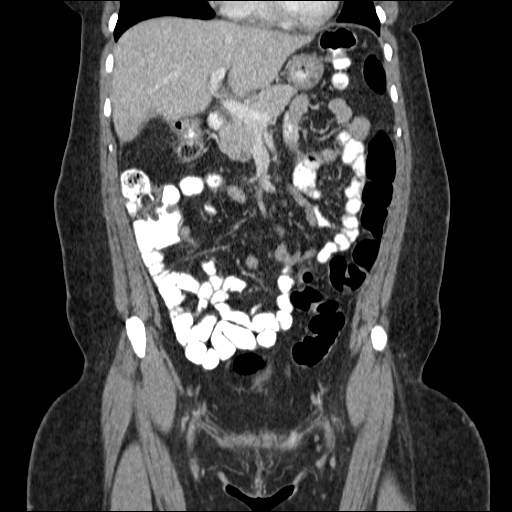
[im 57/127  soft-tissue]
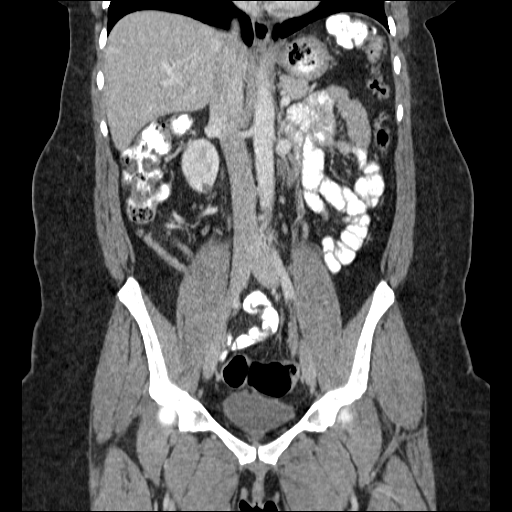
[im 71/127  soft-tissue]
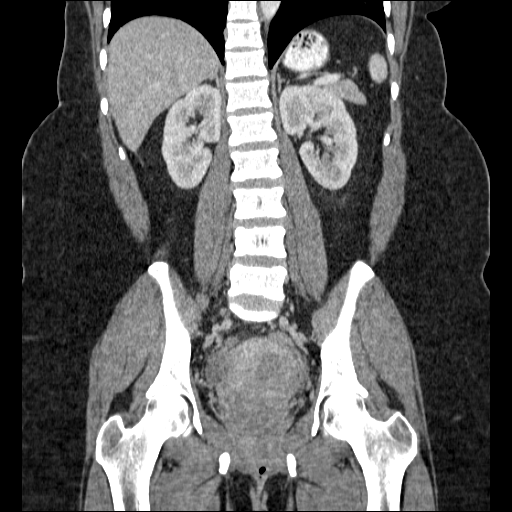

[17 of 46 positions shown; findings below may reference images not displayed]

FINDINGS: Lung bases are clear.  No effusions.  Heart is normal
size.

Liver, spleen, pancreas, adrenals, kidneys are unremarkable.  Tiny
cyst off the lower pole of the right kidney.  No hydronephrosis.

Changes of cholecystectomy.  No fluid collections in the
gallbladder fossa or adjacent to the liver.  Aorta is normal
caliber.  Large and small bowel are unremarkable.  Appendix is
visualized and is normal.

Large hypodense area within the left side of the uterus is stable
compatible with fibroid.  This measures 3.7 cm.  Small amount of
free fluid in the pelvis.  There is an exophytic fibroid off the
posterior aspect of the uterus measuring 2.7 cm.  Adnexa grossly
unremarkable.  Urinary bladder is decompressed and unremarkable.

No acute bony abnormality.
IMPRESSION: Prior cholecystectomy.

Uterine fibroids.

No acute findings.

## 2013-04-16 ENCOUNTER — Encounter (HOSPITAL_COMMUNITY): Payer: Self-pay | Admitting: Emergency Medicine

## 2013-04-16 ENCOUNTER — Emergency Department (INDEPENDENT_AMBULATORY_CARE_PROVIDER_SITE_OTHER)
Admission: EM | Admit: 2013-04-16 | Discharge: 2013-04-16 | Disposition: A | Discharge status: Home / Self Care | Payer: PRIVATE HEALTH INSURANCE | Source: Home / Self Care | Attending: Family Medicine | Admitting: Family Medicine

## 2013-04-16 DIAGNOSIS — J40 Bronchitis, not specified as acute or chronic: Secondary | ICD-10-CM

## 2013-04-16 MED ORDER — IPRATROPIUM-ALBUTEROL 0.5-2.5 (3) MG/3ML IN SOLN
RESPIRATORY_TRACT | Status: AC
  Filled 2013-04-16: qty 3

## 2013-04-16 MED ORDER — PREDNISONE 50 MG PO TABS: 50.0000 mg | ORAL_TABLET | Freq: Every day | ORAL | Status: DC

## 2013-04-16 MED ORDER — ALBUTEROL SULFATE (2.5 MG/3ML) 0.083% IN NEBU
2.5000 mg | INHALATION_SOLUTION | Freq: Once | RESPIRATORY_TRACT | Status: AC
  Administered 2013-04-16: 2.5 mg via RESPIRATORY_TRACT

## 2013-04-16 MED ORDER — ALBUTEROL SULFATE (2.5 MG/3ML) 0.083% IN NEBU
INHALATION_SOLUTION | RESPIRATORY_TRACT | Status: AC
  Filled 2013-04-16: qty 3

## 2013-04-16 MED ORDER — TRAMADOL HCL 50 MG PO TABS: 50.0000 mg | ORAL_TABLET | Freq: Four times a day (QID) | ORAL | Status: DC | PRN

## 2013-04-16 MED ORDER — IPRATROPIUM-ALBUTEROL 0.5-2.5 (3) MG/3ML IN SOLN
3.0000 mL | Freq: Once | RESPIRATORY_TRACT | Status: AC
  Administered 2013-04-16: 3 mL via RESPIRATORY_TRACT

## 2013-04-16 NOTE — ED Provider Notes (Signed)
Victoria George is a 38 y.o. female who presents to Urgent Care today for one week of cough congestion wheezing chest tightness. Patient also notes nausea mild headaches. She notes some mild sharp chest pain worse with deep breathing and coughing. She has a history of asthma as is her symptoms are consistent with asthma. She's tried using her albuterol which helps temporarily. She's also tried NyQuil Mucinex Benadryl which helped only a little.   Past Medical History  Diagnosis Date  . Hypertension    History  Substance Use Topics  . Smoking status: Never Smoker   . Smokeless tobacco: Never Used  . Alcohol Use: Yes     Comment: occasionally   ROS as above Medications: No current facility-administered medications for this encounter.   Current Outpatient Prescriptions  Medication Sig Dispense Refill  . predniSONE (DELTASONE) 50 MG tablet Take 1 tablet (50 mg total) by mouth daily.  5 tablet  0  . traMADol (ULTRAM) 50 MG tablet Take 1 tablet (50 mg total) by mouth every 6 (six) hours as needed (Pain or cough).  15 tablet  0  . [DISCONTINUED] pantoprazole (PROTONIX) 40 MG tablet Take 40 mg by mouth every morning.      . [DISCONTINUED] promethazine (PHENERGAN) 25 MG tablet Take 1 tablet (25 mg total) by mouth every 6 (six) hours as needed for nausea.  30 tablet  0    Exam:  BP 138/88  Pulse 95  Temp(Src) 98 F (36.7 C) (Oral)  Resp 18  SpO2 100%  LMP 03/22/2013 Gen: Well NAD HEENT: EOMI,  MMM normal-appearing posterior pharynx. Tympanic membranes are normal appearing bilaterally Lungs: Normal work of breathing. Decreased breath sounds bilaterally with prolonged expiratory phase Heart: RRR no MRG Abd: NABS, Soft. NT, ND Exts: Brisk capillary refill, warm and well perfused.   Patient was given albuterol and Atrovent nebulizer treatment, and had significant improvement in symptoms   Assessment and Plan: 38 y.o. female with bronchitis with asthma exacerbation. Plan to treat with  prednisone, and albuterol. Will use tramadol for cough suppression she is allergic to hydrocodone. She has tolerated tramadol in the past.   Discussed warning signs or symptoms. Please see discharge instructions. Patient expresses understanding.    Gregor Hams, MD 04/16/13 [phone]

## 2013-04-16 NOTE — ED Notes (Signed)
Pt c/o cold sxs onset 6 to 7 days Sxs include: chest pain, congestion, nauseas, SOB, wheezing Denies: f/v/d... Taking nyquil, mucinex, inhaler w/no relief She is alert w/no signs of acute distress.

## 2013-04-16 NOTE — ED Notes (Signed)
Pt reports she feels much better after breathing tx.

## 2013-04-16 NOTE — Discharge Instructions (Signed)
Thank you for coming in today. Take prednisone daily for 5 days. Use albuterol as needed. Use tramadol for cough as needed. Call or go to the emergency room if you get worse, have trouble breathing, have chest pains, or palpitations.  Followup with your primary care provider.   Bronchitis Bronchitis is inflammation of the airways that extend from the windpipe into the lungs (bronchi). The inflammation often causes mucus to develop, which leads to a cough. If the inflammation becomes severe, it may cause shortness of breath. CAUSES  Bronchitis may be caused by:   Viral infections.   Bacteria.   Cigarette smoke.   Allergens, pollutants, and other irritants.  SIGNS AND SYMPTOMS  The most common symptom of bronchitis is a frequent cough that produces mucus. Other symptoms include:  Fever.   Body aches.   Chest congestion.   Chills.   Shortness of breath.   Sore throat.  DIAGNOSIS  Bronchitis is usually diagnosed through a medical history and physical exam. Tests, such as chest X-rays, are sometimes done to rule out other conditions.  TREATMENT  You may need to avoid contact with whatever caused the problem (smoking, for example). Medicines are sometimes needed. These may include:  Antibiotics. These may be prescribed if the condition is caused by bacteria.  Cough suppressants. These may be prescribed for relief of cough symptoms.   Inhaled medicines. These may be prescribed to help open your airways and make it easier for you to breathe.   Steroid medicines. These may be prescribed for those with recurrent (chronic) bronchitis. HOME CARE INSTRUCTIONS  Get plenty of rest.   Drink enough fluids to keep your urine clear or pale yellow (unless you have a medical condition that requires fluid restriction). Increasing fluids may help thin your secretions and will prevent dehydration.   Only take over-the-counter or prescription medicines as directed by your  health care provider.  Only take antibiotics as directed. Make sure you finish them even if you start to feel better.  Avoid secondhand smoke, irritating chemicals, and strong fumes. These will make bronchitis worse. If you are a smoker, quit smoking. Consider using nicotine gum or skin patches to help control withdrawal symptoms. Quitting smoking will help your lungs heal faster.   Put a cool-mist humidifier in your bedroom at night to moisten the air. This may help loosen mucus. Change the water in the humidifier daily. You can also run the hot water in your shower and sit in the bathroom with the door closed for 5 10 minutes.   Follow up with your health care provider as directed.   Wash your hands frequently to avoid catching bronchitis again or spreading an infection to others.  SEEK MEDICAL CARE IF: Your symptoms do not improve after 1 week of treatment.  SEEK IMMEDIATE MEDICAL CARE IF:  Your fever increases.  You have chills.   You have chest pain.   You have worsening shortness of breath.   You have bloody sputum.  You faint.  You have lightheadedness.  You have a severe headache.   You vomit repeatedly. MAKE SURE YOU:   Understand these instructions.  Will watch your condition.  Will get help right away if you are not doing well or get worse. Document Released: 03/13/2005 Document Revised: 01/01/2013 Document Reviewed: 11/05/2012 [facility] Patient Information 2014 [place].

## 2013-06-12 ENCOUNTER — Other Ambulatory Visit: Payer: Self-pay | Admitting: *Deleted

## 2013-06-12 DIAGNOSIS — N760 Acute vaginitis: Principal | ICD-10-CM

## 2013-06-12 DIAGNOSIS — B379 Candidiasis, unspecified: Secondary | ICD-10-CM

## 2013-06-12 DIAGNOSIS — B9689 Other specified bacterial agents as the cause of diseases classified elsewhere: Secondary | ICD-10-CM

## 2013-06-12 DIAGNOSIS — T3695XA Adverse effect of unspecified systemic antibiotic, initial encounter: Secondary | ICD-10-CM

## 2013-06-12 MED ORDER — METRONIDAZOLE 500 MG PO TABS: 500.0000 mg | ORAL_TABLET | Freq: Two times a day (BID) | ORAL | Status: DC

## 2013-06-12 MED ORDER — FLUCONAZOLE 150 MG PO TABS: 150.0000 mg | ORAL_TABLET | Freq: Once | ORAL | Status: DC

## 2013-06-26 ENCOUNTER — Ambulatory Visit (INDEPENDENT_AMBULATORY_CARE_PROVIDER_SITE_OTHER): Payer: PRIVATE HEALTH INSURANCE | Admitting: Obstetrics

## 2013-06-26 ENCOUNTER — Encounter: Payer: Self-pay | Admitting: Obstetrics

## 2013-06-26 VITALS — BP 145/97 | HR 99 | Temp 98.7°F | Ht 61.0 in | Wt 191.0 lb

## 2013-06-26 DIAGNOSIS — Z01419 Encounter for gynecological examination (general) (routine) without abnormal findings: Secondary | ICD-10-CM

## 2013-06-26 NOTE — Progress Notes (Signed)
Subjective:     Victoria George is a 38 y.o. female here for a routine exam.  Current complaints: Patient is in the office today for an Annual Exam. Patient states she is taking Nexium but is unsure of the Dosage.    Personal health questionnaire:  Is patient Ashkenazi Jewish, have a family history of breast and/or ovarian cancer: yes Is there a family history of uterine cancer diagnosed at age < 51, gastrointestinal cancer, urinary tract cancer, family member who is a Field seismologist syndrome-associated carrier: yes Is the patient overweight and hypertensive, family history of diabetes, personal history of gestational diabetes or PCOS: yes Is patient over 38, have PCOS,  family history of premature CHD under age 35, diabetes, smoke, have hypertension or peripheral artery disease:  yes  The HPI was reviewed and explored in further detail by the provider. Gynecologic History Patient's last menstrual period was 06/13/2013. Contraception: none Last Pap: not asked. Results were: not asked  Obstetric History OB History  Gravida Para Term Preterm AB SAB TAB Ectopic Multiple Living  0                  The following portions of the patient's history were reviewed and updated as appropriate: allergies, current medications, past family history, past medical history, past social history, past surgical history and problem list.  Review of Systems Pertinent items are noted in HPI.    Objective:    General appearance: alert and no distress Breasts: normal appearance, no masses or tenderness Abdomen: normal findings: soft, non-tender Pelvic: cervix normal in appearance, external genitalia normal, no adnexal masses or tenderness, no cervical motion tenderness, rectovaginal septum normal, uterus normal size, shape, and consistency and vagina normal without discharge    50% of 20 min visit spent on counseling and coordination of care.   Assessment:    Healthy female exam.    Plan:    Follow up in: 1  year.

## 2013-06-30 ENCOUNTER — Encounter: Payer: Self-pay | Admitting: Obstetrics

## 2013-08-14 ENCOUNTER — Telehealth: Payer: Self-pay | Admitting: *Deleted

## 2013-08-14 NOTE — Telephone Encounter (Signed)
Patient called and stated that she just used a feminine wipe and thinks she had a reaction to the wipe. She states she is swelling and wants a RX for Flagyl. Call to patient- patient used the wipe at work and was so irritated that she had to go home and shower. Patient states her vulvar is tender and red and she feels a lot of irritation. Recommend patient use Benadryl, warm soaks and even a thin layer of Hydrocortisone cream externally. Patient agrees to try and call back if no better.

## 2013-08-22 ENCOUNTER — Telehealth: Payer: Self-pay | Admitting: *Deleted

## 2013-08-22 DIAGNOSIS — B379 Candidiasis, unspecified: Secondary | ICD-10-CM

## 2013-08-22 DIAGNOSIS — T3695XA Adverse effect of unspecified systemic antibiotic, initial encounter: Secondary | ICD-10-CM

## 2013-08-22 DIAGNOSIS — N390 Urinary tract infection, site not specified: Secondary | ICD-10-CM

## 2013-08-22 MED ORDER — NITROFURANTOIN MONOHYD MACRO 100 MG PO CAPS: 100.0000 mg | ORAL_CAPSULE | Freq: Two times a day (BID) | ORAL | Status: DC

## 2013-08-22 MED ORDER — FLUCONAZOLE 150 MG PO TABS: 150.0000 mg | ORAL_TABLET | Freq: Every day | ORAL | Status: DC

## 2013-08-22 NOTE — Telephone Encounter (Signed)
Patient states she called the triage line last night with c/o UTI. Patient was told to contact the office today. Patient c/o frequency and urgency, also bladder pain. She seems sure that she has a UTI. Offered lab appointment for urine culture, but patient would like to try the protocol first. Macrobid/ Diflucan sent to the pharmacy. Other: patient states her irritation is much better-see previous call.

## 2013-09-12 ENCOUNTER — Emergency Department (HOSPITAL_COMMUNITY)
Admission: EM | Admit: 2013-09-12 | Discharge: 2013-09-12 | Disposition: A | Discharge status: Home / Self Care | Payer: PRIVATE HEALTH INSURANCE | Source: Home / Self Care | Attending: Family Medicine | Admitting: Family Medicine

## 2013-09-12 ENCOUNTER — Encounter (HOSPITAL_COMMUNITY): Payer: Self-pay | Admitting: Emergency Medicine

## 2013-09-12 ENCOUNTER — Other Ambulatory Visit (HOSPITAL_COMMUNITY)
Admission: RE | Admit: 2013-09-12 | Discharge: 2013-09-12 | Disposition: A | Discharge status: Home / Self Care | Payer: PRIVATE HEALTH INSURANCE | Source: Ambulatory Visit | Attending: Family Medicine | Admitting: Family Medicine

## 2013-09-12 DIAGNOSIS — Z113 Encounter for screening for infections with a predominantly sexual mode of transmission: Secondary | ICD-10-CM | POA: Insufficient documentation

## 2013-09-12 DIAGNOSIS — N760 Acute vaginitis: Secondary | ICD-10-CM | POA: Insufficient documentation

## 2013-09-12 LAB — POCT URINALYSIS DIP (DEVICE)
BILIRUBIN URINE: NEGATIVE
Glucose, UA: NEGATIVE mg/dL
Hgb urine dipstick: NEGATIVE
KETONES UR: NEGATIVE mg/dL
Leukocytes, UA: NEGATIVE
Nitrite: NEGATIVE
PH: 6 (ref 5.0–8.0)
PROTEIN: NEGATIVE mg/dL
SPECIFIC GRAVITY, URINE: 1.015 (ref 1.005–1.030)
Urobilinogen, UA: 0.2 mg/dL (ref 0.0–1.0)

## 2013-09-12 LAB — POCT PREGNANCY, URINE: PREG TEST UR: NEGATIVE

## 2013-09-12 MED ORDER — METRONIDAZOLE 500 MG PO TABS: 500.0000 mg | ORAL_TABLET | Freq: Two times a day (BID) | ORAL | Status: DC

## 2013-09-12 MED ORDER — FLUCONAZOLE 150 MG PO TABS: 150.0000 mg | ORAL_TABLET | Freq: Once | ORAL | Status: DC

## 2013-09-12 NOTE — ED Notes (Signed)
C/o vaginal irritation with odor and discharge today after using scented feminine wash last night.  Hx of recurrent vaginitis.

## 2013-09-12 NOTE — ED Provider Notes (Signed)
Victoria George is a 38 y.o. female who presents to Urgent Care today for vaginal irritation starting yesterday. Irritation occurred after patient used scented feminine wash. She denies douching. She notes her symptoms are consistent with prior episodes of bacterial vaginosis. She denies any fevers or chills nausea vomiting or diarrhea. She denies any urinary symptoms.   Past Medical History  Diagnosis Date  . Hypertension    History  Substance Use Topics  . Smoking status: Never Smoker   . Smokeless tobacco: Never Used  . Alcohol Use: Yes     Comment: occasionally   ROS as above Medications: No current facility-administered medications for this encounter.   Current Outpatient Prescriptions  Medication Sig Dispense Refill  . fluconazole (DIFLUCAN) 150 MG tablet Take 1 tablet (150 mg total) by mouth once.  1 tablet  2  . metroNIDAZOLE (FLAGYL) 500 MG tablet Take 1 tablet (500 mg total) by mouth 2 (two) times daily.  14 tablet  2  . [DISCONTINUED] pantoprazole (PROTONIX) 40 MG tablet Take 40 mg by mouth every morning.      . [DISCONTINUED] promethazine (PHENERGAN) 25 MG tablet Take 1 tablet (25 mg total) by mouth every 6 (six) hours as needed for nausea.  30 tablet  0    Exam:  BP 151/90  Pulse 74  Temp(Src) 98.1 F (36.7 C) (Oral)  Resp 16  SpO2 100%  LMP 09/03/2013 Gen: Well NAD GYN: Normal external genitalia. Vaginal canal with thin white discharge. Normal-appearing cervix. Nontender.  Results for orders placed during the hospital encounter of 09/12/13 (from the past 24 hour(s))  POCT URINALYSIS DIP (DEVICE)     Status: None   Collection Time    09/12/13  7:46 PM      Result Value Ref Range   Glucose, UA NEGATIVE  NEGATIVE mg/dL   Bilirubin Urine NEGATIVE  NEGATIVE   Ketones, ur NEGATIVE  NEGATIVE mg/dL   Specific Gravity, Urine 1.015  1.005 - 1.030   Hgb urine dipstick NEGATIVE  NEGATIVE   pH 6.0  5.0 - 8.0   Protein, ur NEGATIVE  NEGATIVE mg/dL   Urobilinogen, UA  0.2  0.0 - 1.0 mg/dL   Nitrite NEGATIVE  NEGATIVE   Leukocytes, UA NEGATIVE  NEGATIVE  POCT PREGNANCY, URINE     Status: None   Collection Time    09/12/13  7:59 PM      Result Value Ref Range   Preg Test, Ur NEGATIVE  NEGATIVE   No results found.  Assessment and Plan: 38 y.o. female with vaginitis. Likely bacterial vaginosis. Cytology pending. Empiric treatment with metronidazole and fluconazole.  Discussed warning signs or symptoms. Please see discharge instructions. Patient expresses understanding.    Gregor Hams, MD 09/12/13 2012

## 2013-09-12 NOTE — Discharge Instructions (Signed)
Thank you for coming in today. If your belly pain worsens, or you have high fever, bad vomiting, blood in your stool or black tarry stool go to the Emergency Room.    Bacterial Vaginosis Bacterial vaginosis is a vaginal infection that occurs when the normal balance of bacteria in the vagina is disrupted. It results from an overgrowth of certain bacteria. This is the most common vaginal infection in women of childbearing age. Treatment is important to prevent complications, especially in pregnant women, as it can cause a premature delivery. CAUSES  Bacterial vaginosis is caused by an increase in harmful bacteria that are normally present in smaller amounts in the vagina. Several different kinds of bacteria can cause bacterial vaginosis. However, the reason that the condition develops is not fully understood. RISK FACTORS Certain activities or behaviors can put you at an increased risk of developing bacterial vaginosis, including:  Having a new sex partner or multiple sex partners.  Douching.  Using an intrauterine device (IUD) for contraception. Women do not get bacterial vaginosis from toilet seats, bedding, swimming pools, or contact with objects around them. SIGNS AND SYMPTOMS  Some women with bacterial vaginosis have no signs or symptoms. Common symptoms include:  Grey vaginal discharge.  A fishlike odor with discharge, especially after sexual intercourse.  Itching or burning of the vagina and vulva.  Burning or pain with urination. DIAGNOSIS  Your health care provider will take a medical history and examine the vagina for signs of bacterial vaginosis. A sample of vaginal fluid may be taken. Your health care provider will look at this sample under a microscope to check for bacteria and abnormal cells. A vaginal pH test may also be done.  TREATMENT  Bacterial vaginosis may be treated with antibiotic medicines. These may be given in the form of a pill or a vaginal cream. A second round  of antibiotics may be prescribed if the condition comes back after treatment.  HOME CARE INSTRUCTIONS   Only take over-the-counter or prescription medicines as directed by your health care provider.  If antibiotic medicine was prescribed, take it as directed. Make sure you finish it even if you start to feel better.  Do not have sex until treatment is completed.  Tell all sexual partners that you have a vaginal infection. They should see their health care provider and be treated if they have problems, such as a mild rash or itching.  Practice safe sex by using condoms and only having one sex partner. SEEK MEDICAL CARE IF:   Your symptoms are not improving after 3 days of treatment.  You have increased discharge or pain.  You have a fever. MAKE SURE YOU:   Understand these instructions.  Will watch your condition.  Will get help right away if you are not doing well or get worse. FOR MORE INFORMATION  Centers for Disease Control and Prevention, Division of STD Prevention: AppraiserFraud.fi American Sexual Health Association (ASHA): www.ashastd.org  Document Released: 03/13/2005 Document Revised: 01/01/2013 Document Reviewed: 10/23/2012 [facility] Patient Information 2015 [place], [place]. This information is not intended to replace advice given to you by your health care provider. Make sure you discuss any questions you have with your health care provider.

## 2013-09-16 NOTE — ED Notes (Signed)
GC/Chlamydia neg., Affirm: Candida and Trich neg., Gardnerella pos. Pt. adequately treated with Flagyl. Roselyn Meier 09/16/2013

## 2013-09-19 ENCOUNTER — Ambulatory Visit: Payer: PRIVATE HEALTH INSURANCE | Admitting: Obstetrics

## 2013-11-06 ENCOUNTER — Ambulatory Visit: Payer: PRIVATE HEALTH INSURANCE | Admitting: Obstetrics

## 2014-02-18 ENCOUNTER — Ambulatory Visit (INDEPENDENT_AMBULATORY_CARE_PROVIDER_SITE_OTHER): Payer: PRIVATE HEALTH INSURANCE | Admitting: Obstetrics

## 2014-02-18 ENCOUNTER — Encounter: Payer: Self-pay | Admitting: Obstetrics

## 2014-02-18 VITALS — BP 151/98 | HR 84 | Temp 98.8°F | Ht 61.0 in | Wt 204.0 lb

## 2014-02-18 DIAGNOSIS — B373 Candidiasis of vulva and vagina: Secondary | ICD-10-CM

## 2014-02-18 DIAGNOSIS — B3731 Acute candidiasis of vulva and vagina: Secondary | ICD-10-CM

## 2014-02-18 DIAGNOSIS — Z1239 Encounter for other screening for malignant neoplasm of breast: Secondary | ICD-10-CM

## 2014-02-18 DIAGNOSIS — B9689 Other specified bacterial agents as the cause of diseases classified elsewhere: Secondary | ICD-10-CM

## 2014-02-18 DIAGNOSIS — A499 Bacterial infection, unspecified: Secondary | ICD-10-CM

## 2014-02-18 DIAGNOSIS — Z01419 Encounter for gynecological examination (general) (routine) without abnormal findings: Secondary | ICD-10-CM

## 2014-02-18 DIAGNOSIS — N390 Urinary tract infection, site not specified: Secondary | ICD-10-CM

## 2014-02-18 DIAGNOSIS — N760 Acute vaginitis: Secondary | ICD-10-CM

## 2014-02-18 DIAGNOSIS — Z124 Encounter for screening for malignant neoplasm of cervix: Secondary | ICD-10-CM

## 2014-02-18 MED ORDER — FLUCONAZOLE 150 MG PO TABS: 150.0000 mg | ORAL_TABLET | Freq: Once | ORAL | Status: DC

## 2014-02-18 MED ORDER — METRONIDAZOLE 500 MG PO TABS: 500.0000 mg | ORAL_TABLET | Freq: Two times a day (BID) | ORAL | Status: DC

## 2014-02-18 MED ORDER — NITROFURANTOIN MONOHYD MACRO 100 MG PO CAPS: 100.0000 mg | ORAL_CAPSULE | Freq: Two times a day (BID) | ORAL | Status: DC

## 2014-02-18 NOTE — Progress Notes (Signed)
Subjective:     Victoria George is a 38 y.o. female here for a routine exam.  Current complaints: Urinay Frequency and malodorous vaginal discharge.    Personal health questionnaire:  Is patient Ashkenazi Jewish, have a family history of breast and/or ovarian cancer: no Is there a family history of uterine cancer diagnosed at age < 56, gastrointestinal cancer, urinary tract cancer, family member who is a Field seismologist syndrome-associated carrier: no Is the patient overweight and hypertensive, family history of diabetes, personal history of gestational diabetes or PCOS: no Is patient over 58, have PCOS,  family history of premature CHD under age 46, diabetes, smoke, have hypertension or peripheral artery disease:  no At any time, has a partner hit, kicked or otherwise hurt or frightened you?: no Over the past 2 weeks, have you felt down, depressed or hopeless?: no Over the past 2 weeks, have you felt little interest or pleasure in doing things?:no   Gynecologic History Patient's last menstrual period was 01/23/2014 (exact date). Contraception: none Last Pap: 2014. Results were: normal Last mammogram: n/a. Results were: n/a  Obstetric History OB History  Gravida Para Term Preterm AB SAB TAB Ectopic Multiple Living  0                 Past Medical History  Diagnosis Date  . Hypertension     Past Surgical History  Procedure Laterality Date  . Esophagogastroduodenoscopy  01/30/2011    Procedure: ESOPHAGOGASTRODUODENOSCOPY (EGD);  Surgeon: Zenovia Jarred, MD;  Location: [facility];  Service: Gastroenterology;  Laterality: N/A;  . Diagnostic laparoscopy    . Cholecystectomy  02/01/2011    Procedure: LAPAROSCOPIC CHOLECYSTECTOMY WITH INTRAOPERATIVE CHOLANGIOGRAM;  Surgeon: Odis Hollingshead, MD;  Location: [facility];  Service: General;  Laterality: N/A;  Needs IOC and C-arm  . Esophagogastroduodenoscopy  03/03/2011    Procedure: ESOPHAGOGASTRODUODENOSCOPY (EGD);  Surgeon: Zenovia Jarred, MD;  Location: [facility];  Service: Gastroenterology;  Laterality: N/A;    Current outpatient prescriptions: esomeprazole (NEXIUM) 20 MG capsule, Take 20 mg by mouth daily at 12 noon., Disp: , Rfl: ;  fluconazole (DIFLUCAN) 150 MG tablet, Take 1 tablet (150 mg total) by mouth once., Disp: 1 tablet, Rfl: 2;  metroNIDAZOLE (FLAGYL) 500 MG tablet, Take 1 tablet (500 mg total) by mouth 2 (two) times daily., Disp: 14 tablet, Rfl: 2 nitrofurantoin, macrocrystal-monohydrate, (MACROBID) 100 MG capsule, Take 1 capsule (100 mg total) by mouth 2 (two) times daily., Disp: 14 capsule, Rfl: 0;  [DISCONTINUED] pantoprazole (PROTONIX) 40 MG tablet, Take 40 mg by mouth every morning., Disp: , Rfl: ;  [DISCONTINUED] promethazine (PHENERGAN) 25 MG tablet, Take 1 tablet (25 mg total) by mouth every 6 (six) hours as needed for nausea., Disp: 30 tablet, Rfl: 0 Allergies  Allergen Reactions  . Shellfish Allergy Anaphylaxis  . Tramadol     Racing thoughts, Insomnia and SI.  Marland Kitchen Hydrocodone Rash  . Promethazine Hcl Nausea And Vomiting and Palpitations    History  Substance Use Topics  . Smoking status: Never Smoker   . Smokeless tobacco: Never Used  . Alcohol Use: Yes     Comment: occasionally    Family History  Problem Relation Age of Onset  . Hypertension Mother   . Anesthesia problems Neg Hx   . Malignant hyperthermia Neg Hx   . Pseudochol deficiency Neg Hx   . Colon cancer Maternal Grandmother   . Diabetes Brother   . Diabetes Maternal Grandmother   . Diabetes Paternal Grandmother   .  Diverticulosis Paternal Grandmother   . Kidney disease Maternal Grandmother       Review of Systems  Constitutional: negative for fatigue and weight loss Respiratory: negative for cough and wheezing Cardiovascular: negative for chest pain, fatigue and palpitations Gastrointestinal: negative for abdominal pain and change in bowel habits Musculoskeletal:negative for myalgias Neurological: negative for gait problems and  tremors Behavioral/Psych: negative for abusive relationship, depression Endocrine: negative for temperature intolerance   Genitourinary:negative for abnormal menstrual periods, genital lesions, hot flashes, sexual problems and vaginal discharge Integument/breast: negative for breast lump, breast tenderness, nipple discharge and skin lesion(s)    Objective:       BP 151/98 mmHg  Pulse 84  Temp(Src) 98.8 F (37.1 C)  Ht 5' 1"  (1.549 m)  Wt 204 lb (92.534 kg)  BMI 38.57 kg/m2  LMP 01/23/2014 (Exact Date) General:   alert  Skin:   no rash or abnormalities  Lungs:   clear to auscultation bilaterally  Heart:   regular rate and rhythm, S1, S2 normal, no murmur, click, rub or gallop  Breasts:   normal without suspicious masses, skin or nipple changes or axillary nodes  Abdomen:  normal findings: no organomegaly, soft, non-tender and no hernia  Pelvis:  External genitalia: normal general appearance Urinary system: urethral meatus normal and bladder without fullness, nontender Vaginal: normal without tenderness, induration or masses Cervix: normal appearance Adnexa: normal bimanual exam Uterus: anteverted and non-tender, normal size   Lab Review Urine pregnancy test Labs reviewed yes Radiologic studies reviewed no    Assessment:    Healthy female exam.    UTI  BV  Considering IVF with Dr. Kerin Perna   Plan:   Macrobid Rx Flagyl Rx   Education reviewed: low fat, low cholesterol diet, self breast exams and weight bearing exercise. Mammogram ordered.   Meds ordered this encounter  Medications  . esomeprazole (NEXIUM) 20 MG capsule    Sig: Take 20 mg by mouth daily at 12 noon.  . nitrofurantoin, macrocrystal-monohydrate, (MACROBID) 100 MG capsule    Sig: Take 1 capsule (100 mg total) by mouth 2 (two) times daily.    Dispense:  14 capsule    Refill:  0  . metroNIDAZOLE (FLAGYL) 500 MG tablet    Sig: Take 1 tablet (500 mg total) by mouth 2 (two) times daily.     Dispense:  14 tablet    Refill:  2  . fluconazole (DIFLUCAN) 150 MG tablet    Sig: Take 1 tablet (150 mg total) by mouth once.    Dispense:  1 tablet    Refill:  2   Orders Placed This Encounter  Procedures  . MM Digital Screening    [facility]med[facility]/patient has declined [id number].    Standing Status: Future     Number of Occurrences:      Standing Expiration Date: 04/21/2015    Order Specific Question:  Reason for Exam (SYMPTOM  OR DIAGNOSIS REQUIRED)    Answer:  screening    Order Specific Question:  Is the patient pregnant?    Answer:  No    Order Specific Question:  Preferred imaging location?    Answer:  [facility]

## 2014-02-18 NOTE — Addendum Note (Signed)
Addended by: Ladona Ridgel on: 02/18/2014 05:13 PM   Modules accepted: Orders

## 2014-02-19 LAB — WET PREP BY MOLECULAR PROBE
CANDIDA SPECIES: NEGATIVE
Gardnerella vaginalis: POSITIVE — AB
TRICHOMONAS VAG: NEGATIVE

## 2014-02-19 LAB — GC/CHLAMYDIA PROBE AMP
CT Probe RNA: NEGATIVE
GC Probe RNA: NEGATIVE

## 2014-02-20 ENCOUNTER — Other Ambulatory Visit: Payer: Self-pay | Admitting: Obstetrics

## 2014-02-20 LAB — URINE CULTURE
COLONY COUNT: NO GROWTH
Organism ID, Bacteria: NO GROWTH

## 2014-02-23 LAB — PAP IG AND HPV HIGH-RISK: HPV DNA HIGH RISK: NOT DETECTED

## 2014-02-24 ENCOUNTER — Other Ambulatory Visit: Payer: Self-pay | Admitting: *Deleted

## 2014-02-24 ENCOUNTER — Telehealth: Payer: Self-pay | Admitting: *Deleted

## 2014-02-24 DIAGNOSIS — B3731 Acute candidiasis of vulva and vagina: Secondary | ICD-10-CM

## 2014-02-24 DIAGNOSIS — B373 Candidiasis of vulva and vagina: Secondary | ICD-10-CM

## 2014-02-24 MED ORDER — FLUCONAZOLE 150 MG PO TABS: 150.0000 mg | ORAL_TABLET | Freq: Once | ORAL | Status: DC

## 2014-02-24 NOTE — Telephone Encounter (Signed)
Patient called to report she was treated for UTI and BV at her last appointment. She has had n/v for the last 3 days and thinks it is a reaction to her medication. Patient is going to stop her metronidazole and finish her Macrobid. Diflucan sent to the pharmacy- it printed instead of E scribed. Patient to see if her symptoms subside and will follow up in 6 weeks to make sure UTI is gone.

## 2014-03-05 ENCOUNTER — Ambulatory Visit (HOSPITAL_COMMUNITY)
Admission: RE | Admit: 2014-03-05 | Discharge: 2014-03-05 | Disposition: A | Discharge status: Home / Self Care | Payer: PRIVATE HEALTH INSURANCE | Source: Ambulatory Visit | Attending: Obstetrics | Admitting: Obstetrics

## 2014-03-05 DIAGNOSIS — Z1231 Encounter for screening mammogram for malignant neoplasm of breast: Secondary | ICD-10-CM | POA: Insufficient documentation

## 2014-03-05 DIAGNOSIS — Z1239 Encounter for other screening for malignant neoplasm of breast: Secondary | ICD-10-CM

## 2014-03-09 ENCOUNTER — Other Ambulatory Visit: Payer: Self-pay | Admitting: Obstetrics

## 2014-03-09 DIAGNOSIS — R928 Other abnormal and inconclusive findings on diagnostic imaging of breast: Secondary | ICD-10-CM

## 2014-03-18 ENCOUNTER — Other Ambulatory Visit: Payer: Self-pay | Admitting: Obstetrics

## 2014-03-18 ENCOUNTER — Other Ambulatory Visit: Payer: Self-pay

## 2014-03-18 DIAGNOSIS — R928 Other abnormal and inconclusive findings on diagnostic imaging of breast: Secondary | ICD-10-CM

## 2014-03-23 ENCOUNTER — Emergency Department (HOSPITAL_COMMUNITY)
Admission: EM | Admit: 2014-03-23 | Discharge: 2014-03-23 | Disposition: A | Discharge status: Home / Self Care | Payer: PRIVATE HEALTH INSURANCE | Attending: Emergency Medicine | Admitting: Emergency Medicine

## 2014-03-23 ENCOUNTER — Emergency Department (HOSPITAL_COMMUNITY): Payer: PRIVATE HEALTH INSURANCE

## 2014-03-23 ENCOUNTER — Encounter (HOSPITAL_COMMUNITY): Payer: Self-pay | Admitting: Family Medicine

## 2014-03-23 DIAGNOSIS — R200 Anesthesia of skin: Secondary | ICD-10-CM | POA: Insufficient documentation

## 2014-03-23 DIAGNOSIS — I10 Essential (primary) hypertension: Secondary | ICD-10-CM | POA: Insufficient documentation

## 2014-03-23 DIAGNOSIS — M5442 Lumbago with sciatica, left side: Secondary | ICD-10-CM

## 2014-03-23 DIAGNOSIS — M5441 Lumbago with sciatica, right side: Secondary | ICD-10-CM

## 2014-03-23 DIAGNOSIS — R52 Pain, unspecified: Secondary | ICD-10-CM

## 2014-03-23 DIAGNOSIS — Z79899 Other long term (current) drug therapy: Secondary | ICD-10-CM | POA: Diagnosis not present

## 2014-03-23 DIAGNOSIS — E669 Obesity, unspecified: Secondary | ICD-10-CM | POA: Diagnosis not present

## 2014-03-23 DIAGNOSIS — M545 Low back pain: Secondary | ICD-10-CM | POA: Diagnosis present

## 2014-03-23 DIAGNOSIS — M5440 Lumbago with sciatica, unspecified side: Secondary | ICD-10-CM | POA: Diagnosis not present

## 2014-03-23 MED ORDER — IBUPROFEN 800 MG PO TABS
800.0000 mg | ORAL_TABLET | Freq: Once | ORAL | Status: AC
  Administered 2014-03-23: 800 mg via ORAL
  Filled 2014-03-23: qty 1

## 2014-03-23 MED ORDER — OXYCODONE-ACETAMINOPHEN 5-325 MG PO TABS: 1.0000 | ORAL_TABLET | Freq: Four times a day (QID) | ORAL | Status: DC | PRN

## 2014-03-23 MED ORDER — OXYCODONE-ACETAMINOPHEN 5-325 MG PO TABS
1.0000 | ORAL_TABLET | Freq: Once | ORAL | Status: AC
  Administered 2014-03-23: 1 via ORAL
  Filled 2014-03-23: qty 1

## 2014-03-23 MED ORDER — HYDROMORPHONE HCL 1 MG/ML IJ SOLN
1.0000 mg | Freq: Once | INTRAMUSCULAR | Status: AC
  Administered 2014-03-23: 1 mg via INTRAMUSCULAR
  Filled 2014-03-23: qty 1

## 2014-03-23 MED ORDER — IBUPROFEN 200 MG PO TABS
400.0000 mg | ORAL_TABLET | Freq: Once | ORAL | Status: AC
  Administered 2014-03-23: 400 mg via ORAL
  Filled 2014-03-23: qty 2

## 2014-03-23 MED ORDER — IBUPROFEN 800 MG PO TABS: 800.0000 mg | ORAL_TABLET | Freq: Three times a day (TID) | ORAL | Status: DC

## 2014-03-23 NOTE — ED Notes (Signed)
Pt to return home via [place] with husband. Pt is in stable condition and is escorted from ED via wheelchair by ED RN. Pt verbalizes understanding rt d/c instructions and medications.

## 2014-03-23 NOTE — ED Provider Notes (Signed)
CSN: [id number]     Arrival date & time 03/23/14  4098 History   First MD Initiated Contact with Patient 03/23/14 [phone]     Chief Complaint  Patient presents with  . Back Pain  . Numbness     (Consider location/radiation/quality/duration/timing/severity/associated sxs/prior Treatment) HPI Comments: The patient is a 38 year old female with past medical history of hypertension presenting to the emergency department chief complaint of low back pain for 2 days. Patient denies injury or fall. Patient reports constant low back pain worsened with movement. She reports radiation into the bilateral lower extremities, right greater than left. Patient denies previous back pain. Exacerbated by movement and pressure. Denies urinary retention, urinary incontinence, fecal incontinence, fever, chills, history of back surgeries, history of cancers, IV drug use.  Reports increase in discomfort with walking and sensation of legs giving out secondary to pain. No fall.  Patient is a 38 y.o. female presenting with back pain. The history is provided by the patient. No language interpreter was used.  Back Pain Associated symptoms: no abdominal pain, no dysuria and no fever     Past Medical History  Diagnosis Date  . Hypertension    Past Surgical History  Procedure Laterality Date  . Esophagogastroduodenoscopy  01/30/2011    Procedure: ESOPHAGOGASTRODUODENOSCOPY (EGD);  Surgeon: Zenovia Jarred, MD;  Location: [facility];  Service: Gastroenterology;  Laterality: N/A;  . Diagnostic laparoscopy    . Cholecystectomy  02/01/2011    Procedure: LAPAROSCOPIC CHOLECYSTECTOMY WITH INTRAOPERATIVE CHOLANGIOGRAM;  Surgeon: Odis Hollingshead, MD;  Location: [facility];  Service: General;  Laterality: N/A;  Needs IOC and C-arm  . Esophagogastroduodenoscopy  03/03/2011    Procedure: ESOPHAGOGASTRODUODENOSCOPY (EGD);  Surgeon: Zenovia Jarred, MD;  Location: [facility];  Service: Gastroenterology;  Laterality: N/A;   Family History    Problem Relation Age of Onset  . Hypertension Mother   . Anesthesia problems Neg Hx   . Malignant hyperthermia Neg Hx   . Pseudochol deficiency Neg Hx   . Colon cancer Maternal Grandmother   . Diabetes Brother   . Diabetes Maternal Grandmother   . Diabetes Paternal Grandmother   . Diverticulosis Paternal Grandmother   . Kidney disease Maternal Grandmother    History  Substance Use Topics  . Smoking status: Never Smoker   . Smokeless tobacco: Never Used  . Alcohol Use: Yes     Comment: occasionally   OB History    Gravida Para Term Preterm AB TAB SAB Ectopic Multiple Living   0              Review of Systems  Constitutional: Negative for fever and chills.  Gastrointestinal: Negative for abdominal pain, diarrhea and constipation.  Genitourinary: Negative for dysuria, hematuria, decreased urine volume and difficulty urinating.  Musculoskeletal: Positive for back pain.  Skin: Negative for color change and wound.      Allergies  Shellfish allergy; Flagyl; Tramadol; Hydrocodone; and Promethazine hcl  Home Medications   Prior to Admission medications   Medication Sig Start Date End Date Taking? Authorizing Provider  esomeprazole (NEXIUM) 20 MG capsule Take 20 mg by mouth daily at 12 noon.    Historical Provider, MD  fluconazole (DIFLUCAN) 150 MG tablet Take 1 tablet (150 mg total) by mouth once. 02/24/14   Shelly Bombard, MD  nitrofurantoin, macrocrystal-monohydrate, (MACROBID) 100 MG capsule Take 1 capsule (100 mg total) by mouth 2 (two) times daily. 02/18/14   Shelly Bombard, MD   BP 138/101 mmHg  Pulse 79  Temp(Src)  97.5 F (36.4 C) (Oral)  Resp 18  SpO2 100%  LMP 03/17/2014 Physical Exam  Constitutional: She is oriented to person, place, and time. She appears well-developed and well-nourished. No distress.  Obese female.  HENT:  Head: Normocephalic and atraumatic.  Neck: Neck supple.  Pulmonary/Chest: Effort normal. No respiratory distress.  Abdominal:  Soft. There is no tenderness. There is no rebound.  Genitourinary:  Normal rectal tone. Chaperone present. No stool in rectal vault.  Musculoskeletal:       Back:  Palpation of Right SI joint recreates discomfort in lower extremity.  Palpation of left SI joint partially recreates discomfort.  Neurological: She is alert and oriented to person, place, and time. A sensory deficit is present. She exhibits normal muscle tone.  Reflex Scores:      Patellar reflexes are 2+ on the right side and 2+ on the left side.      Achilles reflexes are 1+ on the right side and 1+ on the left side. Limping gait secondary to pain.  Skin: Skin is warm and dry. She is not diaphoretic.  Psychiatric: She has a normal mood and affect. Her behavior is normal.  Nursing note and vitals reviewed.   ED Course  Procedures (including critical care time) Labs Review Labs Reviewed - No data to display  Imaging Review Dg Lumbar Spine 2-3 Views  03/23/2014   CLINICAL DATA:  Low back pain radiating into the pelvis for 1 day. No acute injury or prior relevant surgery. Initial encounter.  EXAM: LUMBAR SPINE - 2-3 VIEW  COMPARISON:  Acute abdominal series done 04/19/2012. Abdominal pelvic CT 02/24/2011.  FINDINGS: There are 5 lumbar type vertebral bodies. The alignment is normal aside from a minimal convex left scoliosis which may be positional. The disc spaces are preserved. There is no evidence of fracture or pars defect. Cholecystectomy clips are noted.  IMPRESSION: Negative lumbar spine radiographs aside from a mild scoliosis which may be positional.   Electronically Signed   By: Camie Patience M.D.   On: 03/23/2014 08:47   Dg Sacrum/coccyx  03/23/2014   CLINICAL DATA:  Pain.  EXAM: SACRUM AND COCCYX - 2+ VIEW  COMPARISON:  Abdominal radiographs 04/19/2012. CT abdomen and pelvis 02/24/2011.  FINDINGS: There is no evidence of fracture or other focal bone lesions.  IMPRESSION: Negative.   Electronically Signed   By: Logan Bores   On: 03/23/2014 08:47     EKG Interpretation None      MDM   Final diagnoses:  Bilateral low back pain with sciatica, sciatica laterality unspecified   Patient with low back pain, with bilateral lower extremity discomfort. Patient is able to emulate with limping gait. Normal equal reflexes bilaterally, normal sensation, normal strength to lower extremities. Reevaluation patient denies relief after oral medication. Normal rectal tone. Doubt cauda equina or central stenosis. Likely sciatic pain. Plan to treat with anti-inflammatories, narcotic pain medication, follow-up with PCP or specialist as needed. Meds given in ED:  Medications  oxyCODONE-acetaminophen (PERCOCET/ROXICET) 5-325 MG per tablet 1 tablet (1 tablet Oral Given 03/23/14 0853)  ibuprofen (ADVIL,MOTRIN) tablet 400 mg (400 mg Oral Given 03/23/14 0853)  oxyCODONE-acetaminophen (PERCOCET/ROXICET) 5-325 MG per tablet 1 tablet (1 tablet Oral Given 03/23/14 0959)  ibuprofen (ADVIL,MOTRIN) tablet 800 mg (800 mg Oral Given 03/23/14 0959)  HYDROmorphone (DILAUDID) injection 1 mg (1 mg Intramuscular Given 03/23/14 1053)    Discharge Medication List as of 03/23/2014 10:39 AM    START taking these medications   Details  ibuprofen (ADVIL,MOTRIN) 800 MG tablet Take 1 tablet (800 mg total) by mouth 3 (three) times daily., Starting 03/23/2014, Until Discontinued, Print    oxyCODONE-acetaminophen (PERCOCET/ROXICET) 5-325 MG per tablet Take 1 tablet by mouth every 6 (six) hours as needed for moderate pain or severe pain., Starting 03/23/2014, Until Discontinued, Print        Harvie Heck, PA-C 03/23/14 [place], MD 03/23/14 [phone]

## 2014-03-23 NOTE — ED Notes (Signed)
Per pt sts lower back pain radiating into her pelvis area. sts sometimes when she walks her legs give out and go numb. Pt denies fall or injury. sts started yesterday.

## 2014-03-23 NOTE — Discharge Instructions (Signed)
Call for a follow up appointment with a Family or Primary Care Provider.  Call a back specialist for further evaluation of your back pain and possible imaging if continued pain. Take ibuprofen with food 3 times a day, narcotic pain medication for breakthrough pain. Return if Symptoms worsen.   Take medication as prescribed.

## 2014-03-25 ENCOUNTER — Encounter: Payer: Self-pay | Admitting: Obstetrics

## 2014-03-25 ENCOUNTER — Ambulatory Visit (INDEPENDENT_AMBULATORY_CARE_PROVIDER_SITE_OTHER): Payer: PRIVATE HEALTH INSURANCE | Admitting: Obstetrics

## 2014-03-25 ENCOUNTER — Ambulatory Visit
Admission: RE | Admit: 2014-03-25 | Discharge: 2014-03-25 | Disposition: A | Discharge status: Home / Self Care | Payer: PRIVATE HEALTH INSURANCE | Source: Ambulatory Visit | Attending: Obstetrics | Admitting: Obstetrics

## 2014-03-25 VITALS — BP 146/96 | HR 76 | Temp 98.5°F | Ht 61.0 in | Wt 208.0 lb

## 2014-03-25 DIAGNOSIS — R928 Other abnormal and inconclusive findings on diagnostic imaging of breast: Secondary | ICD-10-CM

## 2014-03-25 DIAGNOSIS — IMO0002 Reserved for concepts with insufficient information to code with codable children: Secondary | ICD-10-CM

## 2014-03-25 DIAGNOSIS — R896 Abnormal cytological findings in specimens from other organs, systems and tissues: Secondary | ICD-10-CM

## 2014-03-26 ENCOUNTER — Encounter: Payer: Self-pay | Admitting: Obstetrics

## 2014-03-26 NOTE — Progress Notes (Signed)
Pt notified about lab results.    A/P:  ASCUS with negative High Risk HPV DNA.         Recommended repeat pap 1 year.         All questions answered.         Educational material dispensed to patient.  Baltazar Najjar MD

## 2014-08-12 ENCOUNTER — Other Ambulatory Visit: Payer: Self-pay | Admitting: Obstetrics

## 2014-08-12 ENCOUNTER — Other Ambulatory Visit: Payer: Self-pay

## 2014-08-12 DIAGNOSIS — N6320 Unspecified lump in the left breast, unspecified quadrant: Secondary | ICD-10-CM

## 2014-08-13 ENCOUNTER — Ambulatory Visit (INDEPENDENT_AMBULATORY_CARE_PROVIDER_SITE_OTHER): Payer: PRIVATE HEALTH INSURANCE | Admitting: Obstetrics

## 2014-08-13 ENCOUNTER — Encounter: Payer: Self-pay | Admitting: Obstetrics

## 2014-08-13 VITALS — BP 151/105 | HR 99 | Temp 97.6°F | Wt 199.0 lb

## 2014-08-13 DIAGNOSIS — D251 Intramural leiomyoma of uterus: Secondary | ICD-10-CM

## 2014-08-13 DIAGNOSIS — N760 Acute vaginitis: Secondary | ICD-10-CM

## 2014-08-13 DIAGNOSIS — Z3169 Encounter for other general counseling and advice on procreation: Secondary | ICD-10-CM | POA: Diagnosis not present

## 2014-08-13 DIAGNOSIS — B9689 Other specified bacterial agents as the cause of diseases classified elsewhere: Secondary | ICD-10-CM

## 2014-08-13 DIAGNOSIS — A499 Bacterial infection, unspecified: Secondary | ICD-10-CM | POA: Diagnosis not present

## 2014-08-13 MED ORDER — FOLIC ACID 1 MG PO TABS: 1.0000 mg | ORAL_TABLET | Freq: Every day | ORAL | Status: DC

## 2014-08-13 MED ORDER — METRONIDAZOLE 0.75 % VA GEL: 1.0000 | Freq: Two times a day (BID) | VAGINAL | Status: DC

## 2014-08-13 NOTE — Progress Notes (Signed)
Patient ID: Victoria George, female   DOB: Jan 10, 1976, 39 y.o.   MRN: [id number]  Chief Complaint  Patient presents with  . Fibroids  . Vaginitis    HPI Victoria George is a 39 y.o. female.  Malodorous vaginal discharge.  H/O uterine fibroids.  HPI  Past Medical History  Diagnosis Date  . Hypertension   . Sciatica     Past Surgical History  Procedure Laterality Date  . Esophagogastroduodenoscopy  01/30/2011    Procedure: ESOPHAGOGASTRODUODENOSCOPY (EGD);  Surgeon: Zenovia Jarred, MD;  Location: [facility];  Service: Gastroenterology;  Laterality: N/A;  . Diagnostic laparoscopy    . Cholecystectomy  02/01/2011    Procedure: LAPAROSCOPIC CHOLECYSTECTOMY WITH INTRAOPERATIVE CHOLANGIOGRAM;  Surgeon: Odis Hollingshead, MD;  Location: [facility];  Service: General;  Laterality: N/A;  Needs IOC and C-arm  . Esophagogastroduodenoscopy  03/03/2011    Procedure: ESOPHAGOGASTRODUODENOSCOPY (EGD);  Surgeon: Zenovia Jarred, MD;  Location: [facility];  Service: Gastroenterology;  Laterality: N/A;    Family History  Problem Relation Age of Onset  . Hypertension Mother   . Anesthesia problems Neg Hx   . Malignant hyperthermia Neg Hx   . Pseudochol deficiency Neg Hx   . Colon cancer Maternal Grandmother   . Diabetes Brother   . Diabetes Maternal Grandmother   . Diabetes Paternal Grandmother   . Diverticulosis Paternal Grandmother   . Kidney disease Maternal Grandmother     Social History History  Substance Use Topics  . Smoking status: Never Smoker   . Smokeless tobacco: Never Used  . Alcohol Use: Yes     Comment: occasionally    Allergies  Allergen Reactions  . Shellfish Allergy Anaphylaxis  . Flagyl [Metronidazole] Nausea And Vomiting  . Tramadol Other (See Comments)    Racing thoughts, Insomnia and SI.  Marland Kitchen Hydrocodone Rash  . Promethazine Hcl Nausea And Vomiting and Palpitations    Current Outpatient Prescriptions  Medication Sig Dispense Refill  . albuterol (PROVENTIL HFA;VENTOLIN  HFA) 108 (90 BASE) MCG/ACT inhaler Inhale 2 puffs into the lungs every 6 (six) hours as needed for wheezing or shortness of breath.    . budesonide-formoterol (SYMBICORT) 80-4.5 MCG/ACT inhaler Inhale 2 puffs into the lungs daily.    Marland Kitchen esomeprazole (NEXIUM) 20 MG capsule Take 20 mg by mouth daily at 12 noon.    Marland Kitchen ibuprofen (ADVIL,MOTRIN) 800 MG tablet Take 1 tablet (800 mg total) by mouth 3 (three) times daily. 21 tablet 0  . Multiple Vitamin (MULTIVITAMIN WITH MINERALS) TABS tablet Take 1 tablet by mouth daily.    . naproxen sodium (ANAPROX) 220 MG tablet Take 220 mg by mouth 2 (two) times daily with a meal.    . folic acid (FOLVITE) 1 MG tablet Take 1 tablet (1 mg total) by mouth daily. 90 tablet 3  . metroNIDAZOLE (METROGEL VAGINAL) 0.75 % vaginal gel Place 1 Applicatorful vaginally 2 (two) times daily. 70 g 2  . [DISCONTINUED] pantoprazole (PROTONIX) 40 MG tablet Take 40 mg by mouth every morning.    . [DISCONTINUED] promethazine (PHENERGAN) 25 MG tablet Take 1 tablet (25 mg total) by mouth every 6 (six) hours as needed for nausea. 30 tablet 0   No current facility-administered medications for this visit.    Review of Systems Review of Systems Constitutional: negative for fatigue and weight loss Respiratory: negative for cough and wheezing Cardiovascular: negative for chest pain, fatigue and palpitations Gastrointestinal: negative for abdominal pain and change in bowel habits Genitourinary: malodorous vaginal discharge.  Fibroids.  Integument/breast: negative for nipple discharge Musculoskeletal:negative for myalgias Neurological: negative for gait problems and tremors Behavioral/Psych: negative for abusive relationship, depression Endocrine: negative for temperature intolerance     Blood pressure 151/105, pulse 99, temperature 97.6 F (36.4 C), weight 199 lb (90.266 kg), last menstrual period 08/02/2014.  Physical Exam Physical Exam: Abdomen:  normal findings: no organomegaly,  soft, non-tender and no hernia  Pelvis:  External genitalia: normal general appearance Urinary system: urethral meatus normal and bladder without fullness, nontender Vaginal: normal without tenderness, induration or masses Cervix: normal appearance Adnexa: normal bimanual exam Uterus: anteverted and non-tender, normal size      Data Reviewed ultrasound  Assessment     BV Uterine Fibroids, asymptomatic.     Plan    MetroGel Vaginal Rx Will follow fibroids clinically. F/U 6 months for pap  Orders Placed This Encounter  Procedures  . SureSwab, Vaginosis/Vaginitis Plus   Meds ordered this encounter  Medications  . folic acid (FOLVITE) 1 MG tablet    Sig: Take 1 tablet (1 mg total) by mouth daily.    Dispense:  90 tablet    Refill:  3  . metroNIDAZOLE (METROGEL VAGINAL) 0.75 % vaginal gel    Sig: Place 1 Applicatorful vaginally 2 (two) times daily.    Dispense:  70 g    Refill:  2

## 2014-08-14 ENCOUNTER — Encounter: Payer: Self-pay | Admitting: Obstetrics

## 2014-08-17 ENCOUNTER — Other Ambulatory Visit: Payer: Self-pay | Admitting: Obstetrics

## 2014-08-17 LAB — SURESWAB, VAGINOSIS/VAGINITIS PLUS
Atopobium vaginae: NOT DETECTED Log (cells/mL)
C. ALBICANS, DNA: NOT DETECTED
C. PARAPSILOSIS, DNA: NOT DETECTED
C. glabrata, DNA: NOT DETECTED
C. trachomatis RNA, TMA: NOT DETECTED
C. tropicalis, DNA: NOT DETECTED
Gardnerella vaginalis: 6 Log (cells/mL)
LACTOBACILLUS SPECIES: NOT DETECTED Log (cells/mL)
MEGASPHAERA SPECIES: NOT DETECTED Log (cells/mL)
N. gonorrhoeae RNA, TMA: NOT DETECTED
T. VAGINALIS RNA, QL TMA: NOT DETECTED

## 2014-09-07 ENCOUNTER — Ambulatory Visit
Admission: RE | Admit: 2014-09-07 | Discharge: 2014-09-07 | Disposition: A | Discharge status: Home / Self Care | Payer: PRIVATE HEALTH INSURANCE | Source: Ambulatory Visit | Attending: Obstetrics | Admitting: Obstetrics

## 2014-09-07 DIAGNOSIS — N6320 Unspecified lump in the left breast, unspecified quadrant: Secondary | ICD-10-CM

## 2014-10-05 ENCOUNTER — Telehealth: Payer: Self-pay

## 2014-10-05 NOTE — Telephone Encounter (Signed)
[facility] FAXED MEDICAL RECORDS FROM THEM TO [facility], [facility] OF [facility] IN [place], [place], [place] [phone], PER PHONE CALL FROM [facility], FAX RECORDS  TO [facility].

## 2014-11-03 ENCOUNTER — Emergency Department (INDEPENDENT_AMBULATORY_CARE_PROVIDER_SITE_OTHER): Payer: PRIVATE HEALTH INSURANCE

## 2014-11-03 ENCOUNTER — Encounter (HOSPITAL_COMMUNITY): Payer: Self-pay | Admitting: Emergency Medicine

## 2014-11-03 ENCOUNTER — Emergency Department (HOSPITAL_COMMUNITY)
Admission: EM | Admit: 2014-11-03 | Discharge: 2014-11-03 | Disposition: A | Discharge status: Home / Self Care | Payer: PRIVATE HEALTH INSURANCE | Source: Home / Self Care | Attending: Emergency Medicine | Admitting: Emergency Medicine

## 2014-11-03 DIAGNOSIS — S93602A Unspecified sprain of left foot, initial encounter: Secondary | ICD-10-CM

## 2014-11-03 MED ORDER — IBUPROFEN 800 MG PO TABS
800.0000 mg | ORAL_TABLET | Freq: Three times a day (TID) | ORAL | Status: DC | PRN
Start: 2014-11-03 — End: 2015-11-06

## 2014-11-03 NOTE — ED Notes (Signed)
C/o left foot inj onset 1830 today Reports she twisted foot while stepping off a curb while checking for mail Brought back in wheel chair; pain increases when bearing wt Alert... No acute distress.

## 2014-11-03 NOTE — ED Provider Notes (Signed)
CSN: [id number]     Arrival date & time 11/03/14  1845 History   First MD Initiated Contact with Patient 11/03/14 2007     Chief Complaint  Patient presents with  . Foot Injury   (Consider location/radiation/quality/duration/timing/severity/associated sxs/prior Treatment) HPI  She is a 39 year old woman here for evaluation of left foot pain. She states she was walking when she rolled her left foot. She has pain and swelling over the lateral dorsal foot. She denies any ankle pain or difficulty with ankle movement. It is quite painful to bear weight. It is painful to move her toes.  Past Medical History  Diagnosis Date  . Hypertension   . Sciatica    Past Surgical History  Procedure Laterality Date  . Esophagogastroduodenoscopy  01/30/2011    Procedure: ESOPHAGOGASTRODUODENOSCOPY (EGD);  Surgeon: Zenovia Jarred, MD;  Location: [facility];  Service: Gastroenterology;  Laterality: N/A;  . Diagnostic laparoscopy    . Cholecystectomy  02/01/2011    Procedure: LAPAROSCOPIC CHOLECYSTECTOMY WITH INTRAOPERATIVE CHOLANGIOGRAM;  Surgeon: Odis Hollingshead, MD;  Location: [facility];  Service: General;  Laterality: N/A;  Needs IOC and C-arm  . Esophagogastroduodenoscopy  03/03/2011    Procedure: ESOPHAGOGASTRODUODENOSCOPY (EGD);  Surgeon: Zenovia Jarred, MD;  Location: [facility];  Service: Gastroenterology;  Laterality: N/A;   Family History  Problem Relation Age of Onset  . Hypertension Mother   . Anesthesia problems Neg Hx   . Malignant hyperthermia Neg Hx   . Pseudochol deficiency Neg Hx   . Colon cancer Maternal Grandmother   . Diabetes Brother   . Diabetes Maternal Grandmother   . Diabetes Paternal Grandmother   . Diverticulosis Paternal Grandmother   . Kidney disease Maternal Grandmother    History  Substance Use Topics  . Smoking status: Never Smoker   . Smokeless tobacco: Never Used  . Alcohol Use: Yes     Comment: occasionally   OB History    Gravida Para Term Preterm AB TAB SAB  Ectopic Multiple Living   0              Review of Systems As in history of present illness Allergies  Shellfish allergy; Flagyl; Tramadol; Hydrocodone; and Promethazine hcl  Home Medications   Prior to Admission medications   Medication Sig Start Date End Date Taking? Authorizing Provider  esomeprazole (NEXIUM) 20 MG capsule Take 20 mg by mouth daily at 12 noon.   Yes Historical Provider, MD  albuterol (PROVENTIL HFA;VENTOLIN HFA) 108 (90 BASE) MCG/ACT inhaler Inhale 2 puffs into the lungs every 6 (six) hours as needed for wheezing or shortness of breath.    Historical Provider, MD  budesonide-formoterol (SYMBICORT) 80-4.5 MCG/ACT inhaler Inhale 2 puffs into the lungs daily.    Historical Provider, MD  folic acid (FOLVITE) 1 MG tablet Take 1 tablet (1 mg total) by mouth daily. 08/13/14   Shelly Bombard, MD  ibuprofen (ADVIL,MOTRIN) 800 MG tablet Take 1 tablet (800 mg total) by mouth every 8 (eight) hours as needed. 11/03/14   Melony Overly, MD  metroNIDAZOLE (METROGEL VAGINAL) 0.75 % vaginal gel Place 1 Applicatorful vaginally 2 (two) times daily. 08/13/14   Shelly Bombard, MD  Multiple Vitamin (MULTIVITAMIN WITH MINERALS) TABS tablet Take 1 tablet by mouth daily.    Historical Provider, MD  naproxen sodium (ANAPROX) 220 MG tablet Take 220 mg by mouth 2 (two) times daily with a meal.    Historical Provider, MD   BP 174/66 mmHg  Pulse 84  Temp(Src) 98  F (36.7 C) (Oral)  Resp 18  SpO2 100%  LMP 10/25/2014 Physical Exam  Constitutional: She is oriented to person, place, and time. She appears well-developed and well-nourished. No distress.  Cardiovascular: Normal rate.   Pulmonary/Chest: Effort normal.  Musculoskeletal:  Left foot: 2+ DP pulse. She has swelling and bruising over the lateral dorsal foot. She is quite tender along the dorsal lateral tarsal bones. Worse Refill in all digits. No malleoli or tenderness. No pain with ankle movement.  Neurological: She is alert and oriented  to person, place, and time.    ED Course  Procedures (including critical care time) Labs Review Labs Reviewed - No data to display  Imaging Review Dg Foot Complete Left  11/03/2014   CLINICAL DATA:  Twisted left foot today, pain and swelling  EXAM: LEFT FOOT - COMPLETE 3+ VIEW  COMPARISON:  None.  FINDINGS: Three views of left foot submitted. No acute fracture or subluxation. No radiopaque foreign body.  IMPRESSION: Negative.   Electronically Signed   By: Lahoma Crocker M.D.   On: 11/03/2014 20:22     MDM   1. Foot sprain, left, initial encounter    Conservative management with rest, ice, ibuprofen. Ace wrap given to use when she returns to weightbearing in 2-3 days. Crutches given.    Melony Overly, MD 11/03/14 [phone]

## 2014-11-03 NOTE — Discharge Instructions (Signed)
You have a foot sprain. Keep your foot elevated and apply ice as often as you can. Use Tylenol and ibuprofen as needed for pain. Use the crutches for the next 2 days. Starting on Thursday or Friday, gradually return to weightbearing on the foot.  When you start bearing weight on the foot, please wrap it with an Ace wrap. Follow-up as needed.

## 2014-11-10 ENCOUNTER — Emergency Department (HOSPITAL_COMMUNITY): Payer: PRIVATE HEALTH INSURANCE

## 2014-11-10 ENCOUNTER — Encounter (HOSPITAL_COMMUNITY): Payer: Self-pay | Admitting: Emergency Medicine

## 2014-11-10 ENCOUNTER — Emergency Department (HOSPITAL_COMMUNITY)
Admission: EM | Admit: 2014-11-10 | Discharge: 2014-11-10 | Disposition: A | Discharge status: Home / Self Care | Payer: PRIVATE HEALTH INSURANCE | Attending: Emergency Medicine | Admitting: Emergency Medicine

## 2014-11-10 DIAGNOSIS — Z791 Long term (current) use of non-steroidal anti-inflammatories (NSAID): Secondary | ICD-10-CM | POA: Diagnosis not present

## 2014-11-10 DIAGNOSIS — S93602A Unspecified sprain of left foot, initial encounter: Secondary | ICD-10-CM | POA: Diagnosis not present

## 2014-11-10 DIAGNOSIS — Y9289 Other specified places as the place of occurrence of the external cause: Secondary | ICD-10-CM | POA: Insufficient documentation

## 2014-11-10 DIAGNOSIS — Z792 Long term (current) use of antibiotics: Secondary | ICD-10-CM | POA: Insufficient documentation

## 2014-11-10 DIAGNOSIS — Z7951 Long term (current) use of inhaled steroids: Secondary | ICD-10-CM | POA: Insufficient documentation

## 2014-11-10 DIAGNOSIS — Z79899 Other long term (current) drug therapy: Secondary | ICD-10-CM | POA: Insufficient documentation

## 2014-11-10 DIAGNOSIS — X58XXXA Exposure to other specified factors, initial encounter: Secondary | ICD-10-CM | POA: Insufficient documentation

## 2014-11-10 DIAGNOSIS — Y9301 Activity, walking, marching and hiking: Secondary | ICD-10-CM | POA: Diagnosis not present

## 2014-11-10 DIAGNOSIS — S99922A Unspecified injury of left foot, initial encounter: Secondary | ICD-10-CM | POA: Diagnosis present

## 2014-11-10 DIAGNOSIS — Y998 Other external cause status: Secondary | ICD-10-CM | POA: Insufficient documentation

## 2014-11-10 DIAGNOSIS — I10 Essential (primary) hypertension: Secondary | ICD-10-CM | POA: Diagnosis not present

## 2014-11-10 NOTE — Discharge Instructions (Signed)
Use crutches. Keep foot elevated while at home. Ice several times a day. Please follow up with orthopedics as scheduled.    Foot Sprain The muscles and cord like structures which attach muscle to bone (tendons) that surround the feet are made up of units. A foot sprain can occur at the weakest spot in any of these units. This condition is most often caused by injury to or overuse of the foot, as from playing contact sports, or aggravating a previous injury, or from poor conditioning, or obesity. SYMPTOMS  Pain with movement of the foot.  Tenderness and swelling at the injury site.  Loss of strength is present in moderate or severe sprains. THE THREE GRADES OR SEVERITY OF FOOT SPRAIN ARE:  Mild (Grade I): Slightly pulled muscle without tearing of muscle or tendon fibers or loss of strength.  Moderate (Grade II): Tearing of fibers in a muscle, tendon, or at the attachment to bone, with small decrease in strength.  Severe (Grade III): Rupture of the muscle-tendon-bone attachment, with separation of fibers. Severe sprain requires surgical repair. Often repeating (chronic) sprains are caused by overuse. Sudden (acute) sprains are caused by direct injury or over-use. DIAGNOSIS  Diagnosis of this condition is usually by your own observation. If problems continue, a caregiver may be required for further evaluation and treatment. X-rays may be required to make sure there are not breaks in the bones (fractures) present. Continued problems may require physical therapy for treatment. PREVENTION  Use strength and conditioning exercises appropriate for your sport.  Warm up properly prior to working out.  Use athletic shoes that are made for the sport you are participating in.  Allow adequate time for healing. Early return to activities makes repeat injury more likely, and can lead to an unstable arthritic foot that can result in prolonged disability. Mild sprains generally heal in 3 to 10 days, with  moderate and severe sprains taking 2 to 10 weeks. Your caregiver can help you determine the proper time required for healing. HOME CARE INSTRUCTIONS   Apply ice to the injury for 15-20 minutes, 03-04 times per day. Put the ice in a plastic bag and place a towel between the bag of ice and your skin.  An elastic wrap (like an Ace bandage) may be used to keep swelling down.  Keep foot above the level of the heart, or at least raised on a footstool, when swelling and pain are present.  Try to avoid use other than gentle range of motion while the foot is painful. Do not resume use until instructed by your caregiver. Then begin use gradually, not increasing use to the point of pain. If pain does develop, decrease use and continue the above measures, gradually increasing activities that do not cause discomfort, until you gradually achieve normal use.  Use crutches if and as instructed, and for the length of time instructed.  Keep injured foot and ankle wrapped between treatments.  Massage foot and ankle for comfort and to keep swelling down. Massage from the toes up towards the knee.  Only take over-the-counter or prescription medicines for pain, discomfort, or fever as directed by your caregiver. SEEK IMMEDIATE MEDICAL CARE IF:   Your pain and swelling increase, or pain is not controlled with medications.  You have loss of feeling in your foot or your foot turns cold or blue.  You develop new, unexplained symptoms, or an increase of the symptoms that brought you to your caregiver. MAKE SURE YOU:   Understand these  instructions.  Will watch your condition.  Will get help right away if you are not doing well or get worse. Document Released: 09/02/2001 Document Revised: 06/05/2011 Document Reviewed: 10/31/2007 [facility] Patient Information 2015 [place], [place]. This information is not intended to replace advice given to you by your health care provider. Make sure you discuss any questions you  have with your health care provider.

## 2014-11-10 NOTE — ED Notes (Signed)
Pt reports increased pain and swelling in l/foot since injury one week ago. Took Motrin 800 for pain each day.

## 2014-11-10 NOTE — ED Provider Notes (Signed)
CSN: [id number]     Arrival date & time 11/10/14  1133 History  This chart was scribed for non-physician practitioner, Jeannett Senior, PA-C, working with Virgel Manifold, MD by Terressa Koyanagi, ED Scribe. This patient was seen in room WTR8/WTR8 and the patient's care was started at 12:15 PM.   Chief Complaint  Patient presents with  . Foot Pain    pain in l/foot x 7 days   The history is provided by the patient. No language interpreter was used.   PCP: Reginia Naas, MD HPI Comments: JADENE George is a 39 y.o. female, with PMHx noted below, who presents to the Emergency Department complaining of traumatic, sudden onset, aching/throbbing, 8/10, worsening left foot pain onset one week ago after pt rolled her left foot while walking. Pt reports taking ibuprofen, elevating and icing the affected area, and applying ace bandage without relief.   Pt was treated for the same at the ED on 11/03/14 whereby imaging was completed which showed no acute fracture or subluxation. Pt was discharged home with instructions to rest, ice the affected area, take ibuprofen as needed; pt was also provided crutches and instructed to ace wrap and avoid bearing weight on the foot for 2-3 days.   Past Medical History  Diagnosis Date  . Hypertension   . Sciatica    Past Surgical History  Procedure Laterality Date  . Esophagogastroduodenoscopy  01/30/2011    Procedure: ESOPHAGOGASTRODUODENOSCOPY (EGD);  Surgeon: Zenovia Jarred, MD;  Location: [facility];  Service: Gastroenterology;  Laterality: N/A;  . Diagnostic laparoscopy    . Cholecystectomy  02/01/2011    Procedure: LAPAROSCOPIC CHOLECYSTECTOMY WITH INTRAOPERATIVE CHOLANGIOGRAM;  Surgeon: Odis Hollingshead, MD;  Location: [facility];  Service: General;  Laterality: N/A;  Needs IOC and C-arm  . Esophagogastroduodenoscopy  03/03/2011    Procedure: ESOPHAGOGASTRODUODENOSCOPY (EGD);  Surgeon: Zenovia Jarred, MD;  Location: [facility];  Service: Gastroenterology;   Laterality: N/A;   Family History  Problem Relation Age of Onset  . Hypertension Mother   . Anesthesia problems Neg Hx   . Malignant hyperthermia Neg Hx   . Pseudochol deficiency Neg Hx   . Colon cancer Maternal Grandmother   . Diabetes Maternal Grandmother   . Kidney disease Maternal Grandmother   . Diabetes Brother   . Diabetes Paternal Grandmother   . Diverticulosis Paternal Grandmother    Social History  Substance Use Topics  . Smoking status: Never Smoker   . Smokeless tobacco: Never Used  . Alcohol Use: Yes     Comment: occasionally   OB History    Gravida Para Term Preterm AB TAB SAB Ectopic Multiple Living   0              Review of Systems  Constitutional: Negative for fever and chills.  Musculoskeletal: Positive for joint swelling (swelling of left foot) and arthralgias (left foot pain).   Allergies  Shellfish allergy; Flagyl; Tramadol; Hydrocodone; and Promethazine hcl  Home Medications   Prior to Admission medications   Medication Sig Start Date End Date Taking? Authorizing Provider  albuterol (PROVENTIL HFA;VENTOLIN HFA) 108 (90 BASE) MCG/ACT inhaler Inhale 2 puffs into the lungs every 6 (six) hours as needed for wheezing or shortness of breath.    Historical Provider, MD  budesonide-formoterol (SYMBICORT) 80-4.5 MCG/ACT inhaler Inhale 2 puffs into the lungs daily.    Historical Provider, MD  esomeprazole (NEXIUM) 20 MG capsule Take 20 mg by mouth daily at 12 noon.    Historical Provider, MD  folic acid (FOLVITE) 1 MG tablet Take 1 tablet (1 mg total) by mouth daily. 08/13/14   Shelly Bombard, MD  ibuprofen (ADVIL,MOTRIN) 800 MG tablet Take 1 tablet (800 mg total) by mouth every 8 (eight) hours as needed. 11/03/14   Melony Overly, MD  metroNIDAZOLE (METROGEL VAGINAL) 0.75 % vaginal gel Place 1 Applicatorful vaginally 2 (two) times daily. 08/13/14   Shelly Bombard, MD  Multiple Vitamin (MULTIVITAMIN WITH MINERALS) TABS tablet Take 1 tablet by mouth daily.     Historical Provider, MD  naproxen sodium (ANAPROX) 220 MG tablet Take 220 mg by mouth 2 (two) times daily with a meal.    Historical Provider, MD   Triage Vitals: BP 134/88 mmHg  Pulse 98  Temp(Src) 97.7 F (36.5 C) (Oral)  Resp 16  SpO2 100%  LMP 10/25/2014 Physical Exam  Constitutional: She is oriented to person, place, and time. She appears well-developed and well-nourished.  HENT:  Head: Normocephalic.  Eyes: EOM are normal.  Neck: Normal range of motion.  Pulmonary/Chest: Effort normal.  Abdominal: She exhibits no distension.  Musculoskeletal: Normal range of motion.  normal-appearing left foot. Tender to palpation over fifth metatarsal. Full range of motion of all toes. Normal ankle and no tenderness or medial lateral malleolus. Normal heel. Achilles tendon is intact.No obvious swelling or deformity. No bruising.t oes are pink, warm. Cap refill less than 2 seconds distally.  Neurological: She is alert and oriented to person, place, and time.  Skin: Skin is warm and dry.  Psychiatric: She has a normal mood and affect.  Nursing note and vitals reviewed.   ED Course  Procedures (including critical care time) DIAGNOSTIC STUDIES: Oxygen Saturation is 100% on RA, nl by my interpretation.    COORDINATION OF CARE: 12:18 PM: Discussed treatment plan which includes imaging with pt at bedside; patient verbalizes understanding and agrees with treatment plan.  Imaging Review No results found. I have personally reviewed and evaluated these images and lab results as part of my medical decision-making.    MDM   Final diagnoses:  Foot sprain, left, initial encounter   Patient with left foot pain after injury at week ago.Had negative x-ray that time. [name] re-x-rayed to rule out Occult fracture.   X-rays negative.Discharge home with Ace wrap, crutches, follow-up with orthopedics. She has an appointment 2 days.  Filed Vitals:   11/10/14 1145  BP: 134/88  Pulse: 98  Temp: 97.7  F (36.5 C)  TempSrc: Oral  Resp: 16  SpO2: 100%   I personally performed the services described in this documentation, which was scribed in my presence. The recorded information has been reviewed and is accurate.   Jeannett Senior, PA-C 11/10/14 1404  Virgel Manifold, MD 11/13/14 [phone]

## 2015-05-12 ENCOUNTER — Emergency Department (INDEPENDENT_AMBULATORY_CARE_PROVIDER_SITE_OTHER)
Admission: EM | Admit: 2015-05-12 | Discharge: 2015-05-12 | Disposition: A | Discharge status: Home / Self Care | Payer: PRIVATE HEALTH INSURANCE | Source: Home / Self Care | Attending: Family Medicine | Admitting: Family Medicine

## 2015-05-12 ENCOUNTER — Encounter (HOSPITAL_COMMUNITY): Payer: Self-pay | Admitting: Emergency Medicine

## 2015-05-12 DIAGNOSIS — J111 Influenza due to unidentified influenza virus with other respiratory manifestations: Secondary | ICD-10-CM

## 2015-05-12 MED ORDER — OSELTAMIVIR PHOSPHATE 75 MG PO CAPS: 75.0000 mg | ORAL_CAPSULE | Freq: Two times a day (BID) | ORAL | Status: DC

## 2015-05-12 NOTE — Discharge Instructions (Signed)

## 2015-05-12 NOTE — ED Provider Notes (Signed)
CSN: [id number]     Arrival date & time 05/12/15  1736 History   First MD Initiated Contact with Patient 05/12/15 1909     Chief Complaint  Patient presents with  . Shortness of Breath  . Neck Pain  . Generalized Body Aches   (Consider location/radiation/quality/duration/timing/severity/associated sxs/prior Treatment) HPI shortness of breath or pain body aches symptoms started yesterday. Patient states that she was in [place] when her symptoms started and just slowly get worse over the past 12 hours or so. She is also had fever as high as 103 which has been reduced with Tylenol. She denies given a flu shot at this time. She denies nausea vomiting or diarrhea.  Past Medical History  Diagnosis Date  . Hypertension   . Sciatica    Past Surgical History  Procedure Laterality Date  . Esophagogastroduodenoscopy  01/30/2011    Procedure: ESOPHAGOGASTRODUODENOSCOPY (EGD);  Surgeon: Zenovia Jarred, MD;  Location: [facility];  Service: Gastroenterology;  Laterality: N/A;  . Diagnostic laparoscopy    . Cholecystectomy  02/01/2011    Procedure: LAPAROSCOPIC CHOLECYSTECTOMY WITH INTRAOPERATIVE CHOLANGIOGRAM;  Surgeon: Odis Hollingshead, MD;  Location: [facility];  Service: General;  Laterality: N/A;  Needs IOC and C-arm  . Esophagogastroduodenoscopy  03/03/2011    Procedure: ESOPHAGOGASTRODUODENOSCOPY (EGD);  Surgeon: Zenovia Jarred, MD;  Location: [facility];  Service: Gastroenterology;  Laterality: N/A;   Family History  Problem Relation Age of Onset  . Hypertension Mother   . Anesthesia problems Neg Hx   . Malignant hyperthermia Neg Hx   . Pseudochol deficiency Neg Hx   . Colon cancer Maternal Grandmother   . Diabetes Maternal Grandmother   . Kidney disease Maternal Grandmother   . Diabetes Brother   . Diabetes Paternal Grandmother   . Diverticulosis Paternal Grandmother    Social History  Substance Use Topics  . Smoking status: Never Smoker   . Smokeless tobacco: Never Used  . Alcohol Use:  Yes     Comment: occasionally   OB History    Gravida Para Term Preterm AB TAB SAB Ectopic Multiple Living   0              Review of Systems See history of present illness  Allergies  Shellfish allergy; Flagyl; Tramadol; Hydrocodone; and Promethazine hcl  Home Medications   Prior to Admission medications   Medication Sig Start Date End Date Taking? Authorizing Provider  budesonide-formoterol (SYMBICORT) 80-4.5 MCG/ACT inhaler Inhale 2 puffs into the lungs daily.   Yes Historical Provider, MD  esomeprazole (NEXIUM) 20 MG capsule Take 20 mg by mouth daily at 12 noon.   Yes Historical Provider, MD  albuterol (PROVENTIL HFA;VENTOLIN HFA) 108 (90 BASE) MCG/ACT inhaler Inhale 2 puffs into the lungs every 6 (six) hours as needed for wheezing or shortness of breath.    Historical Provider, MD  folic acid (FOLVITE) 1 MG tablet Take 1 tablet (1 mg total) by mouth daily. 08/13/14   Shelly Bombard, MD  ibuprofen (ADVIL,MOTRIN) 800 MG tablet Take 1 tablet (800 mg total) by mouth every 8 (eight) hours as needed. 11/03/14   Melony Overly, MD  metroNIDAZOLE (METROGEL VAGINAL) 0.75 % vaginal gel Place 1 Applicatorful vaginally 2 (two) times daily. 08/13/14   Shelly Bombard, MD  Multiple Vitamin (MULTIVITAMIN WITH MINERALS) TABS tablet Take 1 tablet by mouth daily.    Historical Provider, MD  naproxen sodium (ANAPROX) 220 MG tablet Take 220 mg by mouth 2 (two) times daily with a meal.  Historical Provider, MD  oseltamivir (TAMIFLU) 75 MG capsule Take 1 capsule (75 mg total) by mouth every 12 (twelve) hours. 05/12/15   Konrad Felix, PA   Meds Ordered and Administered this Visit  Medications - No data to display  BP 148/100 mmHg  Pulse 97  Temp(Src) 99.1 F (37.3 C) (Oral)  Resp 24  SpO2 100%  LMP 05/12/2015 (Exact Date) No data found.   Physical Exam NURSES NOTES AND VITAL SIGNS REVIEWED. CONSTITUTIONAL: Well developed, well nourished, no acute distress HEENT: normocephalic, atraumatic,  right and left TM's are normal EYES: Conjunctiva normal NECK:normal ROM, supple, no adenopathy PULMONARY:No respiratory distress, normal effort, Lungs: CTAb/l, no wheezes, or increased work of breathing CARDIOVASCULAR: RRR, no murmur ABDOMEN: soft, ND, NT, +'ve BS MUSCULOSKELETAL: Normal ROM of all extremities,  SKIN: warm and dry without rash PSYCHIATRIC: Mood and affect, behavior are normal  ED Course  Procedures (including critical care time)  Labs Review Labs Reviewed - No data to display  Imaging Review No results found.   Visual Acuity Review  Right Eye Distance:   Left Eye Distance:   Bilateral Distance:    Right Eye Near:   Left Eye Near:    Bilateral Near:         MDM   1. Flu    Patient is advised to continue home symptomatic treatment. Prescription for Tamiflu sent pharmacy patient has indicated. Patient is advised that if there are new or worsening symptoms or attend the emergency department, or contact primary care provider. Instructions of care provided discharged home in stable condition. Return to work/school note provided.  THIS NOTE WAS GENERATED USING A VOICE RECOGNITION SOFTWARE PROGRAM. ALL REASONABLE EFFORTS  WERE MADE TO PROOFREAD THIS DOCUMENT FOR ACCURACY.     Konrad Felix, PA 05/12/15 2115

## 2015-05-12 NOTE — ED Notes (Signed)
Pt states she has been suffering from heaviness in her chest, chills, body aches, nausea, dizziness and neck pain since last night.  She used her Symbicort three times last night with no relief and again today.  She has taken Mucinex and Theraflu with no relief.

## 2015-06-15 ENCOUNTER — Emergency Department (HOSPITAL_COMMUNITY)
Admission: EM | Admit: 2015-06-15 | Discharge: 2015-06-15 | Disposition: A | Discharge status: Home / Self Care | Payer: PRIVATE HEALTH INSURANCE | Attending: Emergency Medicine | Admitting: Emergency Medicine

## 2015-06-15 ENCOUNTER — Encounter (HOSPITAL_COMMUNITY): Payer: Self-pay | Admitting: Emergency Medicine

## 2015-06-15 DIAGNOSIS — I10 Essential (primary) hypertension: Secondary | ICD-10-CM | POA: Insufficient documentation

## 2015-06-15 DIAGNOSIS — H9209 Otalgia, unspecified ear: Secondary | ICD-10-CM | POA: Diagnosis present

## 2015-06-15 NOTE — ED Notes (Signed)
Pt states that she has been having lt ear pain and swelling since Thursday with pain traveling to her shoulder. Pt states she tried to make an appt with [facility] and they told her to come to the ER.

## 2015-06-15 NOTE — ED Notes (Signed)
Writer called for room assignment no response

## 2015-08-18 ENCOUNTER — Other Ambulatory Visit: Payer: Self-pay | Admitting: Obstetrics

## 2015-08-18 DIAGNOSIS — N760 Acute vaginitis: Principal | ICD-10-CM

## 2015-08-18 DIAGNOSIS — B9689 Other specified bacterial agents as the cause of diseases classified elsewhere: Secondary | ICD-10-CM

## 2015-10-26 ENCOUNTER — Telehealth: Payer: Self-pay | Admitting: *Deleted

## 2015-10-26 NOTE — Telephone Encounter (Signed)
Patient would like a refill of Diflucan- she let her refills expire.

## 2015-10-27 ENCOUNTER — Other Ambulatory Visit: Payer: Self-pay | Admitting: Obstetrics

## 2015-10-27 MED ORDER — FLUCONAZOLE 150 MG PO TABS: 150.0000 mg | ORAL_TABLET | Freq: Once | ORAL | 2 refills | Status: AC

## 2015-10-27 NOTE — Telephone Encounter (Signed)
Diflucan Rx

## 2015-11-06 ENCOUNTER — Emergency Department
Admission: EM | Admit: 2015-11-06 | Discharge: 2015-11-06 | Disposition: A | Discharge status: Home / Self Care | Payer: PRIVATE HEALTH INSURANCE | Attending: Emergency Medicine | Admitting: Emergency Medicine

## 2015-11-06 ENCOUNTER — Emergency Department: Payer: PRIVATE HEALTH INSURANCE

## 2015-11-06 ENCOUNTER — Encounter: Payer: Self-pay | Admitting: Emergency Medicine

## 2015-11-06 DIAGNOSIS — X501XXA Overexertion from prolonged static or awkward postures, initial encounter: Secondary | ICD-10-CM | POA: Diagnosis not present

## 2015-11-06 DIAGNOSIS — S63616A Unspecified sprain of right little finger, initial encounter: Secondary | ICD-10-CM | POA: Diagnosis not present

## 2015-11-06 DIAGNOSIS — I10 Essential (primary) hypertension: Secondary | ICD-10-CM | POA: Diagnosis not present

## 2015-11-06 DIAGNOSIS — Y999 Unspecified external cause status: Secondary | ICD-10-CM | POA: Insufficient documentation

## 2015-11-06 DIAGNOSIS — S63619A Unspecified sprain of unspecified finger, initial encounter: Secondary | ICD-10-CM

## 2015-11-06 DIAGNOSIS — Y929 Unspecified place or not applicable: Secondary | ICD-10-CM | POA: Insufficient documentation

## 2015-11-06 DIAGNOSIS — Z7951 Long term (current) use of inhaled steroids: Secondary | ICD-10-CM | POA: Insufficient documentation

## 2015-11-06 DIAGNOSIS — M79644 Pain in right finger(s): Secondary | ICD-10-CM | POA: Diagnosis present

## 2015-11-06 DIAGNOSIS — Z79899 Other long term (current) drug therapy: Secondary | ICD-10-CM | POA: Insufficient documentation

## 2015-11-06 DIAGNOSIS — Y939 Activity, unspecified: Secondary | ICD-10-CM | POA: Diagnosis not present

## 2015-11-06 MED ORDER — NAPROXEN 500 MG PO TABS
500.0000 mg | ORAL_TABLET | Freq: Once | ORAL | Status: AC
  Administered 2015-11-06: 500 mg via ORAL
  Filled 2015-11-06: qty 1

## 2015-11-06 MED ORDER — NAPROXEN 500 MG PO TABS: 500.0000 mg | ORAL_TABLET | Freq: Two times a day (BID) | ORAL | 0 refills | Status: DC
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
## 2015-11-06 NOTE — ED Provider Notes (Signed)
[facility] Emergency Department Provider Note ____________________________________________  Time seen: Approximately 7:49 PM  I have reviewed the triage vital signs and the nursing notes.   HISTORY  Chief Complaint Finger Injury    HPI Victoria George is a 40 y.o. female who presents to the emergency department for evaluation of right pinky finger pain. She states that she was assisting her husband who had been injured into a car and she wasn't anticipating having to assist him as much as she did. She states that somehow her pinky finger got twisted. She denies other complaints. She has not taken anything for pain prior to arrival. She has no history of finger injury.  Past Medical History:  Diagnosis Date  . Hypertension   . Sciatica     Patient Active Problem List   Diagnosis Date Noted  . UTI (lower urinary tract infection) 02/18/2014  . Gastritis 03/04/2011  . Musculoskeletal pain 03/04/2011  . Nausea & vomiting 03/02/2011  . HTN (hypertension) 03/02/2011  . Hypokalemia 03/02/2011  . Anemia 03/02/2011  . Abdominal pain, epigastric 01/29/2011    Past Surgical History:  Procedure Laterality Date  . CHOLECYSTECTOMY  02/01/2011   Procedure: LAPAROSCOPIC CHOLECYSTECTOMY WITH INTRAOPERATIVE CHOLANGIOGRAM;  Surgeon: Odis Hollingshead, MD;  Location: [facility];  Service: General;  Laterality: N/A;  Needs IOC and C-arm  . DIAGNOSTIC LAPAROSCOPY    . ESOPHAGOGASTRODUODENOSCOPY  01/30/2011   Procedure: ESOPHAGOGASTRODUODENOSCOPY (EGD);  Surgeon: Zenovia Jarred, MD;  Location: [facility];  Service: Gastroenterology;  Laterality: N/A;  . ESOPHAGOGASTRODUODENOSCOPY  03/03/2011   Procedure: ESOPHAGOGASTRODUODENOSCOPY (EGD);  Surgeon: Zenovia Jarred, MD;  Location: [facility];  Service: Gastroenterology;  Laterality: N/A;    Prior to Admission medications   Medication Sig Start Date End Date Taking? Authorizing Provider  albuterol (PROVENTIL HFA;VENTOLIN HFA) 108 (90  BASE) MCG/ACT inhaler Inhale 2 puffs into the lungs every 6 (six) hours as needed for wheezing or shortness of breath.    Historical Provider, MD  budesonide-formoterol (SYMBICORT) 80-4.5 MCG/ACT inhaler Inhale 2 puffs into the lungs daily.    Historical Provider, MD  esomeprazole (NEXIUM) 20 MG capsule Take 20 mg by mouth daily at 12 noon.    Historical Provider, MD  folic acid (FOLVITE) 1 MG tablet Take 1 tablet (1 mg total) by mouth daily. 08/13/14   Shelly Bombard, MD  metroNIDAZOLE (METROGEL) 0.75 % vaginal gel INSERT ONE APPLICATORFUL VAGINALLY TWICE DAILY 08/19/15   Shelly Bombard, MD  Multiple Vitamin (MULTIVITAMIN WITH MINERALS) TABS tablet Take 1 tablet by mouth daily.    Historical Provider, MD  naproxen (NAPROSYN) 500 MG tablet Take 1 tablet (500 mg total) by mouth 2 (two) times daily with a meal. 11/06/15   Deaisha Welborn B Garvin Ellena, FNP  naproxen sodium (ANAPROX) 220 MG tablet Take 220 mg by mouth 2 (two) times daily with a meal.    Historical Provider, MD  oseltamivir (TAMIFLU) 75 MG capsule Take 1 capsule (75 mg total) by mouth every 12 (twelve) hours. 05/12/15   Konrad Felix, PA    Allergies Shellfish allergy; Flagyl [metronidazole]; Tramadol; Hydrocodone; and Promethazine hcl  Family History  Problem Relation Age of Onset  . Hypertension Mother   . Colon cancer Maternal Grandmother   . Diabetes Maternal Grandmother   . Kidney disease Maternal Grandmother   . Diabetes Brother   . Diabetes Paternal Grandmother   . Diverticulosis Paternal Grandmother   . Anesthesia problems Neg Hx   . Malignant hyperthermia Neg Hx   .  Pseudochol deficiency Neg Hx     Social History Social History  Substance Use Topics  . Smoking status: Never Smoker  . Smokeless tobacco: Never Used  . Alcohol use Yes     Comment: occasionally    Review of Systems Constitutional: No recent illness. Cardiovascular: Denies chest pain or palpitations. Respiratory: Denies shortness of  breath. Musculoskeletal: Pain in Right fifth digit. Skin: Negative for rash, wound, lesion. Neurological: Negative for focal weakness or numbness.  ____________________________________________   PHYSICAL EXAM:  VITAL SIGNS: ED Triage Vitals [11/06/15 1924]  Enc Vitals Group     BP (!) 153/104     Pulse Rate (!) 105     Resp 16     Temp 98.4 F (36.9 C)     Temp Source Oral     SpO2 98 %     Weight 185 lb (83.9 kg)     Height 5' 1"  (1.549 m)     Head Circumference      Peak Flow      Pain Score 4     Pain Loc      Pain Edu?      Excl. in [place]?     Constitutional: Alert and oriented. Well appearing and in no acute distress. Eyes: Conjunctivae are normal. EOMI. Head: Atraumatic. Neck: No stridor.  Respiratory: Normal respiratory effort.   Musculoskeletal: Right hand, fifth finger tender at the MCP without deformity. Neurologic:  Normal speech and language. No gross focal neurologic deficits are appreciated. Speech is normal. No gait instability. Skin:  Skin is warm, dry and intact. Atraumatic. No contusion or swelling noted specifically to the fifth digit of the right hand. Psychiatric: Mood and affect are normal. Speech and behavior are normal.  ____________________________________________   LABS (all labs ordered are listed, but only abnormal results are displayed)  Labs Reviewed - No data to display ____________________________________________  RADIOLOGY  No acute bony abnormality of the right fifth digit per radiology. ____________________________________________   PROCEDURES  Procedure(s) performed: None   ____________________________________________   INITIAL IMPRESSION / ASSESSMENT AND PLAN / ED COURSE  Pertinent labs & imaging results that were available during my care of the patient were reviewed by me and considered in my medical decision making (see chart for details).  Patient was instructed to follow up with her primary care provider for  symptoms that are not improving over the week. She was given a prescription for Naprosyn and advised to take it as prescribed. She was encouraged to return to the emergency department for symptoms that change or worsen if she is unable to schedule an appointment with her primary care provider. ____________________________________________   FINAL CLINICAL IMPRESSION(S) / ED DIAGNOSES  Final diagnoses:  Finger sprain, initial encounter       Victorino Dike, FNP 11/06/15 2036    Earleen Newport, MD 11/06/15 2258

## 2015-11-18 ENCOUNTER — Ambulatory Visit (INDEPENDENT_AMBULATORY_CARE_PROVIDER_SITE_OTHER): Payer: PRIVATE HEALTH INSURANCE | Admitting: Obstetrics

## 2015-11-18 ENCOUNTER — Encounter: Payer: Self-pay | Admitting: Obstetrics

## 2015-11-18 VITALS — BP 166/106 | HR 92 | Ht 61.0 in | Wt 213.3 lb

## 2015-11-18 DIAGNOSIS — I10 Essential (primary) hypertension: Secondary | ICD-10-CM | POA: Diagnosis not present

## 2015-11-18 DIAGNOSIS — B373 Candidiasis of vulva and vagina: Secondary | ICD-10-CM | POA: Diagnosis not present

## 2015-11-18 DIAGNOSIS — B3731 Acute candidiasis of vulva and vagina: Secondary | ICD-10-CM

## 2015-11-18 MED ORDER — TRIAMTERENE-HCTZ 37.5-25 MG PO CAPS: 1.0000 | ORAL_CAPSULE | Freq: Every day | ORAL | 11 refills | Status: DC

## 2015-11-18 MED ORDER — CARVEDILOL 12.5 MG PO TABS: 12.5000 mg | ORAL_TABLET | Freq: Two times a day (BID) | ORAL | 11 refills | Status: DC

## 2015-11-18 MED ORDER — FLUCONAZOLE 200 MG PO TABS: 200.0000 mg | ORAL_TABLET | ORAL | 2 refills | Status: DC

## 2015-11-18 NOTE — Progress Notes (Signed)
Patient ID: Victoria George, female   DOB: 02/05/1976, 40 y.o.   MRN: [id number]  Chief Complaint  Patient presents with  . Groin Swelling    Pt c/o vaginal swelling and irritation x 2 days.  . Vaginal Discharge    clear-foul odor    HPI Victoria George is a 40 y.o. female.  Vaginal swelling, irritation and odor. HPI  Past Medical History:  Diagnosis Date  . Hypertension   . Sciatica     Past Surgical History:  Procedure Laterality Date  . CHOLECYSTECTOMY  02/01/2011   Procedure: LAPAROSCOPIC CHOLECYSTECTOMY WITH INTRAOPERATIVE CHOLANGIOGRAM;  Surgeon: Odis Hollingshead, MD;  Location: [facility];  Service: General;  Laterality: N/A;  Needs IOC and C-arm  . DIAGNOSTIC LAPAROSCOPY    . ESOPHAGOGASTRODUODENOSCOPY  01/30/2011   Procedure: ESOPHAGOGASTRODUODENOSCOPY (EGD);  Surgeon: Zenovia Jarred, MD;  Location: [facility];  Service: Gastroenterology;  Laterality: N/A;  . ESOPHAGOGASTRODUODENOSCOPY  03/03/2011   Procedure: ESOPHAGOGASTRODUODENOSCOPY (EGD);  Surgeon: Zenovia Jarred, MD;  Location: [facility];  Service: Gastroenterology;  Laterality: N/A;    Family History  Problem Relation Age of Onset  . Hypertension Mother   . Colon cancer Maternal Grandmother   . Diabetes Maternal Grandmother   . Kidney disease Maternal Grandmother   . Diabetes Brother   . Diabetes Paternal Grandmother   . Diverticulosis Paternal Grandmother   . Anesthesia problems Neg Hx   . Malignant hyperthermia Neg Hx   . Pseudochol deficiency Neg Hx     Social History Social History  Substance Use Topics  . Smoking status: Never Smoker  . Smokeless tobacco: Never Used  . Alcohol use Yes     Comment: occasionally    Allergies  Allergen Reactions  . Shellfish Allergy Anaphylaxis  . Flagyl [Metronidazole] Nausea And Vomiting  . Tramadol Other (See Comments)    Racing thoughts, Insomnia and SI.  Marland Kitchen Hydrocodone Rash  . Promethazine Hcl Nausea And Vomiting and Palpitations    Current Outpatient  Prescriptions  Medication Sig Dispense Refill  . albuterol (PROVENTIL HFA;VENTOLIN HFA) 108 (90 BASE) MCG/ACT inhaler Inhale 2 puffs into the lungs every 6 (six) hours as needed for wheezing or shortness of breath.    . budesonide-formoterol (SYMBICORT) 80-4.5 MCG/ACT inhaler Inhale 2 puffs into the lungs daily.    . carvedilol (COREG) 12.5 MG tablet Take 1 tablet (12.5 mg total) by mouth 2 (two) times daily with a meal. 60 tablet 11  . esomeprazole (NEXIUM) 20 MG capsule Take 20 mg by mouth daily at 12 noon.    . fluconazole (DIFLUCAN) 200 MG tablet Take 1 tablet (200 mg total) by mouth every other day. 3 tablet 2  . folic acid (FOLVITE) 1 MG tablet Take 1 tablet (1 mg total) by mouth daily. 90 tablet 3  . Multiple Vitamin (MULTIVITAMIN WITH MINERALS) TABS tablet Take 1 tablet by mouth daily.    . naproxen sodium (ANAPROX) 220 MG tablet Take 220 mg by mouth 2 (two) times daily with a meal.    . triamterene-hydrochlorothiazide (DYAZIDE) 37.5-25 MG capsule Take 1 each (1 capsule total) by mouth daily after breakfast. 30 capsule 11   No current facility-administered medications for this visit.     Review of Systems Review of Systems Constitutional: negative for fatigue and weight loss Respiratory: negative for cough and wheezing Cardiovascular: negative for chest pain, fatigue and palpitations Gastrointestinal: negative for abdominal pain and change in bowel habits Genitourinary:positive for vaginal swelling, irritation and odor Integument/breast: negative for nipple  discharge Musculoskeletal:negative for myalgias Neurological: negative for gait problems and tremors Behavioral/Psych: negative for abusive relationship, depression Endocrine: negative for temperature intolerance     Blood pressure (!) 166/106, pulse 92, height 5' 1"  (1.549 m), weight 213 lb 4.8 oz (96.8 kg), last menstrual period 10/28/2015.  Physical Exam Physical Exam           General:  Alert and no distress Abdomen:   normal findings: no organomegaly, soft, non-tender and no hernia  Pelvis:  External genitalia: normal general appearance Urinary system: urethral meatus normal and bladder without fullness, nontender Vaginal: tender and erythematous at introitus.  Cottage cheese - discharge in posterior vault and along walls Cervix: normal appearance Adnexa: normal bimanual exam Uterus: anteverted and non-tender, normal size      Data Reviewed Labs  Radiology  Assessment     Candida vaginitis R/O BV Hypertension    Plan    Wet prep sent  Dyazide Rx Coreg  F/U prn F/U with PCP for HTN in 2-3 months    No orders of the defined types were placed in this encounter.  Meds ordered this encounter  Medications  . fluconazole (DIFLUCAN) 200 MG tablet    Sig: Take 1 tablet (200 mg total) by mouth every other day.    Dispense:  3 tablet    Refill:  2  . triamterene-hydrochlorothiazide (DYAZIDE) 37.5-25 MG capsule    Sig: Take 1 each (1 capsule total) by mouth daily after breakfast.    Dispense:  30 capsule    Refill:  11  . carvedilol (COREG) 12.5 MG tablet    Sig: Take 1 tablet (12.5 mg total) by mouth 2 (two) times daily with a meal.    Dispense:  60 tablet    Refill:  11

## 2015-11-25 ENCOUNTER — Other Ambulatory Visit: Payer: Self-pay | Admitting: Obstetrics

## 2015-11-25 DIAGNOSIS — B3731 Acute candidiasis of vulva and vagina: Secondary | ICD-10-CM

## 2015-11-25 DIAGNOSIS — B373 Candidiasis of vulva and vagina: Secondary | ICD-10-CM

## 2015-11-25 LAB — NUSWAB VG+, CANDIDA 6SP
CANDIDA KRUSEI, NAA: NEGATIVE
Candida albicans, NAA: POSITIVE — AB
Candida glabrata, NAA: NEGATIVE
Candida lusitaniae, NAA: NEGATIVE
Candida parapsilosis, NAA: NEGATIVE
Candida tropicalis, NAA: NEGATIVE
Chlamydia trachomatis, NAA: NEGATIVE
Neisseria gonorrhoeae, NAA: NEGATIVE
TRICH VAG BY NAA: NEGATIVE

## 2015-12-04 ENCOUNTER — Emergency Department
Admission: EM | Admit: 2015-12-04 | Discharge: 2015-12-04 | Disposition: A | Discharge status: Home / Self Care | Payer: PRIVATE HEALTH INSURANCE | Attending: Student | Admitting: Student

## 2015-12-04 ENCOUNTER — Encounter: Payer: Self-pay | Admitting: Emergency Medicine

## 2015-12-04 DIAGNOSIS — S0990XA Unspecified injury of head, initial encounter: Secondary | ICD-10-CM | POA: Diagnosis present

## 2015-12-04 DIAGNOSIS — W01198A Fall on same level from slipping, tripping and stumbling with subsequent striking against other object, initial encounter: Secondary | ICD-10-CM | POA: Insufficient documentation

## 2015-12-04 DIAGNOSIS — Y999 Unspecified external cause status: Secondary | ICD-10-CM | POA: Insufficient documentation

## 2015-12-04 DIAGNOSIS — Y92000 Kitchen of unspecified non-institutional (private) residence as  the place of occurrence of the external cause: Secondary | ICD-10-CM | POA: Diagnosis not present

## 2015-12-04 DIAGNOSIS — S060X0A Concussion without loss of consciousness, initial encounter: Secondary | ICD-10-CM | POA: Insufficient documentation

## 2015-12-04 DIAGNOSIS — Y9389 Activity, other specified: Secondary | ICD-10-CM | POA: Diagnosis not present

## 2015-12-04 DIAGNOSIS — I10 Essential (primary) hypertension: Secondary | ICD-10-CM | POA: Diagnosis not present

## 2015-12-04 NOTE — ED Provider Notes (Signed)
[facility] Emergency Department Provider Note   ____________________________________________   First MD Initiated Contact with Patient 12/04/15 [phone]     (approximate)  I have reviewed the triage vital signs and the nursing notes.   HISTORY  Chief Complaint Head Injury    HPI Victoria George is a 40 y.o. female with history of hypertension, not chronically anticoagulated who presents for evaluation of 2 days of mild to moderate headache which began after a slip and fall in her kitchen from standing height on 12/02/15, gradual onset, currently mild, no modifying factors. Patient reports that she was unaware that she had spilled something in her kitchen and there was a puddle on the floor. She slipped in the puddle and fell backwards, hitting her head on the ground. She does not think she lost consciousness but thinks maybe that she was dazed for a short amount of time. She reports that since the fall she has had headache, intermittent "blurred" vision in both eyes as well as some dizziness which is triggered by position change. She denies any vomiting, she denies any neck pain, she denies any numbness or weakness in the arms or legs. No chest pain or difficulty breathing, no recent illness including no fevers, chills, diarrhea. He denies any alcohol use, no drug use. She has not taken any medications at home for treatment of headache. She denies any severe headache.   Past Medical History:  Diagnosis Date  . Hypertension   . Sciatica     Patient Active Problem List   Diagnosis Date Noted  . UTI (lower urinary tract infection) 02/18/2014  . Gastritis 03/04/2011  . Musculoskeletal pain 03/04/2011  . Nausea & vomiting 03/02/2011  . HTN (hypertension) 03/02/2011  . Hypokalemia 03/02/2011  . Anemia 03/02/2011  . Abdominal pain, epigastric 01/29/2011    Past Surgical History:  Procedure Laterality Date  . CHOLECYSTECTOMY  02/01/2011   Procedure: LAPAROSCOPIC  CHOLECYSTECTOMY WITH INTRAOPERATIVE CHOLANGIOGRAM;  Surgeon: Odis Hollingshead, MD;  Location: [facility];  Service: General;  Laterality: N/A;  Needs IOC and C-arm  . DIAGNOSTIC LAPAROSCOPY    . ESOPHAGOGASTRODUODENOSCOPY  01/30/2011   Procedure: ESOPHAGOGASTRODUODENOSCOPY (EGD);  Surgeon: Zenovia Jarred, MD;  Location: [facility];  Service: Gastroenterology;  Laterality: N/A;  . ESOPHAGOGASTRODUODENOSCOPY  03/03/2011   Procedure: ESOPHAGOGASTRODUODENOSCOPY (EGD);  Surgeon: Zenovia Jarred, MD;  Location: [facility];  Service: Gastroenterology;  Laterality: N/A;    Prior to Admission medications   Medication Sig Start Date End Date Taking? Authorizing Provider  albuterol (PROVENTIL HFA;VENTOLIN HFA) 108 (90 BASE) MCG/ACT inhaler Inhale 2 puffs into the lungs every 6 (six) hours as needed for wheezing or shortness of breath.    Historical Provider, MD  budesonide-formoterol (SYMBICORT) 80-4.5 MCG/ACT inhaler Inhale 2 puffs into the lungs daily.    Historical Provider, MD  carvedilol (COREG) 12.5 MG tablet Take 1 tablet (12.5 mg total) by mouth 2 (two) times daily with a meal. 11/18/15   Shelly Bombard, MD  esomeprazole (NEXIUM) 20 MG capsule Take 20 mg by mouth daily at 12 noon.    Historical Provider, MD  fluconazole (DIFLUCAN) 200 MG tablet Take 1 tablet (200 mg total) by mouth every other day. 11/18/15   Shelly Bombard, MD  folic acid (FOLVITE) 1 MG tablet Take 1 tablet (1 mg total) by mouth daily. 08/13/14   Shelly Bombard, MD  Multiple Vitamin (MULTIVITAMIN WITH MINERALS) TABS tablet Take 1 tablet by mouth daily.    Historical Provider, MD  naproxen sodium (ANAPROX) 220 MG tablet Take 220 mg by mouth 2 (two) times daily with a meal.    Historical Provider, MD  triamterene-hydrochlorothiazide (DYAZIDE) 37.5-25 MG capsule Take 1 each (1 capsule total) by mouth daily after breakfast. 11/18/15   Shelly Bombard, MD    Allergies Shellfish allergy; Flagyl [metronidazole]; Tramadol; Hydrocodone; and  Promethazine hcl  Family History  Problem Relation Age of Onset  . Hypertension Mother   . Colon cancer Maternal Grandmother   . Diabetes Maternal Grandmother   . Kidney disease Maternal Grandmother   . Diabetes Brother   . Diabetes Paternal Grandmother   . Diverticulosis Paternal Grandmother   . Anesthesia problems Neg Hx   . Malignant hyperthermia Neg Hx   . Pseudochol deficiency Neg Hx     Social History Social History  Substance Use Topics  . Smoking status: Never Smoker  . Smokeless tobacco: Never Used  . Alcohol use Yes     Comment: occasionally    Review of Systems Constitutional: No fever/chills Eyes: No visual changes. ENT: No sore throat. Cardiovascular: Denies chest pain. Respiratory: Denies shortness of breath. Gastrointestinal: No abdominal pain.  No nausea, no vomiting.  No diarrhea.  No constipation. Genitourinary: Negative for dysuria. Musculoskeletal: Negative for back pain. Skin: Negative for rash. Neurological: Positive for headaches, no focal weakness or numbness.  10-point ROS otherwise negative.   ____________________________________________   PHYSICAL EXAM:  Vitals:   12/04/15 0731 12/04/15 0735 12/04/15 0738 12/04/15 0800  BP:  (!) 147/102 139/88 (!) 134/98  Pulse:  82 79 79  Resp:  18 16 16   Temp:  97.7 F (36.5 C)    TempSrc:  Oral    SpO2:  100% 100% 100%  Weight: 205 lb (93 kg)     Height: 5' 1"  (1.549 m)       VITAL SIGNS: ED Triage Vitals  Enc Vitals Group     BP 12/04/15 0735 (!) 147/102     Pulse Rate 12/04/15 0735 82     Resp 12/04/15 0735 18     Temp 12/04/15 0735 97.7 F (36.5 C)     Temp Source 12/04/15 0735 Oral     SpO2 12/04/15 0735 100 %     Weight 12/04/15 0731 205 lb (93 kg)     Height 12/04/15 0731 5' 1"  (1.549 m)     Head Circumference --      Peak Flow --      Pain Score 12/04/15 0732 4     Pain Loc --      Pain Edu? --      Excl. in [place]? --     Constitutional: Alert and oriented. Well appearing  and in no acute distress. Eyes: Conjunctivae are normal. PERRL. EOMI. Head: Mild tenderness at the scalp vertex without any associated depression. No Battle sign. No raccoon's eyes. Nose: No congestion/rhinnorhea. Ears: No Hemotympanum Mouth/Throat: Mucous membranes are moist.  Oropharynx non-erythematous. Neck: No stridor.  No cervical spine tenderness to palpation. Cardiovascular: Normal rate, regular rhythm. Grossly normal heart sounds.  Good peripheral circulation. Respiratory: Normal respiratory effort.  No retractions. Lungs CTAB. Gastrointestinal: Soft and nontender. No distention.No CVA tenderness. Genitourinary: deferred Musculoskeletal: No lower extremity tenderness nor edema.  No joint effusions. Neurologic:  Normal speech and language. No gross focal neurologic deficits are appreciated. No gait instability. 5 out of 5 strength in bilateral upper and lower extremities, sensation intact to light touch throughout, cranial nerves II through XII intact, normal finger-nose-finger. Skin:  Skin is warm, dry and intact. No rash noted. Psychiatric: Mood and affect are normal. Speech and behavior are normal.  ____________________________________________   LABS (all labs ordered are listed, but only abnormal results are displayed)  Labs Reviewed - No data to display ____________________________________________  EKG  none ____________________________________________  RADIOLOGY  none ____________________________________________   PROCEDURES  Procedure(s) performed: None  Procedures  Critical Care performed: No  ____________________________________________   INITIAL IMPRESSION / ASSESSMENT AND PLAN / ED COURSE  Pertinent labs & imaging results that were available during my care of the patient were reviewed by me and considered in my medical decision making (see chart for details).  Victoria George is a 40 y.o. female with history of hypertension, not chronically  anticoagulated who presents for evaluation of 2 days of mild to moderate headache which began after a slip and fall in her kitchen from standing height on 12/02/15. On exam, she is very well-appearing and in no acute distress. Her vital signs are stable and she is afebrile. She has mild tenderness to palpation at the vertex of the scalp without evidence of an underlying skull fracture. She has declined any medications for pain control stating that her headache is mild. She has an intact neurological examination. On finger-nose-finger, she has no dysmetria but she has mild reproducible dizziness when she performs the task. No indication for CT of the head according to [place] CT head rules. I discussed this with her, we discussed symptoms/sequelae of concussion as well as very low likelihood of clinically significant closed head injury. We discussed meticulous immediate return precautions, need for close PCP follow-up and she is very comfortable with the discharge plan. DC home. Visual acuity was 20/25 in both eyes.  Clinical Course     ____________________________________________   FINAL CLINICAL IMPRESSION(S) / ED DIAGNOSES  Final diagnoses:  Minor head injury, initial encounter  Concussion, without loss of consciousness, initial encounter      NEW MEDICATIONS STARTED DURING THIS VISIT:  New Prescriptions   No medications on file     Note:  This document was prepared using Dragon voice recognition software and may include unintentional dictation errors.    Joanne Gavel, MD 12/04/15 [phone]

## 2015-12-04 NOTE — ED Notes (Signed)
Visual acuity 20/25.

## 2015-12-04 NOTE — ED Triage Notes (Signed)
Pt slipped in kitchen and hit head 2 days ago. C/o headaches, blurry vision, and dizziness. Thinks may have had few seconds LOC. Thinks heard crack sound. "i may need a CT to make sure don't have a closed head injury". Ambulatory to triage.

## 2015-12-18 ENCOUNTER — Emergency Department: Payer: PRIVATE HEALTH INSURANCE

## 2015-12-18 ENCOUNTER — Emergency Department
Admission: EM | Admit: 2015-12-18 | Discharge: 2015-12-18 | Disposition: A | Discharge status: Home / Self Care | Payer: PRIVATE HEALTH INSURANCE | Attending: Emergency Medicine | Admitting: Emergency Medicine

## 2015-12-18 DIAGNOSIS — R079 Chest pain, unspecified: Secondary | ICD-10-CM

## 2015-12-18 DIAGNOSIS — I110 Hypertensive heart disease with heart failure: Secondary | ICD-10-CM | POA: Diagnosis not present

## 2015-12-18 DIAGNOSIS — I509 Heart failure, unspecified: Secondary | ICD-10-CM | POA: Diagnosis not present

## 2015-12-18 DIAGNOSIS — Z79899 Other long term (current) drug therapy: Secondary | ICD-10-CM | POA: Insufficient documentation

## 2015-12-18 LAB — CBC
HCT: 35.1 % (ref 35.0–47.0)
HEMOGLOBIN: 12.1 g/dL (ref 12.0–16.0)
MCH: 30.1 pg (ref 26.0–34.0)
MCHC: 34.6 g/dL (ref 32.0–36.0)
MCV: 86.9 fL (ref 80.0–100.0)
Platelets: 235 10*3/uL (ref 150–440)
RBC: 4.04 MIL/uL (ref 3.80–5.20)
RDW: 15.2 % — ABNORMAL HIGH (ref 11.5–14.5)
WBC: 4.6 10*3/uL (ref 3.6–11.0)

## 2015-12-18 LAB — FIBRIN DERIVATIVES D-DIMER (ARMC ONLY): Fibrin derivatives D-dimer (ARMC): 538 — ABNORMAL HIGH (ref 0–499)

## 2015-12-18 LAB — BASIC METABOLIC PANEL
ANION GAP: 4 — AB (ref 5–15)
BUN: 17 mg/dL (ref 6–20)
CO2: 30 mmol/L (ref 22–32)
Calcium: 9 mg/dL (ref 8.9–10.3)
Chloride: 102 mmol/L (ref 101–111)
Creatinine, Ser: 1.07 mg/dL — ABNORMAL HIGH (ref 0.44–1.00)
GFR calc Af Amer: >60 mL/min (ref >60)
GLUCOSE: 101 mg/dL — AB (ref 65–99)
Potassium: 3.5 mmol/L (ref 3.5–5.1)
Sodium: 136 mmol/L (ref 135–145)

## 2015-12-18 LAB — POCT PREGNANCY, URINE: Preg Test, Ur: NEGATIVE

## 2015-12-18 LAB — TROPONIN I

## 2015-12-18 MED ORDER — SODIUM CHLORIDE 0.9 % IV BOLUS (SEPSIS)
1000.0000 mL | Freq: Once | INTRAVENOUS | Status: AC
  Administered 2015-12-18: 1000 mL via INTRAVENOUS

## 2015-12-18 MED ORDER — IOPAMIDOL (ISOVUE-370) INJECTION 76%
75.0000 mL | Freq: Once | INTRAVENOUS | Status: AC | PRN
  Administered 2015-12-18: 75 mL via INTRAVENOUS

## 2015-12-18 NOTE — ED Triage Notes (Signed)
Pt to triage via wheelchair. Pt reports around midnight she was laying in bed when she developed mid sternal chest pain that radiated into her left arm. Pt reports +n/v but denies shortness of breath or diaphoresis with the pain. Pt talking in full and complete sentences at this time.

## 2015-12-18 NOTE — ED Notes (Signed)
Repeat troponin obtained, pt denies further needs.

## 2015-12-18 NOTE — ED Notes (Signed)
Explanation of plan of care discussed with pt who verbalizes understanding. Call bell at side. Pt states she is pain/nausea free at this time.

## 2015-12-18 NOTE — ED Notes (Signed)
Pt states "i'm ready to go." pt denies pain or shob. md notified. Int removed intact.

## 2015-12-18 NOTE — ED Provider Notes (Signed)
[facility] Emergency Department Provider Note   ____________________________________________   First MD Initiated Contact with Patient 12/18/15 0402     (approximate)  I have reviewed the triage vital signs and the nursing notes.   HISTORY  Chief Complaint Chest Pain    HPI Victoria George is a 40 y.o. female who presents to the ED from home with a chief complaint of chest pain.Patient reports she was at rest approximately midnight when she developed central chest discomfort which radiated into her left arm. Patient reports she vomited once prior to arrival, then again in the lobby. Denies associated diaphoresis, shortness of breath, dizziness. States pain lasts for several minutes then goes away. Denies recent fever, chills, cough, congestion, abdominal pain, diarrhea. Denies recent trauma. Went to [place] last week. Denies OCP use. Nothing makes her symptoms better or worse.   Past Medical History:  Diagnosis Date  . CHF (congestive heart failure) ([place])   . Hypertension   . Sciatica     Patient Active Problem List   Diagnosis Date Noted  . UTI (lower urinary tract infection) 02/18/2014  . Gastritis 03/04/2011  . Musculoskeletal pain 03/04/2011  . Nausea & vomiting 03/02/2011  . HTN (hypertension) 03/02/2011  . Hypokalemia 03/02/2011  . Anemia 03/02/2011  . Abdominal pain, epigastric 01/29/2011    Past Surgical History:  Procedure Laterality Date  . CHOLECYSTECTOMY  02/01/2011   Procedure: LAPAROSCOPIC CHOLECYSTECTOMY WITH INTRAOPERATIVE CHOLANGIOGRAM;  Surgeon: Odis Hollingshead, MD;  Location: [facility];  Service: General;  Laterality: N/A;  Needs IOC and C-arm  . DIAGNOSTIC LAPAROSCOPY    . ESOPHAGOGASTRODUODENOSCOPY  01/30/2011   Procedure: ESOPHAGOGASTRODUODENOSCOPY (EGD);  Surgeon: Zenovia Jarred, MD;  Location: [facility];  Service: Gastroenterology;  Laterality: N/A;  . ESOPHAGOGASTRODUODENOSCOPY  03/03/2011   Procedure:  ESOPHAGOGASTRODUODENOSCOPY (EGD);  Surgeon: Zenovia Jarred, MD;  Location: [facility];  Service: Gastroenterology;  Laterality: N/A;    Prior to Admission medications   Medication Sig Start Date End Date Taking? Authorizing Provider  albuterol (PROVENTIL HFA;VENTOLIN HFA) 108 (90 BASE) MCG/ACT inhaler Inhale 2 puffs into the lungs every 6 (six) hours as needed for wheezing or shortness of breath.   Yes Historical Provider, MD  budesonide-formoterol (SYMBICORT) 80-4.5 MCG/ACT inhaler Inhale 2 puffs into the lungs daily.   Yes Historical Provider, MD  carvedilol (COREG) 12.5 MG tablet Take 1 tablet (12.5 mg total) by mouth 2 (two) times daily with a meal. 11/18/15  Yes Shelly Bombard, MD  Cholecalciferol (VITAMIN D3 PO) Take 1 tablet by mouth daily.   Yes Historical Provider, MD  esomeprazole (NEXIUM) 20 MG capsule Take 20 mg by mouth daily at 12 noon.   Yes Historical Provider, MD  folic acid (FOLVITE) 1 MG tablet Take 1 tablet (1 mg total) by mouth daily. 08/13/14  Yes Shelly Bombard, MD  Multiple Vitamin (MULTIVITAMIN WITH MINERALS) TABS tablet Take 1 tablet by mouth daily.   Yes Historical Provider, MD  naproxen sodium (ANAPROX) 220 MG tablet Take 220 mg by mouth 2 (two) times daily as needed (pain).    Yes Historical Provider, MD  triamterene-hydrochlorothiazide (DYAZIDE) 37.5-25 MG capsule Take 1 each (1 capsule total) by mouth daily after breakfast. 11/18/15  Yes Shelly Bombard, MD    Allergies Shellfish allergy; Flagyl [metronidazole]; Tramadol; Hydrocodone; and Promethazine hcl  Family History  Problem Relation Age of Onset  . Hypertension Mother   . Colon cancer Maternal Grandmother   . Diabetes Maternal Grandmother   . Kidney  disease Maternal Grandmother   . Diabetes Brother   . Diabetes Paternal Grandmother   . Diverticulosis Paternal Grandmother   . Anesthesia problems Neg Hx   . Malignant hyperthermia Neg Hx   . Pseudochol deficiency Neg Hx     Social History Social  History  Substance Use Topics  . Smoking status: Never Smoker  . Smokeless tobacco: Never Used  . Alcohol use Yes     Comment: occasionally    Review of Systems  Constitutional: No fever/chills. Eyes: No visual changes. ENT: No sore throat. Cardiovascular: Positive for chest pain. Respiratory: Denies shortness of breath. Gastrointestinal: No abdominal pain.  Positive for nausea and vomiting.  No diarrhea.  No constipation. Genitourinary: Negative for dysuria. Musculoskeletal: Negative for back pain. Skin: Negative for rash. Neurological: Negative for headaches, focal weakness or numbness.  10-point ROS otherwise negative.  ____________________________________________   PHYSICAL EXAM:  VITAL SIGNS: ED Triage Vitals  Enc Vitals Group     BP 12/18/15 0137 119/85     Pulse Rate 12/18/15 0137 80     Resp 12/18/15 0137 18     Temp 12/18/15 0137 98.7 F (37.1 C)     Temp Source 12/18/15 0137 Oral     SpO2 12/18/15 0137 100 %     Weight 12/18/15 0137 200 lb (90.7 kg)     Height 12/18/15 0137 5' 1"  (1.549 m)     Head Circumference --      Peak Flow --      Pain Score 12/18/15 0138 4     Pain Loc --      Pain Edu? --      Excl. in [employer]? --     Constitutional: Alert and oriented. Well appearing and in no acute distress. Eyes: Conjunctivae are normal. PERRL. EOMI. Head: Atraumatic. Nose: No congestion/rhinnorhea. Mouth/Throat: Mucous membranes are moist.  Oropharynx non-erythematous. Neck: No stridor.   Cardiovascular: Normal rate, regular rhythm. Grossly normal heart sounds.  Good peripheral circulation. Respiratory: Normal respiratory effort.  No retractions. Lungs CTAB. Central chest wall tender to palpation. Gastrointestinal: Soft and nontender. No distention. No abdominal bruits. No CVA tenderness. Musculoskeletal: No lower extremity tenderness nor edema.  No joint effusions.  No calf tenderness. Neurologic:  Normal speech and language. No gross focal neurologic  deficits are appreciated. No gait instability. Skin:  Skin is warm, dry and intact. No rash noted. Psychiatric: Mood and affect are normal. Speech and behavior are normal.  ____________________________________________   LABS (all labs ordered are listed, but only abnormal results are displayed)  Labs Reviewed  BASIC METABOLIC PANEL - Abnormal; Notable for the following:       Result Value   Glucose, Bld 101 (*)    Creatinine, Ser 1.07 (*)    Anion gap 4 (*)    All other components within normal limits  CBC - Abnormal; Notable for the following:    RDW 15.2 (*)    All other components within normal limits  FIBRIN DERIVATIVES D-DIMER ([facility] ONLY) - Abnormal; Notable for the following:    Fibrin derivatives D-dimer ([facility]) 538 (*)    All other components within normal limits  TROPONIN I  TROPONIN I  POC URINE PREG, ED  POCT PREGNANCY, URINE   ____________________________________________  EKG  ED ECG REPORT I, Hristopher Missildine J, the attending physician, personally viewed and interpreted this ECG.   Date: 12/18/2015  EKG Time: 0139  Rate: 82  Rhythm: normal EKG, normal sinus rhythm  Axis: Normal  Intervals:none  ST&T Change: Nonspecific  ____________________________________________  RADIOLOGY  Chest 2 view (viewed by me, interpreted per Dr. Radene Knee): No acute cardiopulmonary process seen.  CT angio chest interpreted per Dr. Dorann Lodge: No acute pulmonary embolism nor acute cardiopulmonary process though  limited evaluation due to bolus timing.   ____________________________________________   PROCEDURES  Procedure(s) performed: None  Procedures  Critical Care performed: No  ____________________________________________   INITIAL IMPRESSION / ASSESSMENT AND PLAN / ED COURSE  Pertinent labs & imaging results that were available during my care of the patient were reviewed by me and considered in my medical decision making (see chart for details).  40 year old female  who presents with chest pain. No pain currently. No complaints of nausea or vomiting. Initial EKG and troponin unremarkable. Will add Ddimer and repeat timed troponin.  Clinical Course  Comment By Time  No return of chest pain while patient was in the emergency department. She is eager for discharge. Updated patient and spouse of negative repeat troponin as well as negative CT chest. Will have her follow up closely with cardiology. Strict return precautions given. Both verbalize understanding. With plan of care. Paulette Blanch, MD 09/23 0654     ____________________________________________   FINAL CLINICAL IMPRESSION(S) / ED DIAGNOSES  Final diagnoses:  Nonspecific chest pain      NEW MEDICATIONS STARTED DURING THIS VISIT:  New Prescriptions   No medications on file     Note:  This document was prepared using Dragon voice recognition software and may include unintentional dictation errors.    Paulette Blanch, MD 12/18/15 [phone]

## 2015-12-18 NOTE — Discharge Instructions (Signed)
Continue all home medications. Return to the ER for worsening symptoms, persistent vomiting, difficulty breathing or other concerns.

## 2015-12-31 ENCOUNTER — Other Ambulatory Visit: Payer: Self-pay | Admitting: Obstetrics

## 2015-12-31 DIAGNOSIS — B9689 Other specified bacterial agents as the cause of diseases classified elsewhere: Secondary | ICD-10-CM

## 2015-12-31 DIAGNOSIS — N760 Acute vaginitis: Principal | ICD-10-CM

## 2016-03-03 ENCOUNTER — Other Ambulatory Visit: Payer: Self-pay | Admitting: Obstetrics

## 2016-03-03 DIAGNOSIS — N63 Unspecified lump in unspecified breast: Secondary | ICD-10-CM

## 2016-03-09 ENCOUNTER — Other Ambulatory Visit: Payer: Self-pay | Admitting: Obstetrics

## 2016-03-09 ENCOUNTER — Telehealth: Payer: Self-pay

## 2016-03-09 DIAGNOSIS — N390 Urinary tract infection, site not specified: Secondary | ICD-10-CM

## 2016-03-13 ENCOUNTER — Other Ambulatory Visit: Payer: Self-pay | Admitting: Obstetrics

## 2016-03-13 ENCOUNTER — Other Ambulatory Visit: Payer: Self-pay

## 2016-03-13 DIAGNOSIS — N63 Unspecified lump in unspecified breast: Secondary | ICD-10-CM

## 2016-03-14 ENCOUNTER — Ambulatory Visit
Admission: RE | Admit: 2016-03-14 | Discharge: 2016-03-14 | Disposition: A | Discharge status: Home / Self Care | Payer: PRIVATE HEALTH INSURANCE | Source: Ambulatory Visit | Attending: Obstetrics | Admitting: Obstetrics

## 2016-03-14 DIAGNOSIS — N63 Unspecified lump in unspecified breast: Secondary | ICD-10-CM

## 2016-03-14 NOTE — Telephone Encounter (Signed)
Routed to provider

## 2016-03-15 ENCOUNTER — Encounter (HOSPITAL_BASED_OUTPATIENT_CLINIC_OR_DEPARTMENT_OTHER): Payer: Self-pay | Admitting: *Deleted

## 2016-03-15 NOTE — Progress Notes (Signed)
NPO AFTER MN.  ARRIVE AT 0930.  NEEDS ISTAT AND URINE PREG.  CURRENT EKG IN CHART AND EPIC.  WILL DO SYMBICORT INHALER AM DOS W/ SIPS OF WATER.

## 2016-03-22 ENCOUNTER — Ambulatory Visit (HOSPITAL_BASED_OUTPATIENT_CLINIC_OR_DEPARTMENT_OTHER): Payer: PRIVATE HEALTH INSURANCE | Admitting: Anesthesiology

## 2016-03-22 ENCOUNTER — Encounter (HOSPITAL_BASED_OUTPATIENT_CLINIC_OR_DEPARTMENT_OTHER): Payer: Self-pay | Admitting: Anesthesiology

## 2016-03-22 ENCOUNTER — Encounter (HOSPITAL_BASED_OUTPATIENT_CLINIC_OR_DEPARTMENT_OTHER)
Admission: RE | Disposition: A | Discharge status: Home / Self Care | Payer: Self-pay | Source: Ambulatory Visit | Attending: Obstetrics and Gynecology

## 2016-03-22 ENCOUNTER — Ambulatory Visit (HOSPITAL_BASED_OUTPATIENT_CLINIC_OR_DEPARTMENT_OTHER)
Admission: RE | Admit: 2016-03-22 | Discharge: 2016-03-22 | Disposition: A | Discharge status: Home / Self Care | Payer: PRIVATE HEALTH INSURANCE | Source: Ambulatory Visit | Attending: Obstetrics and Gynecology | Admitting: Obstetrics and Gynecology

## 2016-03-22 DIAGNOSIS — N840 Polyp of corpus uteri: Secondary | ICD-10-CM | POA: Diagnosis not present

## 2016-03-22 DIAGNOSIS — D251 Intramural leiomyoma of uterus: Secondary | ICD-10-CM | POA: Diagnosis not present

## 2016-03-22 DIAGNOSIS — N736 Female pelvic peritoneal adhesions (postinfective): Secondary | ICD-10-CM | POA: Diagnosis not present

## 2016-03-22 DIAGNOSIS — Z79899 Other long term (current) drug therapy: Secondary | ICD-10-CM | POA: Insufficient documentation

## 2016-03-22 DIAGNOSIS — E669 Obesity, unspecified: Secondary | ICD-10-CM | POA: Insufficient documentation

## 2016-03-22 DIAGNOSIS — N920 Excessive and frequent menstruation with regular cycle: Secondary | ICD-10-CM | POA: Diagnosis not present

## 2016-03-22 DIAGNOSIS — D250 Submucous leiomyoma of uterus: Secondary | ICD-10-CM | POA: Diagnosis not present

## 2016-03-22 DIAGNOSIS — N979 Female infertility, unspecified: Secondary | ICD-10-CM | POA: Diagnosis not present

## 2016-03-22 DIAGNOSIS — I10 Essential (primary) hypertension: Secondary | ICD-10-CM | POA: Insufficient documentation

## 2016-03-22 DIAGNOSIS — Z6838 Body mass index (BMI) 38.0-38.9, adult: Secondary | ICD-10-CM | POA: Insufficient documentation

## 2016-03-22 HISTORY — DX: Personal history of other diseases of the digestive system: Z87.19

## 2016-03-22 HISTORY — DX: Personal history of other diseases of the female genital tract: Z87.42

## 2016-03-22 HISTORY — DX: Iron deficiency anemia, unspecified: D50.9

## 2016-03-22 HISTORY — PX: HYSTEROSCOPY: SHX211

## 2016-03-22 HISTORY — DX: Presence of spectacles and contact lenses: Z97.3

## 2016-03-22 HISTORY — PX: LAPAROSCOPIC GELPORT ASSISTED MYOMECTOMY: SHX6549

## 2016-03-22 LAB — POCT I-STAT, CHEM 8
BUN: 12 mg/dL (ref 6–20)
Calcium, Ion: 1.23 mmol/L (ref 1.15–1.40)
Chloride: 105 mmol/L (ref 101–111)
Creatinine, Ser: 0.9 mg/dL (ref 0.44–1.00)
Glucose, Bld: 81 mg/dL (ref 65–99)
HEMATOCRIT: 41 % (ref 36.0–46.0)
HEMOGLOBIN: 13.9 g/dL (ref 12.0–15.0)
Potassium: 4.1 mmol/L (ref 3.5–5.1)
SODIUM: 140 mmol/L (ref 135–145)
TCO2: 27 mmol/L (ref 0–100)

## 2016-03-22 LAB — POCT PREGNANCY, URINE: PREG TEST UR: NEGATIVE

## 2016-03-22 SURGERY — HYSTEROSCOPY
Anesthesia: General | Site: Vagina

## 2016-03-22 MED ORDER — METOCLOPRAMIDE HCL 5 MG/ML IJ SOLN
10.0000 mg | Freq: Once | INTRAMUSCULAR | Status: DC | PRN
  Filled 2016-03-22: qty 2

## 2016-03-22 MED ORDER — FENTANYL CITRATE (PF) 100 MCG/2ML IJ SOLN
25.0000 ug | INTRAMUSCULAR | Status: DC | PRN
  Administered 2016-03-22 (×2): 50 ug via INTRAVENOUS
  Filled 2016-03-22: qty 1

## 2016-03-22 MED ORDER — CEFAZOLIN SODIUM-DEXTROSE 2-4 GM/100ML-% IV SOLN
INTRAVENOUS | Status: AC
  Filled 2016-03-22: qty 100

## 2016-03-22 MED ORDER — MEPERIDINE HCL 25 MG/ML IJ SOLN
6.2500 mg | INTRAMUSCULAR | Status: DC | PRN
  Filled 2016-03-22: qty 1

## 2016-03-22 MED ORDER — KETOROLAC TROMETHAMINE 30 MG/ML IJ SOLN
INTRAMUSCULAR | Status: DC | PRN
  Administered 2016-03-22: 30 mg via INTRAVENOUS

## 2016-03-22 MED ORDER — ONDANSETRON HCL 4 MG/2ML IJ SOLN
INTRAMUSCULAR | Status: AC
  Filled 2016-03-22: qty 2

## 2016-03-22 MED ORDER — ONDANSETRON HCL 4 MG/2ML IJ SOLN
4.0000 mg | Freq: Once | INTRAMUSCULAR | Status: AC
  Administered 2016-03-22: 4 mg via INTRAVENOUS
  Filled 2016-03-22: qty 2

## 2016-03-22 MED ORDER — PROPOFOL 10 MG/ML IV BOLUS
INTRAVENOUS | Status: DC | PRN
Start: 2016-03-22 — End: 2016-03-22
  Administered 2016-03-22: 200 mg via INTRAVENOUS

## 2016-03-22 MED ORDER — SUGAMMADEX SODIUM 200 MG/2ML IV SOLN
INTRAVENOUS | Status: AC
  Filled 2016-03-22: qty 2

## 2016-03-22 MED ORDER — LABETALOL HCL 5 MG/ML IV SOLN
INTRAVENOUS | Status: DC | PRN
  Administered 2016-03-22: 5 mg via INTRAVENOUS
  Administered 2016-03-22: 10 mg via INTRAVENOUS
  Administered 2016-03-22: 5 mg via INTRAVENOUS

## 2016-03-22 MED ORDER — ONDANSETRON HCL 4 MG PO TABS: 4.0000 mg | ORAL_TABLET | Freq: Three times a day (TID) | ORAL | 0 refills | Status: DC | PRN

## 2016-03-22 MED ORDER — BUPIVACAINE-EPINEPHRINE 0.5% -1:200000 IJ SOLN
INTRAMUSCULAR | Status: DC | PRN
  Administered 2016-03-22: 10 mL

## 2016-03-22 MED ORDER — MIDAZOLAM HCL 5 MG/5ML IJ SOLN
INTRAMUSCULAR | Status: DC | PRN
  Administered 2016-03-22: 2 mg via INTRAVENOUS

## 2016-03-22 MED ORDER — DEXAMETHASONE SODIUM PHOSPHATE 4 MG/ML IJ SOLN
INTRAMUSCULAR | Status: DC | PRN
  Administered 2016-03-22: 10 mg via INTRAVENOUS

## 2016-03-22 MED ORDER — SODIUM CHLORIDE 0.9 % IR SOLN
Status: DC | PRN
  Administered 2016-03-22: 3000 mL

## 2016-03-22 MED ORDER — CEFAZOLIN SODIUM-DEXTROSE 2-4 GM/100ML-% IV SOLN
2.0000 g | INTRAVENOUS | Status: AC
  Administered 2016-03-22: 2 g via INTRAVENOUS
  Filled 2016-03-22: qty 100

## 2016-03-22 MED ORDER — SUGAMMADEX SODIUM 200 MG/2ML IV SOLN
INTRAVENOUS | Status: DC | PRN
  Administered 2016-03-22: 200 mg via INTRAVENOUS

## 2016-03-22 MED ORDER — FENTANYL CITRATE (PF) 100 MCG/2ML IJ SOLN
INTRAMUSCULAR | Status: DC | PRN
  Administered 2016-03-22: 50 ug via INTRAVENOUS
  Administered 2016-03-22: 100 ug via INTRAVENOUS
  Administered 2016-03-22 (×3): 50 ug via INTRAVENOUS

## 2016-03-22 MED ORDER — ROCURONIUM BROMIDE 50 MG/5ML IV SOSY
PREFILLED_SYRINGE | INTRAVENOUS | Status: DC | PRN
  Administered 2016-03-22: 10 mg via INTRAVENOUS
  Administered 2016-03-22: 40 mg via INTRAVENOUS
  Administered 2016-03-22: 10 mg via INTRAVENOUS

## 2016-03-22 MED ORDER — LIDOCAINE 2% (20 MG/ML) 5 ML SYRINGE
INTRAMUSCULAR | Status: DC | PRN
  Administered 2016-03-22: 100 mg via INTRAVENOUS

## 2016-03-22 MED ORDER — HYDROMORPHONE HCL 2 MG PO TABS
ORAL_TABLET | ORAL | Status: AC
  Filled 2016-03-22: qty 1

## 2016-03-22 MED ORDER — MIDAZOLAM HCL 2 MG/2ML IJ SOLN
INTRAMUSCULAR | Status: AC
  Filled 2016-03-22: qty 2

## 2016-03-22 MED ORDER — FENTANYL CITRATE (PF) 100 MCG/2ML IJ SOLN
INTRAMUSCULAR | Status: AC
  Filled 2016-03-22: qty 4

## 2016-03-22 MED ORDER — HYDROMORPHONE HCL 4 MG PO TABS: 4.0000 mg | ORAL_TABLET | ORAL | 0 refills | Status: DC | PRN

## 2016-03-22 MED ORDER — HYDRALAZINE HCL 20 MG/ML IJ SOLN
INTRAMUSCULAR | Status: DC | PRN
  Administered 2016-03-22 (×2): 2.5 mg via INTRAVENOUS

## 2016-03-22 MED ORDER — VASOPRESSIN 20 UNIT/ML IV SOLN
INTRAVENOUS | Status: DC | PRN
  Administered 2016-03-22: 17 mL via INTRAMUSCULAR

## 2016-03-22 MED ORDER — FENTANYL CITRATE (PF) 100 MCG/2ML IJ SOLN
INTRAMUSCULAR | Status: AC
  Filled 2016-03-22: qty 2

## 2016-03-22 MED ORDER — LACTATED RINGERS IV SOLN
INTRAVENOUS | Status: DC
  Filled 2016-03-22: qty 1000

## 2016-03-22 MED ORDER — ROCURONIUM BROMIDE 50 MG/5ML IV SOSY
PREFILLED_SYRINGE | INTRAVENOUS | Status: AC
  Filled 2016-03-22: qty 5

## 2016-03-22 MED ORDER — HEMOSTATIC AGENTS (NO CHARGE) OPTIME
TOPICAL | Status: DC | PRN
  Administered 2016-03-22: 2 via TOPICAL

## 2016-03-22 MED ORDER — PROPOFOL 10 MG/ML IV BOLUS
INTRAVENOUS | Status: AC
  Filled 2016-03-22: qty 40

## 2016-03-22 MED ORDER — METHYLENE BLUE 0.5 % INJ SOLN
INTRAVENOUS | Status: DC | PRN
  Administered 2016-03-22: 1 mL via SUBMUCOSAL

## 2016-03-22 MED ORDER — HYDROMORPHONE HCL 2 MG PO TABS
2.0000 mg | ORAL_TABLET | Freq: Once | ORAL | Status: AC
  Administered 2016-03-22: 2 mg via ORAL
  Filled 2016-03-22: qty 1

## 2016-03-22 MED ORDER — LABETALOL HCL 5 MG/ML IV SOLN
INTRAVENOUS | Status: AC
  Filled 2016-03-22: qty 4

## 2016-03-22 MED ORDER — ONDANSETRON HCL 4 MG/2ML IJ SOLN
INTRAMUSCULAR | Status: DC | PRN
  Administered 2016-03-22: 4 mg via INTRAVENOUS

## 2016-03-22 MED ORDER — LACTATED RINGERS IV SOLN
INTRAVENOUS | Status: DC
  Administered 2016-03-22 (×3): via INTRAVENOUS
  Filled 2016-03-22: qty 1000

## 2016-03-22 MED ORDER — HYDRALAZINE HCL 20 MG/ML IJ SOLN
INTRAMUSCULAR | Status: AC
Start: 2016-03-22 — End: 2016-03-22
  Filled 2016-03-22: qty 1

## 2016-03-22 MED ORDER — KETOROLAC TROMETHAMINE 30 MG/ML IJ SOLN
INTRAMUSCULAR | Status: AC
  Filled 2016-03-22: qty 1

## 2016-03-22 MED ORDER — LIDOCAINE 2% (20 MG/ML) 5 ML SYRINGE
INTRAMUSCULAR | Status: AC
  Filled 2016-03-22: qty 5

## 2016-03-22 MED ORDER — LACTATED RINGERS IR SOLN
Status: DC | PRN
  Administered 2016-03-22: 3000 mL

## 2016-03-22 MED ORDER — DEXAMETHASONE SODIUM PHOSPHATE 10 MG/ML IJ SOLN
INTRAMUSCULAR | Status: AC
  Filled 2016-03-22: qty 1

## 2016-03-22 SURGICAL SUPPLY — 109 items
APPLICATOR COTTON TIP 6IN STRL (MISCELLANEOUS) ×4 IMPLANT
BIPOLAR CUTTING LOOP 21FR (ELECTRODE)
BLADE SURG 10 STRL SS (BLADE) ×4 IMPLANT
BLADE SURG 11 STRL SS (BLADE) ×4 IMPLANT
CANISTER SUCTION 2500CC (MISCELLANEOUS) ×8 IMPLANT
CANNULA CURETTE W/SYR 6 (CANNULA) ×3 IMPLANT
CANNULA CURETTE W/SYR 6MM (CANNULA) ×1
CANNULA CURETTE W/SYR 7 (CANNULA) IMPLANT
CANNULA CURETTE W/SYR 7MM (CANNULA)
CATH HSG 5FRX28CM (CATHETERS) IMPLANT
CATH INTRA ACCESS BALLN (BALLOONS) IMPLANT
CATH ROBINSON RED A/P 16FR (CATHETERS) ×4 IMPLANT
CATH SALPINGOGRAPHY SELECT (BALLOONS) IMPLANT
CATH SSG INJECTION W/GUIDEWIRE (BALLOONS) IMPLANT
CLEANER CAUTERY TIP 5X5 PAD (MISCELLANEOUS) ×2 IMPLANT
CONT SPEC 4OZ CLIKSEAL STRL BL (MISCELLANEOUS) ×4 IMPLANT
CORDS BIPOLAR (ELECTRODE) IMPLANT
COVER BACK TABLE 60X90IN (DRAPES) ×4 IMPLANT
COVER MAYO STAND STRL (DRAPES) ×4 IMPLANT
DERMABOND ADVANCED (GAUZE/BANDAGES/DRESSINGS) ×2
DERMABOND ADVANCED .7 DNX12 (GAUZE/BANDAGES/DRESSINGS) ×2 IMPLANT
DEVICE TROCAR PUNCTURE CLOSURE (ENDOMECHANICALS) IMPLANT
DRAPE LG THREE QUARTER DISP (DRAPES) ×8 IMPLANT
DRAPE UNDERBUTTOCKS STRL (DRAPE) ×4 IMPLANT
DRSG OPSITE POSTOP 3X4 (GAUZE/BANDAGES/DRESSINGS) ×4 IMPLANT
DRSG TEGADERM 4X4.75 (GAUZE/BANDAGES/DRESSINGS) IMPLANT
DRSG TELFA 3X8 NADH (GAUZE/BANDAGES/DRESSINGS) IMPLANT
ELECT BIPOLAR POINTED 21FR (MISCELLANEOUS)
ELECT BLADE 6.5 .24CM SHAFT (ELECTRODE) IMPLANT
ELECT NEEDLE TIP 2.8 STRL (NEEDLE) IMPLANT
ELECT REM PT RETURN 9FT ADLT (ELECTROSURGICAL) ×4
ELECTRODE LOOP CTNG BIPLR 21FR (MISCELLANEOUS) IMPLANT
ELECTRODE REM PT RTRN 9FT ADLT (ELECTROSURGICAL) ×2 IMPLANT
FILTER SMOKE EVAC LAPAROSHD (FILTER) IMPLANT
GLOVE BIO SURGEON STRL SZ 6.5 (GLOVE) ×6 IMPLANT
GLOVE BIO SURGEON STRL SZ8 (GLOVE) ×4 IMPLANT
GLOVE BIO SURGEONS STRL SZ 6.5 (GLOVE) ×2
GLOVE BIOGEL PI IND STRL 6.5 (GLOVE) ×4 IMPLANT
GLOVE BIOGEL PI IND STRL 7.5 (GLOVE) ×2 IMPLANT
GLOVE BIOGEL PI IND STRL 8.5 (GLOVE) ×2 IMPLANT
GLOVE BIOGEL PI INDICATOR 6.5 (GLOVE) ×4
GLOVE BIOGEL PI INDICATOR 7.5 (GLOVE) ×2
GLOVE BIOGEL PI INDICATOR 8.5 (GLOVE) ×2
GOWN STRL REUS W/ TWL LRG LVL3 (GOWN DISPOSABLE) ×4 IMPLANT
GOWN STRL REUS W/ TWL XL LVL3 (GOWN DISPOSABLE) IMPLANT
GOWN STRL REUS W/TWL LRG LVL3 (GOWN DISPOSABLE) ×8 IMPLANT
GOWN STRL REUS W/TWL XL LVL3 (GOWN DISPOSABLE) ×4 IMPLANT
HOLDER FOLEY CATH W/STRAP (MISCELLANEOUS) IMPLANT
IV NS IRRIG 3000ML ARTHROMATIC (IV SOLUTION) ×4 IMPLANT
KIT ROOM TURNOVER WOR (KITS) ×4 IMPLANT
LEGGING LITHOTOMY PAIR STRL (DRAPES) ×4 IMPLANT
LOOP CUTTING BIPOLAR 21FR (ELECTRODE) IMPLANT
MANIFOLD NEPTUNE II (INSTRUMENTS) IMPLANT
MANIPULATOR UTERINE 4.5 ZUMI (MISCELLANEOUS) ×4 IMPLANT
NDL SAFETY ECLIPSE 18X1.5 (NEEDLE) ×2 IMPLANT
NEEDLE HYPO 18GX1.5 SHARP (NEEDLE) ×2
NEEDLE HYPO 22GX1.5 SAFETY (NEEDLE) ×4 IMPLANT
NEEDLE HYPO 25X1 1.5 SAFETY (NEEDLE) ×4 IMPLANT
NEEDLE INSUFFLATION 14GA 120MM (NEEDLE) ×4 IMPLANT
NEEDLE SPNL 22GX3.5 QUINCKE BK (NEEDLE) ×4 IMPLANT
NS IRRIG 500ML POUR BTL (IV SOLUTION) ×4 IMPLANT
PACK BASIN DAY SURGERY FS (CUSTOM PROCEDURE TRAY) ×4 IMPLANT
PACK LAPAROSCOPY II (CUSTOM PROCEDURE TRAY) ×4 IMPLANT
PAD CLEANER CAUTERY TIP 5X5 (MISCELLANEOUS) ×2
PAD ION RIGHT ARM DISP (MISCELLANEOUS) ×4 IMPLANT
PAD OB MATERNITY 4.3X12.25 (PERSONAL CARE ITEMS) ×4 IMPLANT
PAD POSITIONING PINK XL (MISCELLANEOUS) ×4 IMPLANT
PENCIL BUTTON HOLSTER BLD 10FT (ELECTRODE) IMPLANT
POUCH SPECIMEN RETRIEVAL 10MM (ENDOMECHANICALS) IMPLANT
SCALPEL HARMONIC ACE (MISCELLANEOUS) IMPLANT
SEALER TISSUE G2 CVD JAW 35 (ENDOMECHANICALS) IMPLANT
SEALER TISSUE G2 CVD JAW 45CM (ENDOMECHANICALS) IMPLANT
SEPRAFILM MEMBRANE 5X6 (MISCELLANEOUS) ×8 IMPLANT
SET IRRIG TUBING LAPAROSCOPIC (IRRIGATION / IRRIGATOR) ×4 IMPLANT
SET IRRIG Y TYPE TUR BLADDER L (SET/KITS/TRAYS/PACK) IMPLANT
SOLUTION ANTI FOG 6CC (MISCELLANEOUS) ×4 IMPLANT
SPONGE LAP 18X18 X RAY DECT (DISPOSABLE) IMPLANT
STENT BALLN UTERINE 4CM 6FR (STENTS) IMPLANT
SUT MNCRL AB 4-0 PS2 18 (SUTURE) ×4 IMPLANT
SUT PROLENE 0 CT 1 30 (SUTURE) IMPLANT
SUT SILK 2 0 SH (SUTURE) IMPLANT
SUT SILK 3 0 PS 1 (SUTURE) IMPLANT
SUT VIC AB 0 CT1 36 (SUTURE) IMPLANT
SUT VIC AB 2-0 CT1 27 (SUTURE) ×8
SUT VIC AB 2-0 CT1 TAPERPNT 27 (SUTURE) ×8 IMPLANT
SUT VIC AB 2-0 CT2 27 (SUTURE) IMPLANT
SUT VIC AB 2-0 UR6 27 (SUTURE) IMPLANT
SUT VIC AB 4-0 SH 27 (SUTURE) ×4
SUT VIC AB 4-0 SH 27XANBCTRL (SUTURE) ×4 IMPLANT
SUT VICRYL 0 TIES 12 18 (SUTURE) IMPLANT
SYR 20CC LL (SYRINGE) IMPLANT
SYR 30ML LL (SYRINGE) ×8 IMPLANT
SYR 3ML 18GX1 1/2 (SYRINGE) ×4 IMPLANT
SYR 3ML 23GX1 SAFETY (SYRINGE) IMPLANT
SYR 5ML LL (SYRINGE) ×4 IMPLANT
SYR CONTROL 10ML LL (SYRINGE) ×12 IMPLANT
SYRINGE 10CC LL (SYRINGE) ×8 IMPLANT
SYS LAPSCP GELPORT 120MM (MISCELLANEOUS)
SYSTEM LAPSCP GELPORT 120MM (MISCELLANEOUS) IMPLANT
TOWEL OR 17X24 6PK STRL BLUE (TOWEL DISPOSABLE) ×8 IMPLANT
TRAY DSU PREP LF (CUSTOM PROCEDURE TRAY) ×4 IMPLANT
TRAY FOLEY CATH SILVER 14FR (SET/KITS/TRAYS/PACK) ×4 IMPLANT
TROCAR OPTI TIP 5M 100M (ENDOMECHANICALS) ×8 IMPLANT
TROCAR XCEL DIL TIP R 11M (ENDOMECHANICALS) ×4 IMPLANT
TUBE CONNECTING 12'X1/4 (SUCTIONS) ×1
TUBE CONNECTING 12X1/4 (SUCTIONS) ×3 IMPLANT
TUBING INSUFFLATION 10FT LAP (TUBING) ×4 IMPLANT
WARMER LAPAROSCOPE (MISCELLANEOUS) ×4 IMPLANT
WATER STERILE IRR 500ML POUR (IV SOLUTION) ×4 IMPLANT

## 2016-03-22 NOTE — Discharge Instructions (Signed)
HOME CARE INSTRUCTIONS - LAPAROSCOPY  Wound Care: The bandaids or dressing which are placed over the skin openings may be removed the day after surgery. The incision should be kept clean and dry. The stitches do not need to be removed. Should the incision become sore, red, and swollen after the first week, check with your doctor.  Personal Hygiene: Shower the day after your procedure. Always wipe from front to back after elimination.   Activity: Do not drive or operate any equipment today. The effects of the anesthesia are still present and drowsiness may result. Rest today, not necessarily flat bed rest, just take it easy. You may resume your normal activity in one to three days or as instructed by your physician.  Sexual Activity: You resume sexual activity as indicated by your physician_________. Diet: Eat a light diet as desired this evening. You may resume a regular diet tomorrow.  Return to Work: Two to three days or as indicated by your doctor.  Expectations. Mild abdominal discomfort or tenderness is not unusual and some shoulder pain may also be noted which can be relieve After Surgery:  by lying flat. Call Your Doctor If these Occur:  Persistent or heavy bleeding at incision site       Redness or swelling around incision       Elevation of temperature greater than 100 degrees F  Call for follow-up appointment _____________.  Call your surgeon if you experience:   1.  Fever over 101.0. 2.  Inability to urinate. 3.  Nausea and/or vomiting. 4.  Extreme swelling or bruising at the surgical site. 5.  Continued bleeding from the incision. 6.  Increased pain, redness or drainage from the incision. 7.  Problems related to your pain medication. 8.  Any problems and/or concerns  D & C Home care Instructions:   Personal hygiene:  Used sanitary napkins for vaginal drainage not tampons. Shower or tub bathe the day after your procedure. No douching until bleeding stops. Always wipe  from front to back after  Elimination.  Activity: Do not drive or operate any equipment today. The effects of the anesthesia are still present and drowsiness may result. Rest today, not necessarily flat bed rest, just take it easy. You may resume your normal activity in one to 2 days.  Sexual activity: No intercourse for one week or as indicated by your physician  Diet: Eat a light diet as desired this evening. You may resume a regular diet tomorrow.  Return to work: One to 2 days.  General Expectations of your surgery: Vaginal bleeding should be no heavier than a normal period. Spotting may continue up to 10 days. Mild cramps may continue for a couple of days. You may have a regular period in 2-6 weeks.  Unexpected observations call your doctor if these occur: persistent or heavy bleeding. Severe abdominal cramping or pain. Elevation of temperature greater than 100F.  Call for an appointment in one week.    Patient's Signature_______________________________________________________  Nurse's Signature________________________________________________________ Post Anesthesia Home Care Instructions  Activity: Get plenty of rest for the remainder of the day. A responsible adult should stay with you for 24 hours following the procedure.  For the next 24 hours, DO NOT: -Drive a car -Paediatric nurse -Drink alcoholic beverages -Take any medication unless instructed by your physician -Make any legal decisions or sign important papers.  Meals: Start with liquid foods such as gelatin or soup. Progress to regular foods as tolerated. Avoid greasy, spicy, heavy foods. If nausea  and/or vomiting occur, drink only clear liquids until the nausea and/or vomiting subsides. Call your physician if vomiting continues.  Special Instructions/Symptoms: Your throat may feel dry or sore from the anesthesia or the breathing tube placed in your throat during surgery. If this causes discomfort, gargle with  warm salt water. The discomfort should disappear within 24 hours.  If you had a scopolamine patch placed behind your ear for the management of post- operative nausea and/or vomiting:  1. The medication in the patch is effective for 72 hours, after which it should be removed.  Wrap patch in a tissue and discard in the trash. Wash hands thoroughly with soap and water. 2. You may remove the patch earlier than 72 hours if you experience unpleasant side effects which may include dry mouth, dizziness or visual disturbances. 3. Avoid touching the patch. Wash your hands with soap and water after contact with the patch.

## 2016-03-22 NOTE — H&P (Signed)
Victoria George is a 40 y.o. 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 Jodi Mourning, for infertility with fibroids.  She was diagnosed with fibroids because of abnormal uterine bleeding and infertility. She has been having monthly periods but with heavy flow and prolonged duration. TVUS also showed endometrial polyp. Patient would like to preserve her childbearing potential.  Pertinent Gynecological History: Menses: flow is excessive with use of 3 pads or tampons on heaviest days Bleeding: dysfunctional uterine bleeding Contraception: none DES exposure: denies Blood transfusions: none Sexually transmitted diseases: no past history Last pap: normal   Menstrual History: Menarche age: 69  Past Medical History:  Diagnosis Date  . Asthmatic bronchitis   . History of gastritis    12/ 2012  . History of ovarian cyst   . Hypertension    PER PT BP HAS IMPROVED AND TAKEN OFF MEDICATIONS AND IS WATCHING DIET  . Intramural leiomyoma of uterus   . Iron deficiency anemia   . Wears glasses                     Past Surgical History:  Procedure Laterality Date  . CARDIOVASCULAR STRESS TEST    . CHOLECYSTECTOMY  02/01/2011   Procedure: LAPAROSCOPIC CHOLECYSTECTOMY WITH INTRAOPERATIVE CHOLANGIOGRAM;  Surgeon: Odis Hollingshead, MD;  Location: [facility];  Service: General;  Laterality: N/A;  Needs IOC and C-arm  . DIAGNOSTIC LAPAROSCOPY  07/2007   ?Lysis Adhesions for obstructive right fallopian tube  . ESOPHAGOGASTRODUODENOSCOPY  01/30/2011   Procedure: ESOPHAGOGASTRODUODENOSCOPY (EGD);  Surgeon: Zenovia Jarred, MD;  Location: [facility];  Service: Gastroenterology;  Laterality: N/A;  . ESOPHAGOGASTRODUODENOSCOPY  03/03/2011   Procedure: ESOPHAGOGASTRODUODENOSCOPY (EGD);  Surgeon: Zenovia Jarred, MD;  Location: [facility];  Service: Gastroenterology;  Laterality: N/A;  . TRANSTHORACIC ECHOCARDIOGRAM               Family History  Problem Relation Age of Onset  . Hypertension Mother   . Colon  cancer Maternal Grandmother   . Diabetes Maternal Grandmother   . Kidney disease Maternal Grandmother   . Diabetes Brother   . Diabetes Paternal Grandmother   . Diverticulosis Paternal Grandmother   . Anesthesia problems Neg Hx   . Malignant hyperthermia Neg Hx   . Pseudochol deficiency Neg Hx    No hereditary disease.  No cancer of breast, ovary, uterus. No cutaneous leiomyomatosis or renal cell carcinoma.  Social History   Social History  . Marital status: Married    Spouse name: N/A  . Number of children: 0  . 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 Smoking status: Never Smoker  . Smokeless tobacco: Never Used  . Alcohol use Yes     Comment: occasionally  . Drug use: No  . Sexual activity: Yes    Partners: Male    Birth control/ protection: None   Other Topics Concern  . Not on file   Social History Narrative  . No narrative on file    Allergies  Allergen Reactions  . Phenergan [Promethazine] Shortness Of Breath and Palpitations  . Shellfish Allergy Anaphylaxis    ALL SHELLFISH  . Codeine Hives  . Oxycodone Other (See Comments)    Percocet-- "insomnia"  . Tramadol Other (See Comments)    Ultram-- " manic episode"  . Hydrocodone Hives and Itching    vicodin    No current facility-administered medications on file prior to encounter.  Current Outpatient Prescriptions on File Prior to Encounter  Medication Sig Dispense Refill  . folic acid (FOLVITE) 1 MG tablet Take 1 tablet (1 mg total) by mouth daily. 90 tablet 3  . Multiple Vitamin (MULTIVITAMIN WITH MINERALS) TABS tablet Take 1 tablet by mouth daily.    . naproxen sodium (ANAPROX) 220 MG tablet Take 220 mg by mouth 2 (two) times daily as needed (pain).     Marland Kitchen albuterol (PROVENTIL HFA;VENTOLIN HFA) 108 (90 BASE) MCG/ACT inhaler Inhale 2 puffs into the lungs every 6 (six) hours as needed for wheezing or shortness of breath.    .  budesonide-formoterol (SYMBICORT) 80-4.5 MCG/ACT inhaler Inhale 2 puffs into the lungs every morning.     . metroNIDAZOLE (METROGEL) 0.75 % vaginal gel INSERT ONE APPLICATORFUL VAGINALLY TWICE DAILY (Patient taking differently: INSERT ONE APPLICATORFUL VAGINALLY TWICE DAILY  PRN) 70 g 2  . [DISCONTINUED] pantoprazole (PROTONIX) 40 MG tablet Take 40 mg by mouth every morning.    . [DISCONTINUED] promethazine (PHENERGAN) 25 MG tablet Take 1 tablet (25 mg total) by mouth every 6 (six) hours as needed for nausea. 30 tablet 0     Review of Systems  Constitutional: Negative.   HENT: Negative.   Eyes: Negative.   Respiratory: Negative.   Cardiovascular: Negative.   Gastrointestinal: Negative.   Genitourinary: Negative.   Musculoskeletal: Negative.   Skin: Negative.   Neurological: Negative.   Endo/Heme/Allergies: Negative.   Psychiatric/Behavioral: Negative.    Physical Exam  BP (!) 150/92 (BP Location: Right Arm)   Pulse 82   Temp 98.3 F (36.8 C) (Oral)   Resp 16   Ht 5' 1"  (1.549 m)   Wt 92.5 kg (204 lb)   LMP 02/22/2016   SpO2 100%   BMI 38.55 kg/m    Constitutional: She is oriented to person, place, and time. She appears well-developed and well-nourished.  HENT:  Head: Normocephalic and atraumatic.  Nose: Nose normal.  Mouth/Throat: Oropharynx is clear and moist. No oropharyngeal exudate.  Eyes: Conjunctivae normal and EOM are normal. Pupils are equal, round, and reactive to light. No scleral icterus.  Neck: Normal range of motion. Neck supple. No tracheal deviation present. No thyromegaly present.  Cardiovascular: Normal rate.   Respiratory: Effort normal and breath sounds normal.  GI: Soft. Bowel sounds are normal. She exhibits no distension and no mass. There is no tenderness.  Lymphadenopathy:  She has no cervical adenopathy.  Neurological: She is alert and oriented to person, place, and time. She has normal reflexes.  Skin: Skin is warm.  Psychiatric: She has a  normal mood and affect. Her behavior is normal. Judgment and thought content normal.   Assessment/Plan:  Intramural and subserosal uterine myomas, causing menorrhagia and pressure sensation. Preoperative for L/S Gelport assisted myomectomy Benefits and risks of L/S myomectomy and H/S polypectomy were discussed with the patient and her family member again.  Bowel prep instructions were given.  All of patient's questions were answered.  She verbalized understanding.  She knows that she will need a cesarean delivery for future pregnancies, and that it is recommended she does not conceive for 2-3 months for uterus to heal.

## 2016-03-22 NOTE — Op Note (Signed)
Operative Note  Preoperative diagnosis: Uterine fibroids, endometrial polyp, menorrhagia, infertility  Postoperative diagnosis: Uterine fibroids, intramural, submucosal, and subserosal, pelvic adhesions, endometrial polyp, menorrhagia, infertility  Procedure: Hysteroscopy, hysteroscopic resection of myoma, polypectomy, suction D&C, laparoscopy, GelPort assisted myomectomy, lysis of adhesions, chromotubation  Anesthesia: Gen. endotracheal  Complications: None  Estimated blood loss: 50 mL  Specimens: Submucosal myoma, endometrial polyp with endometrial curettings, and uterine myoma to pathology  Findings: On examination under anesthesia, external genitalia, Bartholin's, Skene's, and urethra were normal. The vagina was normal. The cervix was nulliparous and appeared grossly normal. The uterus was 10-11 week size, firm and mobile with irregularities caused by myomas. It sounded to 9 cm.  On hysteroscopy, endocervical canal was normal.  The cavity was distorted because of the extrinsic pressure of the large transmural fundal myoma.  Both tubal ostia were seen.  There was a 1 x 1 cm submucosal myoma on the anterior wall, which was removed. There was a 1.5 x 1 cm posterior polyp. On laparoscopy, upper abdomen, liver surface and diaphragm surfaces were normal. There were perihepatic adhesions between the left lobe of the liver and the diaphragm.Gallbladder was surgically absent. The appendix was not visualized. The uterus contained a 6 x 5 cm fundal transmural myoma with the lower aspect of it attached to the endometrium, which was not entered during the myomectomy. There were also small less than 1 cm posterior subserosal myomas which were not removed. The left tube had advancement of the serosa onto the fimbria without causing hydrosalpinx. Left ovary appeared normal. The right tube appeared Also had advancement of the serosa over the fimbria.  It was 10% adherent with filmy adhesions between  the infundibulum of the tube and the posterior surface of the uterus.  These were sharply lysed.  During chromotubation the tubes did not fill, but this may been due to a technical problem. The pelvic peritoneum looked mostly normal.    Description of the procedure: The patient was placed in dorsal supine position and general endotracheal anesthesia was given. 2 g of cefazolin were given intravenously for prophylaxis. Patient was placed in lithotomy position. She was prepped and draped in sterile manner. The Foley catheter was inserted into the bladder. We first performed a hysteroscopy with a 12 SlimLine hysteroscope.  The distention medium was normal saline.  The distention method was gravity.  Above findings were noted. Using hysteroscopic scissors, the polyp was excised and brought out with hysteroscopic graspers and submitted to pathology.  Next we used hysteroscopic scissors to continue create the anterior submucosal myoma and also broad and with the graspers.  A ZUMI catheter was placed into the uterine cavity. This was connected to a syringe containing diluted methylene blue solution which was used to define the endometrium during the myomectomy. The uterus sounded to 9 cm The surgeon was regloved and a surgical field was created on the abdomen.  After preemptive anesthesia of all surgical sites with 0.5% bupivacaine, a 5 mm intraumbilical skin incision was made and a Verress needle was inserted.Its correct location could not be confirmed.  Therefore, we made a left upper quadrant 5 mm incision at the intersection of anterior axillary line with the costal margin and inserted the Verress needle. Its correct location could be verified with low opening pressure.  A pneumoperitoneum was created with carbon dioxide.  5 mm laparoscope with a 30 lens was inserted and video laparoscopy was started . No adhesions were noted under the umbilical incision.  Therefore a 5 mm  trocar could not be inserted  under direct visualization. Above findings were noted.  A dilute solution of vasopressin (0.4 units per mL) was injected into the myometrium overlying the fundal myoma, until the myometrium blanched. A needle electrode with 73 W of cutting current  was used to make a transverse incision on the myometrium overlying the fundal 6 cm myoma. The myoma was grasped with tenaculum and dissection was started.  We then made a 3 cm transverse suprapubic incision to insert a GelPort. After dissection of the anatomic layers, the peritoneal cavity was entered. A GelPort was placed and the rest of the case was performed either using this port as a laparoscopic port or as a minilaparotomy port.  The rest of the myoma had to be in situ morcellated to eventually take it out of this 3 cm incision. This was carried out by blunt and sharp dissection and in situ morcellation was performed with #10 blade. The endometrial cavity was not entered at the lower aspect of the 6 cm myoma. The myoma defect was closed in 3 layers: The first layer was a deep myometrial suture of 2-0 Vicryl continuous interlocking suture, the second layer was a superficial myometrial layer of 2-0 Vicryl continuous suture. A 4-0 Vicryl continuous suture was placed on the serosa and the most superficial myometrium.   The suprapubic fascial incision was closed with 2-0 Vicryl continuous suture. Subcutaneous tissue was irrigated and aspirated good hemostasis was achieved. The abdomen and the pelvis was carefully inspected under laparoscopic visualization and the pelvis was copiously irrigated and aspirated. A slurry of 2 sheets of Seprafilm in 60 mL of normal saline was injected as an adhesion barrier into the pelvis. The gas was allowed to escape. The instrument and the lap pad count were correct. The trochars were removed. The skin incisions were approximated with Dermabond.  The skin incision below and into the gel port was approximated with 4-0 Monocryl  subcuticular sutures.  The patient tolerated the procedure well and was transferred to recovery room in satisfactory condition.  SPECIAL NOTE: Because of the extent of the myometrial incision during the uterine myomectomy, it is recommended that this patient deliver by a cesarean section with her future pregnancies.  Governor Specking, MD

## 2016-03-22 NOTE — Transfer of Care (Signed)
Immediate Anesthesia Transfer of Care Note  Patient: Victoria George  Procedure(s) Performed: Procedure(s): HYSTEROSCOPY, POLYPECTOMY (N/A) LAPAROSCOPIC GELPORT ASSISTED MYOMECTOMY, LYSIS OF ADHESIONS, CHROMOPERTUBATION (N/A)  Patient Location: PACU  Anesthesia Type:General  Level of Consciousness: awake and oriented  Airway & Oxygen Therapy: Patient Spontanous Breathing and Patient connected to nasal cannula oxygen  Post-op Assessment: Report given to RN  Post vital signs: Reviewed and stable  Last Vitals: 132/69, 83, 26, 100%, 98.4 Vitals:   03/22/16 0835 03/22/16 0837  BP: (!) 150/104 (!) 150/92  Pulse: 82   Resp: 16   Temp: 36.8 C     Last Pain:  Vitals:   03/22/16 0835  TempSrc: Oral      Patients Stated Pain Goal: 6 (43/27/61 4709)  Complications: No apparent anesthesia complications

## 2016-03-22 NOTE — Anesthesia Preprocedure Evaluation (Signed)
Anesthesia Evaluation  Patient identified by MRN, date of birth, ID band Patient awake    Reviewed: Allergy & Precautions, H&P , NPO status , Patient's Chart, lab work & pertinent test results  Airway Mallampati: II  TM Distance: >3 FB Neck ROM: Full    Dental no notable dental hx.    Pulmonary neg pulmonary ROS,    Pulmonary exam normal breath sounds clear to auscultation       Cardiovascular hypertension, Pt. on medications negative cardio ROS Normal cardiovascular exam Rhythm:Regular Rate:Normal     Neuro/Psych negative neurological ROS  negative psych ROS   GI/Hepatic negative GI ROS, Neg liver ROS,   Endo/Other  negative endocrine ROS  Renal/GU negative Renal ROS  negative genitourinary   Musculoskeletal negative musculoskeletal ROS (+)   Abdominal (+) + obese,   Peds negative pediatric ROS (+)  Hematology negative hematology ROS (+)   Anesthesia Other Findings   Reproductive/Obstetrics negative OB ROS                             Anesthesia Physical  Anesthesia Plan  ASA: II  Anesthesia Plan: General   Post-op Pain Management:    Induction: Intravenous  Airway Management Planned: Oral ETT  Additional Equipment:   Intra-op Plan:   Post-operative Plan: Extubation in OR  Informed Consent: I have reviewed the patients History and Physical, chart, labs and discussed the procedure including the risks, benefits and alternatives for the proposed anesthesia with the patient or authorized representative who has indicated his/her understanding and acceptance.   Dental advisory given  Plan Discussed with: CRNA  Anesthesia Plan Comments:         Anesthesia Quick Evaluation

## 2016-03-22 NOTE — Anesthesia Procedure Notes (Signed)
Procedure Name: Intubation Date/Time: 03/22/2016 12:05 PM Performed by: Bethena Roys T Pre-anesthesia Checklist: Patient identified, Emergency Drugs available, Suction available and Patient being monitored Patient Re-evaluated:Patient Re-evaluated prior to inductionOxygen Delivery Method: Circle system utilized Preoxygenation: Pre-oxygenation with 100% oxygen Intubation Type: IV induction Ventilation: Mask ventilation without difficulty Laryngoscope Size: Mac and 3 Grade View: Grade I Tube type: Oral Tube size: 7.0 mm Number of attempts: 1 Airway Equipment and Method: Stylet and Oral airway Placement Confirmation: ETT inserted through vocal cords under direct vision,  positive ETCO2 and breath sounds checked- equal and bilateral Secured at: 19 cm Tube secured with: Tape Dental Injury: Teeth and Oropharynx as per pre-operative assessment

## 2016-03-22 NOTE — Anesthesia Postprocedure Evaluation (Signed)
Anesthesia Post Note  Patient: Verlaine Embry [place]  Procedure(s) Performed: Procedure(s) (LRB): HYSTEROSCOPY, POLYPECTOMY (N/A) LAPAROSCOPIC GELPORT ASSISTED MYOMECTOMY, LYSIS OF ADHESIONS, CHROMOPERTUBATION (N/A)  Patient location during evaluation: PACU Anesthesia Type: General Level of consciousness: awake and alert Pain management: pain level controlled Vital Signs Assessment: post-procedure vital signs reviewed and stable Respiratory status: spontaneous breathing, nonlabored ventilation and respiratory function stable Cardiovascular status: blood pressure returned to baseline and stable Postop Assessment: no signs of nausea or vomiting Anesthetic complications: no       Last Vitals:  Vitals:   03/22/16 1500 03/22/16 1515  BP: 129/67 126/80  Pulse: 87 88  Resp: 15 17  Temp:      Last Pain:  Vitals:   03/22/16 1500  TempSrc:   PainSc: 4                  [place],W. EDMOND

## 2016-03-23 ENCOUNTER — Encounter (HOSPITAL_BASED_OUTPATIENT_CLINIC_OR_DEPARTMENT_OTHER): Payer: Self-pay | Admitting: Obstetrics and Gynecology

## 2016-08-21 ENCOUNTER — Inpatient Hospital Stay (HOSPITAL_COMMUNITY)
Admission: AD | Admit: 2016-08-21 | Discharge: 2016-08-21 | Disposition: A | Discharge status: Home / Self Care | Payer: No Typology Code available for payment source | Source: Ambulatory Visit | Attending: Obstetrics and Gynecology | Admitting: Obstetrics and Gynecology

## 2016-08-21 ENCOUNTER — Encounter (HOSPITAL_COMMUNITY): Payer: Self-pay

## 2016-08-21 DIAGNOSIS — N898 Other specified noninflammatory disorders of vagina: Secondary | ICD-10-CM | POA: Diagnosis not present

## 2016-08-21 DIAGNOSIS — Z79899 Other long term (current) drug therapy: Secondary | ICD-10-CM | POA: Insufficient documentation

## 2016-08-21 DIAGNOSIS — R102 Pelvic and perineal pain: Secondary | ICD-10-CM | POA: Diagnosis not present

## 2016-08-21 DIAGNOSIS — I10 Essential (primary) hypertension: Secondary | ICD-10-CM | POA: Diagnosis not present

## 2016-08-21 DIAGNOSIS — N3000 Acute cystitis without hematuria: Secondary | ICD-10-CM | POA: Insufficient documentation

## 2016-08-21 DIAGNOSIS — J45909 Unspecified asthma, uncomplicated: Secondary | ICD-10-CM | POA: Insufficient documentation

## 2016-08-21 LAB — URINALYSIS, ROUTINE W REFLEX MICROSCOPIC
GLUCOSE, UA: NEGATIVE mg/dL
HGB URINE DIPSTICK: NEGATIVE
Ketones, ur: NEGATIVE mg/dL
Leukocytes, UA: NEGATIVE
Nitrite: NEGATIVE
PH: 6.5 (ref 5.0–8.0)
PROTEIN: NEGATIVE mg/dL
Specific Gravity, Urine: 1.02 (ref 1.005–1.030)

## 2016-08-21 LAB — WET PREP, GENITAL
Clue Cells Wet Prep HPF POC: NONE SEEN
SPERM: NONE SEEN
TRICH WET PREP: NONE SEEN
Yeast Wet Prep HPF POC: NONE SEEN

## 2016-08-21 LAB — POCT PREGNANCY, URINE: Preg Test, Ur: NEGATIVE

## 2016-08-21 MED ORDER — NITROFURANTOIN MONOHYD MACRO 100 MG PO CAPS: 100.0000 mg | ORAL_CAPSULE | Freq: Two times a day (BID) | ORAL | 0 refills | Status: DC

## 2016-08-21 MED ORDER — IBUPROFEN 800 MG PO TABS
800.0000 mg | ORAL_TABLET | Freq: Once | ORAL | Status: AC
  Administered 2016-08-21: 800 mg via ORAL
  Filled 2016-08-21: qty 1

## 2016-08-21 NOTE — Discharge Instructions (Signed)
Urinary Tract Infection, Adult A urinary tract infection (UTI) is an infection of any part of the urinary tract. The urinary tract includes the:  Kidneys.  Ureters.  Bladder.  Urethra. These organs make, store, and get rid of pee (urine) in the body. Follow these instructions at home:  Take over-the-counter and prescription medicines only as told by your doctor.  If you were prescribed an antibiotic medicine, take it as told by your doctor. Do not stop taking the antibiotic even if you start to feel better.  Avoid the following drinks:  Alcohol.  Caffeine.  Tea.  Carbonated drinks.  Drink enough fluid to keep your pee clear or pale yellow.  Keep all follow-up visits as told by your doctor. This is important.  Make sure to:  Empty your bladder often and completely. Do not to hold pee for long periods of time.  Empty your bladder before and after sex.  Wipe from front to back after a bowel movement if you are female. Use each tissue one time when you wipe. Contact a doctor if:  You have back pain.  You have a fever.  You feel sick to your stomach (nauseous).  You throw up (vomit).  Your symptoms do not get better after 3 days.  Your symptoms go away and then come back. Get help right away if:  You have very bad back pain.  You have very bad lower belly (abdominal) pain.  You are throwing up and cannot keep down any medicines or water. This information is not intended to replace advice given to you by your health care provider. Make sure you discuss any questions you have with your health care provider. Document Released: 08/30/2007 Document Revised: 08/19/2015 Document Reviewed: 02/01/2015 Elsevier Interactive Patient Education  2017 [employer].

## 2016-08-21 NOTE — MAU Provider Note (Signed)
History     CSN: [id number]  Arrival date and time: 08/21/16 2952   First Provider Initiated Contact with Patient 08/21/16 [phone]      Chief Complaint  Patient presents with  . Pelvic Pain   Pelvic Pain  The patient's primary symptoms include pelvic pain and vaginal discharge. This is a new problem. Episode onset: 5 days ago.  The problem occurs intermittently. Pain severity now: 7/10  The problem affects the left side. She is not pregnant. Associated symptoms include frequency. Pertinent negatives include no chills, diarrhea, dysuria ("friday I had some pain"), fever, urgency or vomiting. The vaginal discharge was white and malodorous. There has been no bleeding. She has not been passing clots. She has not been passing tissue. The symptoms are aggravated by intercourse and activity. Treatments tried: azo cranberry pills. Went to urgent care on Friday. Started on Macrobid, but she only 3 doses total. She thinks she may have thrown it out.  She uses nothing for contraception. Her menstrual history has been regular (LMP 08/08/16 ).    Past Medical History:  Diagnosis Date  . Asthmatic bronchitis   . History of gastritis    12/ 2012  . History of ovarian cyst   . Hypertension    PER PT BP HAS IMPROVED AND TAKEN OFF MEDICATIONS AND IS WATCHING DIET  . Intramural leiomyoma of uterus   . Iron deficiency anemia   . Wears glasses     Past Surgical History:  Procedure Laterality Date  . CARDIOVASCULAR STRESS TEST    . CHOLECYSTECTOMY  02/01/2011   Procedure: LAPAROSCOPIC CHOLECYSTECTOMY WITH INTRAOPERATIVE CHOLANGIOGRAM;  Surgeon: Odis Hollingshead, MD;  Location: [facility];  Service: General;  Laterality: N/A;  Needs IOC and C-arm  . DIAGNOSTIC LAPAROSCOPY  07/2007   ?Lysis Adhesions for obstructive right fallopian tube  . ESOPHAGOGASTRODUODENOSCOPY  01/30/2011   Procedure: ESOPHAGOGASTRODUODENOSCOPY (EGD);  Surgeon: Zenovia Jarred, MD;  Location: [facility];  Service: Gastroenterology;   Laterality: N/A;  . ESOPHAGOGASTRODUODENOSCOPY  03/03/2011   Procedure: ESOPHAGOGASTRODUODENOSCOPY (EGD);  Surgeon: Zenovia Jarred, MD;  Location: [facility];  Service: Gastroenterology;  Laterality: N/A;  . HYSTEROSCOPY N/A 03/22/2016   Procedure: HYSTEROSCOPY, POLYPECTOMY;  Surgeon: Governor Specking, MD;  Location: [facility];  Service: Gynecology;  Laterality: N/A;  . LAPAROSCOPIC GELPORT ASSISTED MYOMECTOMY N/A 03/22/2016   Procedure: LAPAROSCOPIC GELPORT ASSISTED MYOMECTOMY, LYSIS OF ADHESIONS, CHROMOPERTUBATION;  Surgeon: Governor Specking, MD;  Location: [facility];  Service: Gynecology;  Laterality: N/A;  . TRANSTHORACIC ECHOCARDIOGRAM      Family History  Problem Relation Age of Onset  . Hypertension Mother   . Colon cancer Maternal Grandmother   . Diabetes Maternal Grandmother   . Kidney disease Maternal Grandmother   . Diabetes Brother   . Diabetes Paternal Grandmother   . Diverticulosis Paternal Grandmother   . Anesthesia problems Neg Hx   . Malignant hyperthermia Neg Hx   . Pseudochol deficiency Neg Hx     Social History  Substance Use Topics  . Smoking status: Never Smoker  . Smokeless tobacco: Never Used  . Alcohol use Yes     Comment: occasionally    Allergies:  Allergies  Allergen Reactions  . Phenergan [Promethazine] Shortness Of Breath and Palpitations  . Shellfish Allergy Anaphylaxis    ALL SHELLFISH  . Codeine Hives  . Oxycodone Other (See Comments)    Percocet-- "insomnia"  . Tramadol Other (See Comments)    Ultram-- " manic episode"  . Hydrocodone Hives and  Itching    vicodin    Prescriptions Prior to Admission  Medication Sig Dispense Refill Last Dose  . albuterol (PROVENTIL HFA;VENTOLIN HFA) 108 (90 BASE) MCG/ACT inhaler Inhale 2 puffs into the lungs every 6 (six) hours as needed for wheezing or shortness of breath.   More than a month at Unknown time  . budesonide-formoterol (SYMBICORT) 80-4.5 MCG/ACT inhaler  Inhale 2 puffs into the lungs every morning.    More than a month at Unknown time  . Cholecalciferol (VITAMIN D3) 2000 units TABS Take 1 tablet by mouth daily.   03/21/2016 at Unknown time  . ferrous sulfate 325 (65 FE) MG EC tablet Take 325 mg by mouth daily with breakfast.   03/21/2016 at Unknown time  . folic acid (FOLVITE) 1 MG tablet Take 1 tablet (1 mg total) by mouth daily. 90 tablet 3 03/21/2016 at Unknown time  . HYDROmorphone (DILAUDID) 4 MG tablet Take 1 tablet (4 mg total) by mouth every 4 (four) hours as needed for severe pain. 20 tablet 0   . metroNIDAZOLE (METROGEL) 0.75 % vaginal gel INSERT ONE APPLICATORFUL VAGINALLY TWICE DAILY (Patient taking differently: INSERT ONE APPLICATORFUL VAGINALLY TWICE DAILY  PRN) 70 g 2 More than a month at Unknown time  . Multiple Vitamin (MULTIVITAMIN WITH MINERALS) TABS tablet Take 1 tablet by mouth daily.   03/21/2016 at Unknown time  . naproxen sodium (ANAPROX) 220 MG tablet Take 220 mg by mouth 2 (two) times daily as needed (pain).    Past Month at Unknown time  . nitrofurantoin, macrocrystal-monohydrate, (MACROBID) 100 MG capsule TAKE ONE CAPSULE BY MOUTH TWICE DAILY (Patient taking differently: TAKE ONE CAPSULE BY MOUTH TWICE DAILY--- STARTED 03-09-2016  AND END 03-16-2016) 14 capsule 0 Past Week at Unknown time  . ondansetron (ZOFRAN) 4 MG tablet Take 1 tablet (4 mg total) by mouth every 8 (eight) hours as needed for nausea or vomiting. 20 tablet 0   . Probiotic Product (ALIGN) 4 MG CAPS Take 1 capsule by mouth daily.   03/21/2016 at Unknown time    Review of Systems  Constitutional: Negative for chills and fever.  Gastrointestinal: Negative for diarrhea and vomiting.  Genitourinary: Positive for frequency, pelvic pain and vaginal discharge. Negative for dysuria ("friday I had some pain"), urgency and vaginal bleeding.   Physical Exam   Blood pressure (!) 143/103, temperature 98 F (36.7 C), temperature source Oral, resp. rate 16, height 5'  1" (1.549 m), weight 209 lb 1.3 oz (94.8 kg), last menstrual period 08/08/2016.  Physical Exam  Nursing note and vitals reviewed. Constitutional: She is oriented to person, place, and time. She appears well-developed and well-nourished. No distress.  HENT:  Head: Normocephalic.  Cardiovascular: Normal rate.   Respiratory: Effort normal.  GI: Soft. There is tenderness (suprapubic tenderness ). There is no rebound.  Genitourinary:  Genitourinary Comments: No CMT  Neurological: She is alert and oriented to person, place, and time.  Skin: Skin is warm and dry.  Psychiatric: She has a normal mood and affect.    MAU Course  Procedures  MDM Wet Prep, GC/CT pending [id number]: Care turned over to [facility], CNM Marcille Buffy 8:12 AM 08/21/16   Wet prep: negative    Assessment and Plan   1. Acute cystitis without hematuria    2. Patient stable for discharge; RX for macrobid sent to pharmacy on file.  3. Patient encouraged to follow up with her PCP if her UTI symptoms are not resolved within 2-3 days  Curt Bears  Ardean Larsen

## 2016-08-21 NOTE — MAU Note (Signed)
Patient presents with lower abdominal pain since Thursday, mid to left side, no vaginal bleeding, discharge with an odor, LMP 08/08/16

## 2016-08-22 LAB — GC/CHLAMYDIA PROBE AMP (~~LOC~~) NOT AT ARMC
Chlamydia: NEGATIVE
NEISSERIA GONORRHEA: NEGATIVE

## 2016-09-24 ENCOUNTER — Emergency Department (HOSPITAL_COMMUNITY): Payer: PRIVATE HEALTH INSURANCE

## 2016-09-24 ENCOUNTER — Emergency Department (HOSPITAL_COMMUNITY)
Admission: EM | Admit: 2016-09-24 | Discharge: 2016-09-24 | Payer: PRIVATE HEALTH INSURANCE | Attending: Emergency Medicine | Admitting: Emergency Medicine

## 2016-09-24 ENCOUNTER — Encounter (HOSPITAL_COMMUNITY): Payer: Self-pay

## 2016-09-24 DIAGNOSIS — I10 Essential (primary) hypertension: Secondary | ICD-10-CM | POA: Insufficient documentation

## 2016-09-24 DIAGNOSIS — G44209 Tension-type headache, unspecified, not intractable: Secondary | ICD-10-CM | POA: Diagnosis not present

## 2016-09-24 DIAGNOSIS — Z91013 Allergy to seafood: Secondary | ICD-10-CM | POA: Insufficient documentation

## 2016-09-24 LAB — URINALYSIS, ROUTINE W REFLEX MICROSCOPIC
BILIRUBIN URINE: NEGATIVE
GLUCOSE, UA: NEGATIVE mg/dL
Hgb urine dipstick: NEGATIVE
KETONES UR: 5 mg/dL — AB
LEUKOCYTES UA: NEGATIVE
Nitrite: NEGATIVE
PH: 6 (ref 5.0–8.0)
Protein, ur: NEGATIVE mg/dL
Specific Gravity, Urine: 1.011 (ref 1.005–1.030)

## 2016-09-24 LAB — BASIC METABOLIC PANEL
Anion gap: 9 (ref 5–15)
BUN: 7 mg/dL (ref 6–20)
CHLORIDE: 104 mmol/L (ref 101–111)
CO2: 22 mmol/L (ref 22–32)
Calcium: 9.6 mg/dL (ref 8.9–10.3)
Creatinine, Ser: 1.03 mg/dL — ABNORMAL HIGH (ref 0.44–1.00)
GFR calc Af Amer: >60 mL/min (ref >60)
GFR calc non Af Amer: >60 mL/min (ref >60)
GLUCOSE: 93 mg/dL (ref 65–99)
POTASSIUM: 3.8 mmol/L (ref 3.5–5.1)
Sodium: 135 mmol/L (ref 135–145)

## 2016-09-24 LAB — CBC
HEMATOCRIT: 43.4 % (ref 36.0–46.0)
Hemoglobin: 14.6 g/dL (ref 12.0–15.0)
MCH: 30.9 pg (ref 26.0–34.0)
MCHC: 33.6 g/dL (ref 30.0–36.0)
MCV: 91.9 fL (ref 78.0–100.0)
Platelets: 253 10*3/uL (ref 150–400)
RBC: 4.72 MIL/uL (ref 3.87–5.11)
RDW: 13.2 % (ref 11.5–15.5)
WBC: 3.9 10*3/uL — ABNORMAL LOW (ref 4.0–10.5)

## 2016-09-24 LAB — CBG MONITORING, ED: Glucose-Capillary: 86 mg/dL (ref 65–99)

## 2016-09-24 MED ORDER — SODIUM CHLORIDE 0.9 % IV BOLUS (SEPSIS)
1000.0000 mL | Freq: Once | INTRAVENOUS | Status: AC
  Administered 2016-09-24: 1000 mL via INTRAVENOUS

## 2016-09-24 MED ORDER — KETOROLAC TROMETHAMINE 30 MG/ML IJ SOLN
30.0000 mg | Freq: Once | INTRAMUSCULAR | Status: AC
  Administered 2016-09-24: 30 mg via INTRAVENOUS
  Filled 2016-09-24: qty 1

## 2016-09-24 MED ORDER — METOCLOPRAMIDE HCL 5 MG/ML IJ SOLN
10.0000 mg | Freq: Once | INTRAMUSCULAR | Status: AC
  Administered 2016-09-24: 10 mg via INTRAVENOUS
  Filled 2016-09-24: qty 2

## 2016-09-24 NOTE — ED Notes (Signed)
Patient transported to CT 

## 2016-09-24 NOTE — ED Notes (Signed)
Attempted to ambulate pt to the restroom, pt unable to make it all the way to the restroom due to dizziness. Pt helped back to bed, unable to provide sample at this time

## 2016-09-24 NOTE — ED Provider Notes (Signed)
[facility] DEPT Provider Note   CSN: [id number] Arrival date & time: 09/24/16  1726     History   Chief Complaint Chief Complaint  Patient presents with  . Dizziness  . Headache    HPI Victoria George is a 41 y.o. female.  HPI Patient has been in police custody since approximately one in the morning.  She presents the emergency department from jail with complaints of left-sided headache that is sharp and radiates from her face towards the back of her head and neck.  She denies chest pain shortness breath.  She reports some dizziness and lightheadedness when standing.  Per the report from jail she was hypertensive despite her home hydrochlorothiazide.  She is brought to the ER for further evaluation.  At this time the patient is without complaints except for left-sided headache.  No neck pain.  No injury or trauma to her head.  Denies weakness of her arms or legs.   Past Medical History:  Diagnosis Date  . Asthmatic bronchitis   . History of gastritis    12/ 2012  . History of ovarian cyst   . Hypertension    PER PT BP HAS IMPROVED AND TAKEN OFF MEDICATIONS AND IS WATCHING DIET  . Intramural leiomyoma of uterus   . Iron deficiency anemia   . Wears glasses     Patient Active Problem List   Diagnosis Date Noted  . UTI (lower urinary tract infection) 02/18/2014  . Gastritis 03/04/2011  . Musculoskeletal pain 03/04/2011  . Nausea & vomiting 03/02/2011  . HTN (hypertension) 03/02/2011  . Hypokalemia 03/02/2011  . Anemia 03/02/2011  . Abdominal pain, epigastric 01/29/2011    Past Surgical History:  Procedure Laterality Date  . CARDIOVASCULAR STRESS TEST    . CHOLECYSTECTOMY  02/01/2011   Procedure: LAPAROSCOPIC CHOLECYSTECTOMY WITH INTRAOPERATIVE CHOLANGIOGRAM;  Surgeon: Odis Hollingshead, MD;  Location: [facility];  Service: General;  Laterality: N/A;  Needs IOC and C-arm  . DIAGNOSTIC LAPAROSCOPY  07/2007   ?Lysis Adhesions for obstructive right fallopian tube  .  ESOPHAGOGASTRODUODENOSCOPY  01/30/2011   Procedure: ESOPHAGOGASTRODUODENOSCOPY (EGD);  Surgeon: Zenovia Jarred, MD;  Location: [facility];  Service: Gastroenterology;  Laterality: N/A;  . ESOPHAGOGASTRODUODENOSCOPY  03/03/2011   Procedure: ESOPHAGOGASTRODUODENOSCOPY (EGD);  Surgeon: Zenovia Jarred, MD;  Location: [facility];  Service: Gastroenterology;  Laterality: N/A;  . HYSTEROSCOPY N/A 03/22/2016   Procedure: HYSTEROSCOPY, POLYPECTOMY;  Surgeon: Governor Specking, MD;  Location: [facility];  Service: Gynecology;  Laterality: N/A;  . LAPAROSCOPIC GELPORT ASSISTED MYOMECTOMY N/A 03/22/2016   Procedure: LAPAROSCOPIC GELPORT ASSISTED MYOMECTOMY, LYSIS OF ADHESIONS, CHROMOPERTUBATION;  Surgeon: Governor Specking, MD;  Location: [facility];  Service: Gynecology;  Laterality: N/A;  . TRANSTHORACIC ECHOCARDIOGRAM      OB History    Gravida Para Term Preterm AB Living   0             SAB TAB Ectopic Multiple Live Births                   Home Medications    Prior to Admission medications   Medication Sig Start Date End Date Taking? Authorizing Provider  albuterol (PROVENTIL HFA;VENTOLIN HFA) 108 (90 BASE) MCG/ACT inhaler Inhale 2 puffs into the lungs every 6 (six) hours as needed for wheezing or shortness of breath.    [provider]  budesonide-formoterol (SYMBICORT) 80-4.5 MCG/ACT inhaler Inhale 2 puffs into the lungs every morning.     [provider]  Cholecalciferol (  VITAMIN D3) 2000 units TABS Take 1 tablet by mouth daily.    [provider]  ferrous sulfate 325 (65 FE) MG EC tablet Take 325 mg by mouth daily with breakfast.    [provider]  folic acid (FOLVITE) 1 MG tablet Take 1 tablet (1 mg total) by mouth daily. 08/13/14   Shelly Bombard, MD  Multiple Vitamin (MULTIVITAMIN WITH MINERALS) TABS tablet Take 1 tablet by mouth daily.    [provider]  naproxen sodium (ANAPROX) 220 MG tablet Take 220 mg by mouth  2 (two) times daily as needed (pain).     [provider]  nitrofurantoin, macrocrystal-monohydrate, (MACROBID) 100 MG capsule Take 1 capsule (100 mg total) by mouth 2 (two) times daily. 08/21/16   Starr Lake, CNM  Probiotic Product (ALIGN) 4 MG CAPS Take 1 capsule by mouth daily.    [provider]    Family History Family History  Problem Relation Age of Onset  . Hypertension Mother   . Colon cancer Maternal Grandmother   . Diabetes Maternal Grandmother   . Kidney disease Maternal Grandmother   . Diabetes Brother   . Diabetes Paternal Grandmother   . Diverticulosis Paternal Grandmother   . Anesthesia problems Neg Hx   . Malignant hyperthermia Neg Hx   . Pseudochol deficiency Neg Hx     Social History Social History  Substance Use Topics  . Smoking status: Never Smoker  . Smokeless tobacco: Never Used  . Alcohol use Yes     Comment: occasionally     Allergies   Phenergan [promethazine]; Shellfish allergy; Codeine; Oxycodone; Tramadol; and Hydrocodone   Review of Systems Review of Systems  All other systems reviewed and are negative.    Physical Exam Updated Vital Signs BP (!) 165/88   Pulse 86   Temp 98 F (36.7 C) (Oral)   Resp 15   Ht 5' 1"  (1.549 m)   Wt 88.5 kg (195 lb)   SpO2 97%   BMI 36.84 kg/m   Physical Exam  Constitutional: She is oriented to person, place, and time. She appears well-developed and well-nourished. No distress.  HENT:  Head: Normocephalic and atraumatic.  Eyes: EOM are normal. Pupils are equal, round, and reactive to light.  Neck: Normal range of motion.  Cardiovascular: Normal rate, regular rhythm and normal heart sounds.   Pulmonary/Chest: Effort normal and breath sounds normal.  Abdominal: Soft. She exhibits no distension. There is no tenderness.  Musculoskeletal: Normal range of motion.  Neurological: She is alert and oriented to person, place, and time.  5/5 strength in major muscle groups  of  bilateral upper and lower extremities. Speech normal. No facial asymetry.   Skin: Skin is warm and dry.  Psychiatric: She has a normal mood and affect. Judgment normal.  Nursing note and vitals reviewed.    ED Treatments / Results  Labs (all labs ordered are listed, but only abnormal results are displayed) Labs Reviewed  BASIC METABOLIC PANEL - Abnormal; Notable for the following:       Result Value   Creatinine, Ser 1.03 (*)    All other components within normal limits  CBC - Abnormal; Notable for the following:    WBC 3.9 (*)    All other components within normal limits  URINALYSIS, ROUTINE W REFLEX MICROSCOPIC - Abnormal; Notable for the following:    Ketones, ur 5 (*)    All other components within normal limits  CBG MONITORING, ED  EKG  EKG Interpretation  Date/Time:  Sunday September 24 2016 17:42:18 EDT Ventricular Rate:  101 PR Interval:    QRS Duration: 85 QT Interval:  356 QTC Calculation: 462 R Axis:   90 Text Interpretation:  Sinus tachycardia LAE, consider biatrial enlargement Borderline right axis deviation No significant change was found Confirmed by Jola Schmidt [phone]) on 09/24/2016 6:32:44 PM       Radiology Ct Head Wo Contrast  Result Date: 09/24/2016 CLINICAL DATA:  Acute headache, hypertension EXAM: CT HEAD WITHOUT CONTRAST TECHNIQUE: Contiguous axial images were obtained from the base of the skull through the vertex without intravenous contrast. COMPARISON:  None. FINDINGS: Brain: No acute intracranial abnormality. Specifically, no hemorrhage, hydrocephalus, mass lesion, acute infarction, or significant intracranial injury. Vascular: No hyperdense vessel or unexpected calcification. Skull: No acute calvarial abnormality. Sinuses/Orbits: Visualized paranasal sinuses and mastoids clear. Orbital soft tissues unremarkable. Other: None IMPRESSION: No acute intracranial abnormality. Electronically Signed   By: Rolm Baptise M.D.   On: 09/24/2016 18:17     Procedures Procedures (including critical care time)  Medications Ordered in ED Medications  sodium chloride 0.9 % bolus 1,000 mL (0 mLs Intravenous Stopped 09/24/16 2000)  ketorolac (TORADOL) 30 MG/ML injection 30 mg (30 mg Intravenous Given 09/24/16 1855)  metoCLOPramide (REGLAN) injection 10 mg (10 mg Intravenous Given 09/24/16 1857)     Initial Impression / Assessment and Plan / ED Course  I have reviewed the triage vital signs and the nursing notes.  Pertinent labs & imaging results that were available during my care of the patient were reviewed by me and considered in my medical decision making (see chart for details).     Patient feels better this time.  Likely mild dehydration and headache.  Head CT normal.  Discharge in police custody  Final Clinical Impressions(s) / ED Diagnoses   Final diagnoses:  Acute non intractable tension-type headache    New Prescriptions Discharge Medication List as of 09/24/2016  6:59 PM       Jola Schmidt, MD 09/24/16 2319

## 2016-09-24 NOTE — ED Notes (Signed)
At shift change pt c/o of not being able to catch her breath. Off going RN and oncoming RN went in and assessed patient. Lung sounds clear. Pt on full cardiac monitor

## 2016-09-24 NOTE — ED Triage Notes (Signed)
[facility].o sudden onset left sided headache that starts on her face and radiates to the back of her head and neck, pt also c.o dizziness upon standing. Nurse at jail reports pt was hypertensive and gave her HCTZ. Pt alert and orientedx4, 22G in right hand, sinus tach 110 BP 144/108, 98% on room air, nad.

## 2016-09-24 NOTE — ED Notes (Signed)
Pt understood dc material. NAD Noted 

## 2017-02-20 ENCOUNTER — Other Ambulatory Visit: Payer: Self-pay | Admitting: Obstetrics

## 2017-02-20 DIAGNOSIS — Z139 Encounter for screening, unspecified: Secondary | ICD-10-CM

## 2017-02-28 ENCOUNTER — Encounter: Payer: Self-pay | Admitting: Allergy

## 2017-02-28 ENCOUNTER — Ambulatory Visit (INDEPENDENT_AMBULATORY_CARE_PROVIDER_SITE_OTHER): Payer: PRIVATE HEALTH INSURANCE | Admitting: Allergy

## 2017-02-28 VITALS — BP 120/70 | HR 72 | Resp 16 | Ht 62.0 in | Wt 195.4 lb

## 2017-02-28 DIAGNOSIS — L508 Other urticaria: Secondary | ICD-10-CM | POA: Diagnosis not present

## 2017-02-28 DIAGNOSIS — T7800XA Anaphylactic reaction due to unspecified food, initial encounter: Secondary | ICD-10-CM

## 2017-02-28 DIAGNOSIS — H1010 Acute atopic conjunctivitis, unspecified eye: Secondary | ICD-10-CM | POA: Diagnosis not present

## 2017-02-28 DIAGNOSIS — J4540 Moderate persistent asthma, uncomplicated: Secondary | ICD-10-CM | POA: Diagnosis not present

## 2017-02-28 DIAGNOSIS — J309 Allergic rhinitis, unspecified: Secondary | ICD-10-CM

## 2017-02-28 MED ORDER — BUDESONIDE-FORMOTEROL FUMARATE 80-4.5 MCG/ACT IN AERO: 2.0000 | INHALATION_SPRAY | Freq: Every morning | RESPIRATORY_TRACT | 5 refills | Status: DC

## 2017-02-28 MED ORDER — EPINEPHRINE 0.3 MG/0.3ML IJ SOAJ: 0.3000 mg | Freq: Once | INTRAMUSCULAR | 1 refills | Status: AC

## 2017-02-28 MED ORDER — MONTELUKAST SODIUM 10 MG PO TABS: 10.0000 mg | ORAL_TABLET | Freq: Every day | ORAL | 5 refills | Status: DC

## 2017-02-28 MED ORDER — ALBUTEROL SULFATE HFA 108 (90 BASE) MCG/ACT IN AERS: 2.0000 | INHALATION_SPRAY | Freq: Four times a day (QID) | RESPIRATORY_TRACT | 3 refills | Status: DC | PRN

## 2017-02-28 NOTE — Progress Notes (Signed)
[place] [id number] Dept: [phone]  FAMILY NURSE PRACTITIONER NEW PATIENT NOTE  Patient ID: Victoria George, female    DOB: May 30, 1975  Age: 41 y.o. MRN: [id number] Date of Office Visit: 02/28/2017 Referring provider: Carol Ada, MD Victoria George, Victoria George [id number]    Chief Complaint: Allergic Reaction and Allergy Testing  HPI Victoria George is a 41 year old female patient who presents to the clinic for evaluation of hives, swelling, and asthma. She reports her first episode of hives and lip swelling was in September 2018 while in [place]. The hives started as an itch on her neck with individual raised red spots that began to spread and became red, raised and eventually confluent. These resolved within 24 hours with a dose of liquid Benadryl. She reports she also experienced facial swelling at that time involving half her top lip, her bottom lip and the right side of her face which resolved 2 days after taking Benadryl.  Since that time, she has had about 7 separate episodes of hives and facial swelling.  She reports an episode of lip swelling and hives after drinking a smoothie that contained kale, spinach, pineapple, and guava.  She is currently eating a varied diet including eggs, soy, wheat, kale, and spinach.  She is avoiding shellfish, pineapple, guava, and orange juice.  She reports dairy sometimes makes her back feel itchy but she does continue to eat cheese on pizza.  She is avoiding beef and pork because she does not like these items.  She reports throat closing and tongue swelling with no hives after eating shellfish several years ago for which she did not receive treatment or an epinephrine pen.  Victoria George reports her asthma is not well controlled.  She reports having a cough at night which has worsened over the last 3 weeks, some shortness of breath, some wheezing, and waking up at night due to cough and wheezing.  She has not needed  to go to the emergency department or urgent care nor has she needed antibiotics or prednisone to control her asthma symptoms.  She currently uses Symbicort 80 as needed and albuterol rescue inhaler as needed.  She has been using the Symbicort daily for the last couple of weeks since the weather has changed.  Her asthma has been managed by Victoria George at [facility] and she has not visited a pulmonologist in the past.  Allergic rhinitis is reported as worse in the spring and summer resulting in nasal congestion, clear nasal drainage, and itchy watery eyes.  She does use the allergy eyedrop Zaditor as needed and does not take a daily antihistamine.  Victoria George denies a history of eczema.  Family history is positive for seasonal allergies eczema and asthma in her brothers.   Review of Systems  Constitutional: Negative.   HENT:       Red itchy watery eyes reported.  Nasal congestion and runny nose reported  Eyes:       She watery eyes reported  Respiratory:       Wheezing and shortness of breath reported.   Cardiovascular: Negative.   Gastrointestinal: Negative.   Genitourinary: Negative.   Musculoskeletal: Negative.   Skin:       Raised red pruritic hives reported.  Facial swelling reported  Neurological: Negative.   Psychiatric/Behavioral: Negative.     Outpatient Encounter Medications as of 02/28/2017  Medication Sig  . albuterol (PROVENTIL HFA;VENTOLIN HFA) 108 (90 Base) MCG/ACT inhaler  Inhale 2 puffs into the lungs every 6 (six) hours as needed for wheezing or shortness of breath.  . budesonide-formoterol (SYMBICORT) 80-4.5 MCG/ACT inhaler Inhale 2 puffs into the lungs every morning.  . Cholecalciferol (VITAMIN D3) 2000 units TABS Take 1 tablet by mouth daily.  . ferrous sulfate 325 (65 FE) MG EC tablet Take 325 mg by mouth daily with breakfast.  . folic acid (FOLVITE) 1 MG tablet Take 1 tablet (1 mg total) by mouth daily.  . hydrochlorothiazide (HYDRODIURIL) 25 MG tablet Take  25 mg by mouth daily.  . Multiple Vitamin (MULTIVITAMIN WITH MINERALS) TABS tablet Take 1 tablet by mouth daily.  . Probiotic Product (ALIGN) 4 MG CAPS Take 1 capsule by mouth daily.  . [DISCONTINUED] albuterol (PROVENTIL HFA;VENTOLIN HFA) 108 (90 BASE) MCG/ACT inhaler Inhale 2 puffs into the lungs every 6 (six) hours as needed for wheezing or shortness of breath.  . [DISCONTINUED] budesonide-formoterol (SYMBICORT) 80-4.5 MCG/ACT inhaler Inhale 2 puffs into the lungs every morning.   Marland Kitchen EPINEPHrine (AUVI-Q) 0.3 mg/0.3 mL IJ SOAJ injection Inject 0.3 mLs (0.3 mg total) into the muscle once for 1 dose.  . montelukast (SINGULAIR) 10 MG tablet Take 1 tablet (10 mg total) by mouth at bedtime.  . naproxen sodium (ANAPROX) 220 MG tablet Take 220 mg by mouth 2 (two) times daily as needed (pain).   . nitrofurantoin, macrocrystal-monohydrate, (MACROBID) 100 MG capsule Take 1 capsule (100 mg total) by mouth 2 (two) times daily.   No facility-administered encounter medications on file as of 02/28/2017.      Drug Allergies:  Allergies  Allergen Reactions  . Phenergan [Promethazine] Shortness Of Breath and Palpitations  . Shellfish Allergy Anaphylaxis    ALL SHELLFISH  . Codeine Hives  . Oxycodone Other (See Comments)    Percocet-- "insomnia"  . Tramadol Other (See Comments)    Ultram-- " manic episode"  . Hydrocodone Hives and Itching    vicodin    Environmental History: Victoria George lives in an 43 year old house with no concern for water damage or mildew.  The flooring is carpeting throughout.  Heating is electric and cooling is central.  There are no animals located in the home.  There are no plastic dust mite free covers for the bed or pillows.  There is no concern for tobacco smoke, fumes, chemicals, or dust in the home.  Family History: Victoria George's family history includes Colon cancer in her maternal grandmother; Diabetes in her brother, maternal grandmother, and paternal grandmother; Diverticulosis in  her paternal grandmother; Hypertension in her mother; Kidney disease in her maternal grandmother..  Physical Exam: BP 120/70   Pulse 72   Resp 16   Ht 5' 2"  (1.575 m)   Wt 195 lb 6.4 oz (88.6 kg)   BMI 35.74 kg/m    Physical Exam  Constitutional: She is oriented to person, place, and time. She appears well-developed and well-nourished.  HENT:  Head: Normocephalic.  Right Ear: External ear normal.  Left Ear: External ear normal.  Nose: Nose normal.  Mouth/Throat: Oropharynx is clear and moist.  Eyes: Conjunctivae are normal.  Neck: Normal range of motion. Neck supple.  Cardiovascular: Normal rate, regular rhythm and normal heart sounds.  S1-S2 normal.  Regular heart rate and rhythm.  No murmurs noted  Pulmonary/Chest: Effort normal and breath sounds normal.  Clear to auscultation  Abdominal: Soft. Bowel sounds are normal.  Musculoskeletal: Normal range of motion.  Neurological: She is alert and oriented to person, place, and time.  Skin: Skin  is warm and dry.  No hives or swelling present on exam today  Psychiatric: She has a normal mood and affect. Her behavior is normal. Judgment and thought content normal.    Diagnostics: FEV1 1.66, FVC 1.98.  Predicted FEV1 2.29, predicted FVC 2.79.  Post bronchodilator FEV1 1.82, FVC 2.12.  Spirometry is within normal range at slight improvement in spirometry after bronchodilator therapy.    Assessment  Assessment and Plan: 1. Moderate persistent asthma without complication   2. Allergic rhinoconjunctivitis   3. Anaphylactic shock due to food, initial encounter   4. Chronic urticaria     Meds ordered this encounter  Medications  . budesonide-formoterol (SYMBICORT) 80-4.5 MCG/ACT inhaler    Sig: Inhale 2 puffs into the lungs every morning.    Dispense:  1 Inhaler    Refill:  5  . montelukast (SINGULAIR) 10 MG tablet    Sig: Take 1 tablet (10 mg total) by mouth at bedtime.    Dispense:  30 tablet    Refill:  5  . EPINEPHrine  (AUVI-Q) 0.3 mg/0.3 mL IJ SOAJ injection    Sig: Inject 0.3 mLs (0.3 mg total) into the muscle once for 1 dose.    Dispense:  2 Device    Refill:  1  . albuterol (PROVENTIL HFA;VENTOLIN HFA) 108 (90 Base) MCG/ACT inhaler    Sig: Inhale 2 puffs into the lungs every 6 (six) hours as needed for wheezing or shortness of breath.    Dispense:  18 g    Refill:  3    1. Moderate persistent asthma without complication - Begin taking Symbicort 80- 2 puffs twice a day - Continue albuterol inhaler every 4 hours as needed for wheeze or shortness of breath - Begin montelukast 10 mg daily  2. Allergic rhinoconjunctivitis - Begin Zyrtec 10 mg daily as needed for nasal symptoms - Flonase as needed for stuffy nose -  3. Anaphylactic shock due to food, initial encounter - Your skin testing was negative for food allergies today. To complete testing we will draw blood work. These labs will be back in about 1 week. We will call you with results. - Continue to avoid shellfish, pineapple, and cheese until lab results are returned  4. Chronic urticaria - Zyrtec as above - For breakthrough itching begin Zantac 150 mg twice a day  Follow up with lab results. Then follow up in 2 months   Thank you for the opportunity to care for this patient.  Please do not hesitate to contact me with questions.  Gareth Morgan, FNP [facility]: I performed/discussed the history and physical examination of the patient as well as management with NP Santosha Jividen. I reviewed the NP's note and agree with the documented findings and plan of care with following additions/exceptions: she has not been consistent with symbicort use and have advised she take this twice a day for improved asthma control.  Will also have her start singulair for asthma and allergy control.  She reports being unable to use nasal sprays thus zyrtec and singulair will help to control nasal symptoms.  Will be obtaining  additional labs today including serum IgE to foods as well as chronic urticaria labs.    Prudy Feeler, MD [facility]

## 2017-02-28 NOTE — Patient Instructions (Addendum)
1. Moderate persistent asthma without complication - Begin taking Symbicort 80- 2 puffs twice a day - Continue albuterol inhaler every 4 hours as needed for wheeze or shortness of breath - Begin montelukast 10 mg daily Asthma control goals:   Full participation in all desired activities (may need albuterol before activity)  Albuterol use two time or less a week on average (not counting use with activity)  Cough interfering with sleep two time or less a month  Oral steroids no more than once a year  No hospitalizations   2. Allergic rhinoconjunctivitis Your skin testing was positive to tree, weed, and grass pollens, molds, cat, dog, dust mite, and cockroach.  - Avoidance measures have been provided today. - Begin Zyrtec 10 mg daily as needed for nasal symptoms - Flonase as needed for stuffy nose - Continue allergy eye drops as needed If your symptoms are not controlled with these medications, we can further discuss allergy shots -  3. Anaphylactic shock due to food, initial encounter - Your skin testing was negative for food allergies today. To complete testing we will draw blood work. These labs will be back in about 1 week. We will call you with results. - Continue to avoid shellfish, pineapple, and cheese until lab results are returned  4. Chronic urticaria - Zyrtec as above - For breakthrough itching begin Zantac 150 mg twice a day  Follow up with lab results. Then follow up in 2 months

## 2017-03-01 DIAGNOSIS — T7800XA Anaphylactic reaction due to unspecified food, initial encounter: Secondary | ICD-10-CM | POA: Insufficient documentation

## 2017-03-01 DIAGNOSIS — L508 Other urticaria: Secondary | ICD-10-CM | POA: Insufficient documentation

## 2017-03-01 DIAGNOSIS — J4540 Moderate persistent asthma, uncomplicated: Secondary | ICD-10-CM | POA: Insufficient documentation

## 2017-03-01 DIAGNOSIS — H1010 Acute atopic conjunctivitis, unspecified eye: Secondary | ICD-10-CM | POA: Insufficient documentation

## 2017-03-01 LAB — TRYPTASE: Tryptase: 5.3 ug/L (ref 2.2–13.2)

## 2017-03-09 ENCOUNTER — Telehealth: Payer: Self-pay | Admitting: Allergy

## 2017-03-09 NOTE — Telephone Encounter (Signed)
I spoke with [facility] and verified some information. Prescription was received. They spoke with patient on 03-06-17 and set up delivery for 03-14-17. She says that she though that she had spoken to them well before then but it must have slipped her mind. She was okay with this information.

## 2017-03-09 NOTE — Telephone Encounter (Signed)
Patient states she called the 1-800 number for her EPI-PEN over a week ago and hasn't heard or received any medication Will it be sent to her or the office Please call patient to answer any questions

## 2017-03-16 ENCOUNTER — Other Ambulatory Visit: Payer: Self-pay

## 2017-03-16 ENCOUNTER — Other Ambulatory Visit: Payer: Self-pay | Admitting: Obstetrics

## 2017-03-16 DIAGNOSIS — B9689 Other specified bacterial agents as the cause of diseases classified elsewhere: Secondary | ICD-10-CM

## 2017-03-16 DIAGNOSIS — N760 Acute vaginitis: Principal | ICD-10-CM

## 2017-03-16 MED ORDER — EPINEPHRINE 0.3 MG/0.3ML IJ SOAJ: 0.3000 mg | Freq: Once | INTRAMUSCULAR | 2 refills | Status: AC

## 2017-03-16 MED ORDER — ALBUTEROL SULFATE HFA 108 (90 BASE) MCG/ACT IN AERS: 2.0000 | INHALATION_SPRAY | Freq: Four times a day (QID) | RESPIRATORY_TRACT | 0 refills | Status: DC | PRN

## 2017-03-16 MED ORDER — MONTELUKAST SODIUM 10 MG PO TABS: 10.0000 mg | ORAL_TABLET | Freq: Every day | ORAL | 0 refills | Status: DC

## 2017-03-16 MED ORDER — IPRATROPIUM BROMIDE 0.02 % IN SOLN: 0.5000 mg | Freq: Four times a day (QID) | RESPIRATORY_TRACT | 12 refills | Status: DC

## 2017-03-16 MED ORDER — BUDESONIDE-FORMOTEROL FUMARATE 160-4.5 MCG/ACT IN AERO: 2.0000 | INHALATION_SPRAY | Freq: Two times a day (BID) | RESPIRATORY_TRACT | 0 refills | Status: DC

## 2017-03-16 NOTE — Telephone Encounter (Signed)
Last AEX 2015, no appt in past year-Please advise

## 2017-03-19 ENCOUNTER — Ambulatory Visit
Admission: RE | Admit: 2017-03-19 | Discharge: 2017-03-19 | Disposition: A | Discharge status: Home / Self Care | Payer: PRIVATE HEALTH INSURANCE | Source: Ambulatory Visit | Attending: Obstetrics | Admitting: Obstetrics

## 2017-03-19 DIAGNOSIS — Z139 Encounter for screening, unspecified: Secondary | ICD-10-CM

## 2017-03-21 LAB — COMPREHENSIVE METABOLIC PANEL
A/G RATIO: 1.4 (ref 1.2–2.2)
ALBUMIN: 4.3 g/dL (ref 3.5–5.5)
ALT: 24 IU/L (ref 0–32)
AST: 26 IU/L (ref 0–40)
Alkaline Phosphatase: 86 IU/L (ref 39–117)
BILIRUBIN TOTAL: 0.3 mg/dL (ref 0.0–1.2)
BUN / CREAT RATIO: 14 (ref 9–23)
BUN: 12 mg/dL (ref 6–24)
CALCIUM: 9.4 mg/dL (ref 8.7–10.2)
CHLORIDE: 97 mmol/L (ref 96–106)
CO2: 26 mmol/L (ref 20–29)
Creatinine, Ser: 0.87 mg/dL (ref 0.57–1.00)
GFR, EST AFRICAN AMERICAN: 94 mL/min/{1.73_m2} (ref >59)
GFR, EST NON AFRICAN AMERICAN: 82 mL/min/{1.73_m2} (ref >59)
GLUCOSE: 89 mg/dL (ref 65–99)
Globulin, Total: 3 g/dL (ref 1.5–4.5)
Potassium: 3.2 mmol/L — ABNORMAL LOW (ref 3.5–5.2)
Sodium: 137 mmol/L (ref 134–144)
TOTAL PROTEIN: 7.3 g/dL (ref 6.0–8.5)

## 2017-03-21 LAB — CBC WITH DIFFERENTIAL/PLATELET
BASOS: 1 %
Basophils Absolute: 0 10*3/uL (ref 0.0–0.2)
EOS (ABSOLUTE): 0 10*3/uL (ref 0.0–0.4)
Eos: 1 %
HEMOGLOBIN: 13.1 g/dL (ref 11.1–15.9)
Hematocrit: 38.2 % (ref 34.0–46.6)
IMMATURE GRANS (ABS): 0 10*3/uL (ref 0.0–0.1)
IMMATURE GRANULOCYTES: 0 %
LYMPHS: 45 %
Lymphocytes Absolute: 1.7 10*3/uL (ref 0.7–3.1)
MCH: 31.4 pg (ref 26.6–33.0)
MCHC: 34.3 g/dL (ref 31.5–35.7)
MCV: 92 fL (ref 79–97)
MONOCYTES: 6 %
Monocytes Absolute: 0.2 10*3/uL (ref 0.1–0.9)
NEUTROS ABS: 1.8 10*3/uL (ref 1.4–7.0)
NEUTROS PCT: 47 %
PLATELETS: 385 10*3/uL — AB (ref 150–379)
RBC: 4.17 x10E6/uL (ref 3.77–5.28)
RDW: 13.4 % (ref 12.3–15.4)
WBC: 3.8 10*3/uL (ref 3.4–10.8)

## 2017-03-21 LAB — ALLERGEN PROFILE, SHELLFISH
F023-IGE CRAB: 0.22 kU/L — AB
F080-IgE Lobster: 0.16 kU/L — AB
F290-IgE Oyster: <0.1 kU/L
Scallop IgE: <0.1 kU/L
Shrimp IgE: 1.87 kU/L — AB

## 2017-03-21 LAB — GUAVA (PSIDIUM GUAJAVA) IGE: CLASS INTERPRETATION: 0

## 2017-03-21 LAB — ALPHA-GAL PANEL
Beef (Bos spp) IgE: 0.12 kU/L (ref <0.35)
Class Interpretation: 0
Lamb/Mutton (Ovis spp) IgE: <0.1 kU/L (ref <0.35)
PORK CLASS INTERPRETATION: 0
Pork (Sus spp) IgE: <0.1 kU/L (ref <0.35)

## 2017-03-21 LAB — TSH: TSH: 1.99 u[IU]/mL (ref 0.450–4.500)

## 2017-03-21 LAB — PANEL 603848
F076-IGE ALPHA LACTALBUMIN: 0.24 kU/L — AB
F077-IGE BETA LACTOGLOBULIN: 0.22 kU/L — AB

## 2017-03-21 LAB — CHRONIC URTICARIA: cu index: <1 (ref <10)

## 2017-03-21 LAB — ALLERGEN, PINEAPPLE, F210: Pineapple IgE: 0.25 kU/L — AB

## 2017-03-21 LAB — IGE MILK W/ COMPONENT REFLEX: MILK IGE: 0.39 kU/L — AB

## 2017-03-21 LAB — ALLERGEN, ORANGE F33: Orange: 0.13 kU/L — AB

## 2017-03-21 LAB — T4: T4 TOTAL: 7.1 ug/dL (ref 4.5–12.0)

## 2017-04-02 ENCOUNTER — Encounter: Payer: Self-pay | Admitting: Obstetrics

## 2017-04-02 ENCOUNTER — Other Ambulatory Visit: Payer: Self-pay

## 2017-04-02 ENCOUNTER — Ambulatory Visit (INDEPENDENT_AMBULATORY_CARE_PROVIDER_SITE_OTHER): Payer: PRIVATE HEALTH INSURANCE | Admitting: Obstetrics

## 2017-04-02 VITALS — BP 140/98 | HR 92 | Ht 62.0 in | Wt 199.3 lb

## 2017-04-02 DIAGNOSIS — Z1151 Encounter for screening for human papillomavirus (HPV): Secondary | ICD-10-CM

## 2017-04-02 DIAGNOSIS — Z113 Encounter for screening for infections with a predominantly sexual mode of transmission: Secondary | ICD-10-CM

## 2017-04-02 DIAGNOSIS — D251 Intramural leiomyoma of uterus: Secondary | ICD-10-CM

## 2017-04-02 DIAGNOSIS — Z124 Encounter for screening for malignant neoplasm of cervix: Secondary | ICD-10-CM

## 2017-04-02 DIAGNOSIS — Z01419 Encounter for gynecological examination (general) (routine) without abnormal findings: Secondary | ICD-10-CM

## 2017-04-02 DIAGNOSIS — I10 Essential (primary) hypertension: Secondary | ICD-10-CM

## 2017-04-02 DIAGNOSIS — Z3009 Encounter for other general counseling and advice on contraception: Secondary | ICD-10-CM

## 2017-04-02 NOTE — Progress Notes (Signed)
Subjective:        Victoria George is a 42 y.o. female here for a routine exam.  Current complaints: None.    Personal health questionnaire:  Is patient Ashkenazi Jewish, have a family history of breast and/or ovarian cancer: yes, aunt, in remission Is there a family history of uterine cancer diagnosed at age < 44, gastrointestinal cancer, urinary tract cancer, family member who is a Field seismologist syndrome-associated carrier: no Is the patient overweight and hypertensive, family history of diabetes, personal history of gestational diabetes, preeclampsia or PCOS: no Is patient over 49, have PCOS,  family history of premature CHD under age 51, diabetes, smoke, have hypertension or peripheral artery disease:  no At any time, has a partner hit, kicked or otherwise hurt or frightened you?: no Over the past 2 weeks, have you felt down, depressed or hopeless?: no Over the past 2 weeks, have you felt little interest or pleasure in doing things?:no   Gynecologic History Patient's last menstrual period was 03/20/2017 (exact date). Contraception: none Last Pap: 2017. Results were: normal Last mammogram: 2018. Results were: normal  Obstetric History OB History  Gravida Para Term Preterm AB Living  0            SAB TAB Ectopic Multiple Live Births                   Past Medical History:  Diagnosis Date  . Asthmatic bronchitis   . History of gastritis    12/ 2012  . History of ovarian cyst   . Hypertension    PER PT BP HAS IMPROVED AND TAKEN OFF MEDICATIONS AND IS WATCHING DIET  . Intramural leiomyoma of uterus   . Iron deficiency anemia   . Wears glasses     Past Surgical History:  Procedure Laterality Date  . CARDIOVASCULAR STRESS TEST    . CHOLECYSTECTOMY  02/01/2011   Procedure: LAPAROSCOPIC CHOLECYSTECTOMY WITH INTRAOPERATIVE CHOLANGIOGRAM;  Surgeon: Odis Hollingshead, MD;  Location: [facility];  Service: General;  Laterality: N/A;  Needs IOC and C-arm  . DIAGNOSTIC LAPAROSCOPY   07/2007   ?Lysis Adhesions for obstructive right fallopian tube  . ESOPHAGOGASTRODUODENOSCOPY  01/30/2011   Procedure: ESOPHAGOGASTRODUODENOSCOPY (EGD);  Surgeon: Zenovia Jarred, MD;  Location: [facility];  Service: Gastroenterology;  Laterality: N/A;  . ESOPHAGOGASTRODUODENOSCOPY  03/03/2011   Procedure: ESOPHAGOGASTRODUODENOSCOPY (EGD);  Surgeon: Zenovia Jarred, MD;  Location: [facility];  Service: Gastroenterology;  Laterality: N/A;  . HYSTEROSCOPY N/A 03/22/2016   Procedure: HYSTEROSCOPY, POLYPECTOMY;  Surgeon: Governor Specking, MD;  Location: [facility];  Service: Gynecology;  Laterality: N/A;  . LAPAROSCOPIC GELPORT ASSISTED MYOMECTOMY N/A 03/22/2016   Procedure: LAPAROSCOPIC GELPORT ASSISTED MYOMECTOMY, LYSIS OF ADHESIONS, CHROMOPERTUBATION;  Surgeon: Governor Specking, MD;  Location: [facility];  Service: Gynecology;  Laterality: N/A;  . TRANSTHORACIC ECHOCARDIOGRAM       Current Outpatient Medications:  .  albuterol (PROVENTIL HFA;VENTOLIN HFA) 108 (90 Base) MCG/ACT inhaler, Inhale 2 puffs into the lungs every 6 (six) hours as needed for wheezing or shortness of breath., Disp: 18 g, Rfl: 3 .  albuterol (PROVENTIL HFA;VENTOLIN HFA) 108 (90 Base) MCG/ACT inhaler, Inhale 2 puffs into the lungs every 6 (six) hours as needed for wheezing or shortness of breath., Disp: 1 Inhaler, Rfl: 0 .  budesonide-formoterol (SYMBICORT) 160-4.5 MCG/ACT inhaler, Inhale 2 puffs into the lungs 2 (two) times daily., Disp: 1 Inhaler, Rfl: 0 .  budesonide-formoterol (SYMBICORT) 80-4.5 MCG/ACT inhaler, Inhale 2 puffs into the  lungs every morning., Disp: 1 Inhaler, Rfl: 5 .  Cholecalciferol (VITAMIN D3) 2000 units TABS, Take 1 tablet by mouth daily., Disp: , Rfl:  .  ferrous sulfate 325 (65 FE) MG EC tablet, Take 325 mg by mouth daily with breakfast., Disp: , Rfl:  .  folic acid (FOLVITE) 1 MG tablet, Take 1 tablet (1 mg total) by mouth daily., Disp: 90 tablet, Rfl: 3 .   hydrochlorothiazide (HYDRODIURIL) 25 MG tablet, Take 25 mg by mouth daily., Disp: , Rfl:  .  ipratropium (ATROVENT) 0.02 % nebulizer solution, Take 2.5 mLs (0.5 mg total) by nebulization 4 (four) times daily., Disp: 75 mL, Rfl: 12 .  metroNIDAZOLE (METROGEL) 0.75 % vaginal gel, INSERT ONE APPLICATORFUL VAGINALLY TWICE DAILY (Patient not taking: Reported on 04/02/2017), Disp: 70 g, Rfl: 2 .  montelukast (SINGULAIR) 10 MG tablet, Take 1 tablet (10 mg total) by mouth at bedtime., Disp: 30 tablet, Rfl: 5 .  montelukast (SINGULAIR) 10 MG tablet, Take 1 tablet (10 mg total) by mouth at bedtime., Disp: 30 tablet, Rfl: 0 .  Multiple Vitamin (MULTIVITAMIN WITH MINERALS) TABS tablet, Take 1 tablet by mouth daily., Disp: , Rfl:  .  naproxen sodium (ANAPROX) 220 MG tablet, Take 220 mg by mouth 2 (two) times daily as needed (pain). , Disp: , Rfl:  .  nitrofurantoin, macrocrystal-monohydrate, (MACROBID) 100 MG capsule, Take 1 capsule (100 mg total) by mouth 2 (two) times daily. (Patient not taking: Reported on 04/02/2017), Disp: 14 capsule, Rfl: 0 .  Probiotic Product (ALIGN) 4 MG CAPS, Take 1 capsule by mouth daily., Disp: , Rfl:  Allergies  Allergen Reactions  . Phenergan [Promethazine] Shortness Of Breath and Palpitations  . Shellfish Allergy Anaphylaxis    ALL SHELLFISH  . Codeine Hives  . Oxycodone Other (See Comments)    Percocet-- "insomnia"  . Tramadol Other (See Comments)    Ultram-- " manic episode"  . Hydrocodone Hives and Itching    vicodin    Social History   Tobacco Use  . Smoking status: Never Smoker  . Smokeless tobacco: Never Used  Substance Use Topics  . Alcohol use: Yes    Comment: occasionally    Family History  Problem Relation Age of Onset  . Hypertension Mother   . Colon cancer Maternal Grandmother   . Diabetes Maternal Grandmother   . Kidney disease Maternal Grandmother   . Diabetes Brother   . Diabetes Paternal Grandmother   . Diverticulosis Paternal Grandmother   .  Anesthesia problems Neg Hx   . Malignant hyperthermia Neg Hx   . Pseudochol deficiency Neg Hx       Review of Systems  Constitutional: negative for fatigue and weight loss Respiratory: negative for cough and wheezing Cardiovascular: negative for chest pain, fatigue and palpitations Gastrointestinal: negative for abdominal pain and change in bowel habits Musculoskeletal:negative for myalgias Neurological: negative for gait problems and tremors Behavioral/Psych: negative for abusive relationship, depression Endocrine: negative for temperature intolerance    Genitourinary:negative for abnormal menstrual periods, genital lesions, hot flashes, sexual problems and vaginal discharge Integument/breast: negative for breast lump, breast tenderness, nipple discharge and skin lesion(s)    Objective:       BP (!) 140/98   Pulse 92   Ht 5' 2"  (1.575 m)   Wt 199 lb 4.8 oz (90.4 kg)   LMP 03/20/2017 (Exact Date)   BMI 36.45 kg/m  General:   alert  Skin:   no rash or abnormalities  Lungs:   clear to auscultation  bilaterally  Heart:   regular rate and rhythm, S1, S2 normal, no murmur, click, rub or gallop  Breasts:   normal without suspicious masses, skin or nipple changes or axillary nodes  Abdomen:  normal findings: no organomegaly, soft, non-tender and no hernia  Pelvis:  External genitalia: normal general appearance Urinary system: urethral meatus normal and bladder without fullness, nontender Vaginal: normal without tenderness, induration or masses Cervix: normal appearance Adnexa: normal bimanual exam Uterus: anteverted and non-tender, normal size   Lab Review Urine pregnancy test Labs reviewed yes Radiologic studies reviewed yes  50% of 20 min visit spent on counseling and coordination of care.   Assessment:     1. Encounter for routine gynecological examination with Papanicolaou smear of cervix Rx: - Cytology - PAP - Cervicovaginal ancillary only  2. Encounter for other  general counseling and advice on contraception - wants to conceive  3. Fibroids, intramural - stable  4. HTN (hypertension), benign - stable   Plan:    Education reviewed: calcium supplements, depression evaluation, low fat, low cholesterol diet, safe sex/STD prevention, self breast exams and weight bearing exercise. Contraception: none. Follow up in: 1 year.    Shelly Bombard MD

## 2017-04-02 NOTE — Progress Notes (Signed)
Presented for AEX. Wants PAP/GC. Had Mammogram 03/19/17.

## 2017-04-03 LAB — CERVICOVAGINAL ANCILLARY ONLY
CHLAMYDIA, DNA PROBE: NEGATIVE
Neisseria Gonorrhea: NEGATIVE

## 2017-04-04 LAB — CYTOLOGY - PAP
Diagnosis: NEGATIVE
HPV: NOT DETECTED

## 2017-05-03 ENCOUNTER — Encounter: Payer: Self-pay | Admitting: Allergy

## 2017-05-03 ENCOUNTER — Ambulatory Visit (INDEPENDENT_AMBULATORY_CARE_PROVIDER_SITE_OTHER): Payer: PRIVATE HEALTH INSURANCE | Admitting: Allergy

## 2017-05-03 VITALS — BP 130/86 | HR 95 | Ht 62.0 in | Wt 199.0 lb

## 2017-05-03 DIAGNOSIS — J4540 Moderate persistent asthma, uncomplicated: Secondary | ICD-10-CM

## 2017-05-03 DIAGNOSIS — H1010 Acute atopic conjunctivitis, unspecified eye: Secondary | ICD-10-CM

## 2017-05-03 DIAGNOSIS — J3089 Other allergic rhinitis: Secondary | ICD-10-CM | POA: Diagnosis not present

## 2017-05-03 DIAGNOSIS — T7800XD Anaphylactic reaction due to unspecified food, subsequent encounter: Secondary | ICD-10-CM

## 2017-05-03 DIAGNOSIS — J302 Other seasonal allergic rhinitis: Secondary | ICD-10-CM | POA: Diagnosis not present

## 2017-05-03 NOTE — Patient Instructions (Addendum)
1. Moderate persistent asthma without complication - Continue taking Symbicort 80- 2 puffs twice a day - Continue albuterol inhaler every 4 hours as needed for wheeze or shortness of breath - Continue montelukast 10 mg daily Asthma control goals:   Full participation in all desired activities (may need albuterol before activity)  Albuterol use two time or less a week on average (not counting use with activity)  Cough interfering with sleep two time or less a month  Oral steroids no more than once a year  No hospitalizations   2. Allergic rhinoconjunctivitis Your skin testing was positive to tree, weed, and grass pollens, molds, cat, dog, dust mite, and cockroach on your last visit. - Continue avoidance measures  - Continue Zyrtec 10 mg daily as needed for nasal symptoms - Flonase as needed for stuffy nose - Continue allergy eye drops as needed If your symptoms are not controlled with these medications, we can further discuss allergy shots -  3. Anaphylactic shock due to food, subsequent encounter - Continue to avoid crab, shrimp, lobster, orange, milk, beef, and, pineapple - Carry EpiPen at all times   Follow up in 6 months or sooner as needed

## 2017-05-03 NOTE — Progress Notes (Signed)
[address] [place] [address] Dept: [phone]  FOLLOW UP NOTE  Patient ID: Victoria George, female    DOB: 12-11-1975  Age: 42 y.o. MRN: [id number] Date of Office Visit: 05/03/2017  Assessment  Chief Complaint: Follow-up (Pt presents for follow up of rashes, and asthma. Pt states she has been avoiding foods which have helped with rashes, Pt states asthma has been controlled.)  HPI Victoria George is a 42 year old female who presents to the clinic for a follow-up visit.  She was last seen in this clinic on 02/28/2017 by Dr. Nelva Bush for evaluation of asthma, food allergy, hives and lip swelling.  At that time, she was noted to have food allergies to crab, shrimp, lobster, pineapple, orange, milk, and beef.  She was provided with an action plan and an EpiPen.  She was also noted to be positive to grass pollen, tree pollen, seasonal and perennial molds, dust mite, and cockroach for which she started taking Zyrtec 10 mg once a day and Flonase as needed.  Her asthma was reported is not well controlled and she was started on Symbicort 80-2 puffs twice a day and montelukast.  At today's visit, she reports she is doing well overall.Victoria George's asthma has been well controlled. She has not required rescue medication, experienced nocturnal awakenings due to lower respiratory symptoms, nor have activities of daily living been limited. She has required no Emergency Department or Urgent Care visits for her asthma. She has required zero courses of systemic steroids for asthma exacerbations since the last visit. ACT score today is 21, indicating excellent asthma symptom control.   Allergic rhinitis is reported as well controlled with daily cetirizine 10 mg.  Conjunctivitis is reported as well controlled with no medical intervention.  Lamaria has been avoiding crab, shrimp, lobster, pineapple, orange, milk, and beef and has not had any accidental ingestions.  She reports she has not had any hives or  lip swelling since her last visit.  She has not needed to use her epinephrine pen.  Drug Allergies:  Allergies  Allergen Reactions  . Phenergan [Promethazine] Shortness Of Breath and Palpitations  . Shellfish Allergy Anaphylaxis    ALL SHELLFISH  . Codeine Hives  . Oxycodone Other (See Comments)    Percocet-- "insomnia"  . Tramadol Other (See Comments)    Ultram-- " manic episode"  . Hydrocodone Hives and Itching    vicodin    Physical Exam: BP 130/86 (BP Location: Left Arm, Patient Position: Sitting, Cuff Size: Normal)   Pulse 95   Ht 5' 2"  (1.575 m)   Wt 199 lb (90.3 kg)   SpO2 99%   BMI 36.40 kg/m    Physical Exam  Constitutional: She is oriented to person, place, and time. She appears well-developed and well-nourished.  HENT:  Right Ear: External ear normal.  Left Ear: External ear normal.  Nose: Nose normal.  Mouth/Throat: Oropharynx is clear and moist.  Eyes: Conjunctivae are normal.  Neck: Normal range of motion. Neck supple.  Cardiovascular: Normal rate, regular rhythm and normal heart sounds.  S1-S2 normal.  Regular heart rate and rhythm.  No murmur noted.  Pulmonary/Chest: Effort normal and breath sounds normal.  Lungs clear to auscultation  Musculoskeletal: Normal range of motion.  Neurological: She is alert and oriented to person, place, and time.  Skin: Skin is warm and dry.  Psychiatric: She has a normal mood and affect. Her behavior is normal.    Diagnostics: FVC 1.97, FEV1 1.56.  Predicted FVC  3.03, predicted FEV1 2.65.  Spirometry indicates moderate restriction.    Assessment and Plan: 1. Moderate persistent asthma without complication   2. Anaphylactic shock due to food, subsequent encounter   3. Seasonal and perennial allergic rhinitis   4. Seasonal allergic conjunctivitis      Patient Instructions  1. Moderate persistent asthma without complication - Continue taking Symbicort 80- 2 puffs twice a day - Continue albuterol inhaler every 4  hours as needed for wheeze or shortness of breath - Continue montelukast 10 mg daily Asthma control goals:   Full participation in all desired activities (may need albuterol before activity)  Albuterol use two time or less a week on average (not counting use with activity)  Cough interfering with sleep two time or less a month  Oral steroids no more than once a year  No hospitalizations   2. Allergic rhinoconjunctivitis Your skin testing was positive to tree, weed, and grass pollens, molds, cat, dog, dust mite, and cockroach on your last visit. - Continue avoidance measures  - Continue Zyrtec 10 mg daily as needed for nasal symptoms - Flonase as needed for stuffy nose - Continue allergy eye drops as needed If your symptoms are not controlled with these medications, we can further discuss allergy shots -  3. Anaphylactic shock due to food, subsequent encounter - Continue to avoid crab, shrimp, lobster, orange, milk, beef, and, pineapple - Carry EpiPen at all times   Follow up in 6 months or sooner as needed    Return in about 6 months (around 10/31/2017), or if symptoms worsen or fail to improve.    Thank you for the opportunity to care for this patient.  Please do not hesitate to contact me with questions.  Gareth Morgan, FNP [facility]

## 2017-09-13 ENCOUNTER — Encounter (HOSPITAL_BASED_OUTPATIENT_CLINIC_OR_DEPARTMENT_OTHER): Payer: Self-pay | Admitting: *Deleted

## 2017-09-13 ENCOUNTER — Other Ambulatory Visit: Payer: Self-pay

## 2017-09-13 NOTE — Progress Notes (Signed)
Spoke w/ pt via phone for pre-op interview.  Npo after mn w/ exception clear liquids until 0830 (no cream/ milk products).  Arrive at 1230.  Needs hg and urine preg..  Will take sprintec and do symbicort inhaler am dos w/ sips of water.  Will bring rescue inhaler.

## 2017-09-14 ENCOUNTER — Other Ambulatory Visit: Payer: Self-pay | Admitting: Obstetrics and Gynecology

## 2017-09-17 ENCOUNTER — Other Ambulatory Visit: Payer: Self-pay | Admitting: Obstetrics and Gynecology

## 2017-09-17 ENCOUNTER — Ambulatory Visit: Payer: Self-pay | Admitting: Obstetrics and Gynecology

## 2017-09-19 ENCOUNTER — Ambulatory Visit (HOSPITAL_BASED_OUTPATIENT_CLINIC_OR_DEPARTMENT_OTHER)
Admission: RE | Admit: 2017-09-19 | Discharge: 2017-09-19 | Disposition: A | Discharge status: Home / Self Care | Payer: PRIVATE HEALTH INSURANCE | Source: Ambulatory Visit | Attending: Obstetrics and Gynecology | Admitting: Obstetrics and Gynecology

## 2017-09-19 ENCOUNTER — Encounter (HOSPITAL_BASED_OUTPATIENT_CLINIC_OR_DEPARTMENT_OTHER): Payer: Self-pay | Admitting: Emergency Medicine

## 2017-09-19 ENCOUNTER — Ambulatory Visit (HOSPITAL_BASED_OUTPATIENT_CLINIC_OR_DEPARTMENT_OTHER): Payer: PRIVATE HEALTH INSURANCE | Admitting: Certified Registered Nurse Anesthetist

## 2017-09-19 ENCOUNTER — Encounter (HOSPITAL_BASED_OUTPATIENT_CLINIC_OR_DEPARTMENT_OTHER)
Admission: RE | Disposition: A | Discharge status: Home / Self Care | Payer: Self-pay | Source: Ambulatory Visit | Attending: Obstetrics and Gynecology

## 2017-09-19 DIAGNOSIS — Z7951 Long term (current) use of inhaled steroids: Secondary | ICD-10-CM | POA: Diagnosis not present

## 2017-09-19 DIAGNOSIS — Z79899 Other long term (current) drug therapy: Secondary | ICD-10-CM | POA: Insufficient documentation

## 2017-09-19 DIAGNOSIS — I10 Essential (primary) hypertension: Secondary | ICD-10-CM | POA: Diagnosis not present

## 2017-09-19 DIAGNOSIS — D250 Submucous leiomyoma of uterus: Secondary | ICD-10-CM | POA: Insufficient documentation

## 2017-09-19 DIAGNOSIS — J4540 Moderate persistent asthma, uncomplicated: Secondary | ICD-10-CM | POA: Insufficient documentation

## 2017-09-19 DIAGNOSIS — Z6837 Body mass index (BMI) 37.0-37.9, adult: Secondary | ICD-10-CM | POA: Diagnosis not present

## 2017-09-19 DIAGNOSIS — D509 Iron deficiency anemia, unspecified: Secondary | ICD-10-CM | POA: Diagnosis not present

## 2017-09-19 DIAGNOSIS — Z793 Long term (current) use of hormonal contraceptives: Secondary | ICD-10-CM | POA: Insufficient documentation

## 2017-09-19 HISTORY — DX: Other seasonal allergic rhinitis: J30.2

## 2017-09-19 HISTORY — DX: Other allergic rhinitis: J30.89

## 2017-09-19 HISTORY — DX: Acute atopic conjunctivitis, unspecified eye: H10.10

## 2017-09-19 HISTORY — DX: Moderate persistent asthma, uncomplicated: J45.40

## 2017-09-19 HISTORY — DX: Submucous leiomyoma of uterus: D25.0

## 2017-09-19 HISTORY — PX: HYSTEROSCOPY WITH RESECTOSCOPE: SHX5395

## 2017-09-19 LAB — ABO/RH: ABO/RH(D): O POS

## 2017-09-19 LAB — CBC
HCT: 36.5 % (ref 36.0–46.0)
Hemoglobin: 12 g/dL (ref 12.0–15.0)
MCH: 30.6 pg (ref 26.0–34.0)
MCHC: 32.9 g/dL (ref 30.0–36.0)
MCV: 93.1 fL (ref 78.0–100.0)
PLATELETS: 301 10*3/uL (ref 150–400)
RBC: 3.92 MIL/uL (ref 3.87–5.11)
RDW: 12.9 % (ref 11.5–15.5)
WBC: 3.5 10*3/uL — ABNORMAL LOW (ref 4.0–10.5)

## 2017-09-19 LAB — TYPE AND SCREEN
ABO/RH(D): O POS
ANTIBODY SCREEN: NEGATIVE

## 2017-09-19 LAB — POCT PREGNANCY, URINE: Preg Test, Ur: NEGATIVE

## 2017-09-19 SURGERY — HYSTEROSCOPY, USING RESECTOSCOPE
Anesthesia: General

## 2017-09-19 MED ORDER — LIDOCAINE 2% (20 MG/ML) 5 ML SYRINGE
INTRAMUSCULAR | Status: AC
  Filled 2017-09-19: qty 5

## 2017-09-19 MED ORDER — KETOROLAC TROMETHAMINE 30 MG/ML IJ SOLN
INTRAMUSCULAR | Status: AC
  Filled 2017-09-19: qty 1

## 2017-09-19 MED ORDER — MEPERIDINE HCL 25 MG/ML IJ SOLN
6.2500 mg | INTRAMUSCULAR | Status: DC | PRN
  Filled 2017-09-19: qty 1

## 2017-09-19 MED ORDER — SODIUM CHLORIDE 0.9 % IR SOLN
Status: DC | PRN
  Administered 2017-09-19: 3000 mL

## 2017-09-19 MED ORDER — HYDROMORPHONE HCL 1 MG/ML IJ SOLN
INTRAMUSCULAR | Status: AC
  Filled 2017-09-19: qty 1

## 2017-09-19 MED ORDER — ONDANSETRON HCL 4 MG/2ML IJ SOLN
INTRAMUSCULAR | Status: DC | PRN
  Administered 2017-09-19: 4 mg via INTRAVENOUS

## 2017-09-19 MED ORDER — CLINDAMYCIN PHOSPHATE 900 MG/50ML IV SOLN
900.0000 mg | INTRAVENOUS | Status: AC
  Administered 2017-09-19: 900 mg via INTRAVENOUS
  Filled 2017-09-19: qty 50

## 2017-09-19 MED ORDER — LIDOCAINE 2% (20 MG/ML) 5 ML SYRINGE
INTRAMUSCULAR | Status: DC | PRN
  Administered 2017-09-19: 60 mg via INTRAVENOUS

## 2017-09-19 MED ORDER — FENTANYL CITRATE (PF) 100 MCG/2ML IJ SOLN
INTRAMUSCULAR | Status: DC | PRN
  Administered 2017-09-19 (×2): 50 ug via INTRAVENOUS

## 2017-09-19 MED ORDER — KETOROLAC TROMETHAMINE 30 MG/ML IJ SOLN
30.0000 mg | Freq: Once | INTRAMUSCULAR | Status: AC
  Administered 2017-09-19: 30 mg via INTRAMUSCULAR
  Filled 2017-09-19: qty 1

## 2017-09-19 MED ORDER — DEXAMETHASONE SODIUM PHOSPHATE 10 MG/ML IJ SOLN
INTRAMUSCULAR | Status: DC | PRN
  Administered 2017-09-19: 10 mg via INTRAVENOUS

## 2017-09-19 MED ORDER — CIPROFLOXACIN IN D5W 400 MG/200ML IV SOLN
INTRAVENOUS | Status: AC
  Filled 2017-09-19: qty 200

## 2017-09-19 MED ORDER — SCOPOLAMINE 1 MG/3DAYS TD PT72
1.0000 | MEDICATED_PATCH | Freq: Once | TRANSDERMAL | Status: AC
  Administered 2017-09-19: 1 via TRANSDERMAL
  Filled 2017-09-19: qty 1

## 2017-09-19 MED ORDER — CIPROFLOXACIN IN D5W 400 MG/200ML IV SOLN
400.0000 mg | INTRAVENOUS | Status: AC
  Administered 2017-09-19: 400 mg via INTRAVENOUS
  Filled 2017-09-19: qty 200

## 2017-09-19 MED ORDER — HYDROMORPHONE HCL 1 MG/ML IJ SOLN
0.2500 mg | INTRAMUSCULAR | Status: DC | PRN
  Administered 2017-09-19: 0.25 mg via INTRAVENOUS
  Administered 2017-09-19: 0.5 mg via INTRAVENOUS
  Administered 2017-09-19: 0.25 mg via INTRAVENOUS
  Filled 2017-09-19: qty 0.5

## 2017-09-19 MED ORDER — MIDAZOLAM HCL 2 MG/2ML IJ SOLN
INTRAMUSCULAR | Status: DC | PRN
  Administered 2017-09-19: 2 mg via INTRAVENOUS

## 2017-09-19 MED ORDER — PROPOFOL 10 MG/ML IV BOLUS
INTRAVENOUS | Status: AC
  Filled 2017-09-19: qty 20

## 2017-09-19 MED ORDER — MIDAZOLAM HCL 2 MG/2ML IJ SOLN
INTRAMUSCULAR | Status: AC
  Filled 2017-09-19: qty 2

## 2017-09-19 MED ORDER — VASOPRESSIN 20 UNIT/ML IV SOLN
INTRAVENOUS | Status: DC | PRN
  Administered 2017-09-19: 8 mL via INTRAMUSCULAR

## 2017-09-19 MED ORDER — LACTATED RINGERS IV SOLN
INTRAVENOUS | Status: DC
  Administered 2017-09-19 (×2): via INTRAVENOUS
  Filled 2017-09-19: qty 1000

## 2017-09-19 MED ORDER — PROMETHAZINE HCL 25 MG/ML IJ SOLN
6.2500 mg | INTRAMUSCULAR | Status: DC | PRN
  Filled 2017-09-19: qty 1

## 2017-09-19 MED ORDER — DEXAMETHASONE SODIUM PHOSPHATE 10 MG/ML IJ SOLN
INTRAMUSCULAR | Status: AC
  Filled 2017-09-19: qty 1

## 2017-09-19 MED ORDER — CLINDAMYCIN PHOSPHATE 600 MG/50ML IV SOLN
INTRAVENOUS | Status: AC
  Filled 2017-09-19: qty 50

## 2017-09-19 MED ORDER — SCOPOLAMINE 1 MG/3DAYS TD PT72
MEDICATED_PATCH | TRANSDERMAL | Status: AC
  Filled 2017-09-19: qty 1

## 2017-09-19 MED ORDER — PROPOFOL 10 MG/ML IV BOLUS
INTRAVENOUS | Status: DC | PRN
  Administered 2017-09-19: 30 mg via INTRAVENOUS
  Administered 2017-09-19: 170 mg via INTRAVENOUS

## 2017-09-19 MED ORDER — HYDROMORPHONE HCL 2 MG PO TABS
2.0000 mg | ORAL_TABLET | Freq: Once | ORAL | Status: AC
  Administered 2017-09-19: 2 mg via ORAL
  Filled 2017-09-19: qty 1

## 2017-09-19 MED ORDER — FENTANYL CITRATE (PF) 100 MCG/2ML IJ SOLN
INTRAMUSCULAR | Status: AC
  Filled 2017-09-19: qty 2

## 2017-09-19 MED ORDER — HYDROMORPHONE HCL 2 MG PO TABS
ORAL_TABLET | ORAL | Status: AC
  Filled 2017-09-19: qty 1

## 2017-09-19 MED ORDER — MIDAZOLAM HCL 2 MG/2ML IJ SOLN
0.5000 mg | Freq: Once | INTRAMUSCULAR | Status: DC | PRN
  Filled 2017-09-19: qty 2

## 2017-09-19 MED ORDER — CLINDAMYCIN PHOSPHATE 900 MG/50ML IV SOLN
INTRAVENOUS | Status: AC
  Filled 2017-09-19: qty 50

## 2017-09-19 MED ORDER — ONDANSETRON HCL 4 MG/2ML IJ SOLN
INTRAMUSCULAR | Status: AC
  Filled 2017-09-19: qty 2

## 2017-09-19 SURGICAL SUPPLY — 30 items
BIPOLAR CUTTING LOOP 21FR (ELECTRODE) ×2
CANISTER SUCT 3000ML PPV (MISCELLANEOUS) ×3 IMPLANT
CANNULA CURETTE W/SYR 6 (CANNULA) IMPLANT
CANNULA CURETTE W/SYR 6MM (CANNULA)
CANNULA CURETTE W/SYR 7 (CANNULA) IMPLANT
CANNULA CURETTE W/SYR 7MM (CANNULA)
CATH NOVY CORNUAL CURVED 5.0 (CATHETERS) IMPLANT
CATH ROBINSON RED A/P 16FR (CATHETERS) ×3 IMPLANT
DRAPE C-ARM 42X120 X-RAY (DRAPES) IMPLANT
ELECT BIPOLAR KNIFE NDL PTD 7M (ELECTRODE) IMPLANT
ELECT REM PT RETURN 9FT ADLT (ELECTROSURGICAL) ×3
ELECTRODE REM PT RTRN 9FT ADLT (ELECTROSURGICAL) ×1 IMPLANT
GLOVE BIO SURGEON STRL SZ8 (GLOVE) ×3 IMPLANT
GLOVE BIOGEL PI IND STRL 7.0 (GLOVE) ×1 IMPLANT
GLOVE BIOGEL PI IND STRL 8.5 (GLOVE) ×1 IMPLANT
GLOVE BIOGEL PI INDICATOR 7.0 (GLOVE) ×2
GLOVE BIOGEL PI INDICATOR 8.5 (GLOVE) ×2
GOWN STRL REUS W/TWL LRG LVL3 (GOWN DISPOSABLE) ×6 IMPLANT
LOOP CUTTING BIPOLAR 21FR (ELECTRODE) ×1 IMPLANT
PACK VAGINAL MINOR WOMEN LF (CUSTOM PROCEDURE TRAY) ×3 IMPLANT
PAD OB MATERNITY 4.3X12.25 (PERSONAL CARE ITEMS) ×3 IMPLANT
SEPRAFILM MEMBRANE 5X6 (MISCELLANEOUS) IMPLANT
SET IRRIG Y TYPE TUR BLADDER L (SET/KITS/TRAYS/PACK) ×3 IMPLANT
STENT BALLN UTERINE 3CM 6FR (STENTS) IMPLANT
STENT BALLN UTERINE 4CM 6FR (STENTS) IMPLANT
TOWEL OR 17X24 6PK STRL BLUE (TOWEL DISPOSABLE) ×6 IMPLANT
TUBE CONNECTING 12'X1/4 (SUCTIONS) ×2
TUBE CONNECTING 12X1/4 (SUCTIONS) ×4 IMPLANT
TUBING AQUILEX INFLOW (TUBING) IMPLANT
TUBING AQUILEX OUTFLOW (TUBING) IMPLANT

## 2017-09-19 NOTE — Progress Notes (Signed)
Spoke with Dr. Kerin Perna about patient's p[ain control.  She stated that Tylenol and Aleve will not be enough to control her pain, and would like dilaudid.  Order obtained for Toradol IM and for patient to call the office, if needed, in the morning and ask if there is anyone who can prescribe medication at that time. Dr. Kerin Perna feels that due to the extent of her surgery, she will probably not need more than that.  I offered toradol IM per order, and patient was willing to accept that.  She also felt okay with the instructions and explanation given per Dr. Charlett Lango instructions.

## 2017-09-19 NOTE — Transfer of Care (Signed)
Immediate Anesthesia Transfer of Care Note  Patient: Victoria George  Procedure(s) Performed: HYSTEROSCOPY MYOMECTOMY (N/A )  Patient Location: PACU  Anesthesia Type:General  Level of Consciousness: awake, alert , oriented and patient cooperative  Airway & Oxygen Therapy: Patient Spontanous Breathing and Patient connected to nasal cannula oxygen  Post-op Assessment: Report given to RN and Post -op Vital signs reviewed and stable  Post vital signs: Reviewed and stable  Last Vitals:  Vitals Value Taken Time  BP    Temp    Pulse 92 09/19/2017  5:05 PM  Resp 9 09/19/2017  5:05 PM  SpO2 100 % 09/19/2017  5:05 PM  Vitals shown include unvalidated device data.  Last Pain:  Vitals:   09/19/17 1214  TempSrc: Oral         Complications: No apparent anesthesia complications

## 2017-09-19 NOTE — Anesthesia Procedure Notes (Signed)
Procedure Name: LMA Insertion Date/Time: 09/19/2017 4:16 PM Performed by: Raenette Rover, CRNA Pre-anesthesia Checklist: Patient identified, Emergency Drugs available, Suction available and Patient being monitored Patient Re-evaluated:Patient Re-evaluated prior to induction Oxygen Delivery Method: Circle system utilized Preoxygenation: Pre-oxygenation with 100% oxygen Induction Type: IV induction Ventilation: Mask ventilation without difficulty LMA: LMA inserted LMA Size: 4.0 Number of attempts: 1 Placement Confirmation: positive ETCO2,  CO2 detector and breath sounds checked- equal and bilateral Tube secured with: Tape Dental Injury: Teeth and Oropharynx as per pre-operative assessment

## 2017-09-19 NOTE — Op Note (Signed)
OPERATIVE NOTE  Preoperative diagnosis: Recurrent submucosal myoma  Postoperative diagnosis: Recurrent submucosal type I myoma  Procedure: Hysteroscopy, cervical dilation, resection of submucosal myoma  Surgeon: Governor Specking  Anesthesia: General  Complications: None  Estimated blood loss: Less than 20 mL  Specimen: Myoma pieces to pathology  Findings: Endocervical canal appeared normal. The uterus sounded to 8 cm. Endometrial cavity had had an anterior type I submucosal myoma measuring 1.4 x 1.4 cm by previous ultrasound.  It was adherent to the posterior wall with filmy adhesions. Otherwise the endometrial cavity was of normal appearance and normal configuration. Both tubal ostia were seen.  Description of procedure: Patient was placed in dorsal supine position. General anesthesia was administered. She was placed in lithotomy position. She was prepped and draped in sterile manner. A vaginal speculum was placed. A dilute vasopressin solution containing 0.33 units per milliliter was injected into the cervical stroma x5 cc. A Slimline hysteroscope with 30 lens was inserted into the canal and above findings were noted.  Distention medium was saline.  Distention method was gravity.  Above findings were noted.  We dilated the cervix to 59 [place] with Pratt dilators and inserted a bipolar resectoscope with a 90 degree 22 [place] loop.  Using the cutting current mode the myoma was resected gradually.  By applying a repeat section and distention the intramural portion of the myoma was also expressed and was resected.  The myoma pieces were scraped out and sent to pathology. Hemostasis was insured. Instrument count was correct. Estimated blood loss was less than 20 mL. The patient tolerated the procedure well and was transferred to recovery in satisfactory condition.  Governor Specking

## 2017-09-19 NOTE — Anesthesia Postprocedure Evaluation (Signed)
Anesthesia Post Note  Patient: Victoria George  Procedure(s) Performed: HYSTEROSCOPY MYOMECTOMY (N/A )     Patient location during evaluation: PACU Anesthesia Type: General Level of consciousness: awake and alert Pain management: pain level controlled Vital Signs Assessment: post-procedure vital signs reviewed and stable Respiratory status: spontaneous breathing, nonlabored ventilation and respiratory function stable Cardiovascular status: blood pressure returned to baseline and stable Postop Assessment: no apparent nausea or vomiting Anesthetic complications: no    Last Vitals:  Vitals:   09/19/17 1730 09/19/17 1800  BP: (!) 150/97 (!) 147/91  Pulse: 74 69  Resp: 11 16  Temp:    SpO2: 100% 100%    Last Pain:  Vitals:   09/19/17 1800  TempSrc:   PainSc: [place]

## 2017-09-19 NOTE — H&P (Signed)
Victoria George is a 42 y.o. 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 Baltazar Najjar, for myomectomy and infertility.  She was diagnosed with fibroids because of abnormal uterine bleeding and underwent abdominal myomectomy.  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  Patient would like to preserve her childbearing potential.  Pertinent Gynecological History: Menses: flow is excessive with use of 3 pads or tampons on heaviest days Bleeding: dysfunctional uterine bleeding Contraception: none DES exposure: denies Blood transfusions: none Sexually transmitted diseases: no past history Previous GYN Procedures: Laparoscopic myomectomy Last mammogram: normal Last pap: normal    Menstrual History: Menarche age: 65   Past Medical History:  Diagnosis Date  . History of gastritis    12/ 2012  . History of ovarian cyst   . Hypertension   . Iron deficiency anemia   . Moderate persistent asthma, uncomplicated    [facility]-- dr Nelva Bush  . Seasonal allergic conjunctivitis   . Seasonal and perennial allergic rhinitis   . Submucous leiomyoma of uterus   . Wears glasses                     Past Surgical History:  Procedure Laterality Date  . CARDIOVASCULAR STRESS TEST    . CHOLECYSTECTOMY  02/01/2011   Procedure: LAPAROSCOPIC CHOLECYSTECTOMY WITH INTRAOPERATIVE CHOLANGIOGRAM;  Surgeon: Odis Hollingshead, MD;  Location: [facility];  Service: General;  Laterality: N/A;  Needs IOC and C-arm  . DIAGNOSTIC LAPAROSCOPY  07/2007   ?Lysis Adhesions for obstructive right fallopian tube  . ESOPHAGOGASTRODUODENOSCOPY  01/30/2011   Procedure: ESOPHAGOGASTRODUODENOSCOPY (EGD);  Surgeon: Zenovia Jarred, MD;  Location: [facility];  Service: Gastroenterology;  Laterality: N/A;  . ESOPHAGOGASTRODUODENOSCOPY  03/03/2011   Procedure: ESOPHAGOGASTRODUODENOSCOPY (EGD);  Surgeon: Zenovia Jarred, MD;  Location: [facility];  Service: Gastroenterology;  Laterality: N/A;  .  HYSTEROSCOPY N/A 03/22/2016   Procedure: HYSTEROSCOPY, POLYPECTOMY;  Surgeon: Governor Specking, MD;  Location: [place];  Service: Gynecology;  Laterality: N/A;  . LAPAROSCOPIC GELPORT ASSISTED MYOMECTOMY N/A 03/22/2016   Procedure: LAPAROSCOPIC GELPORT ASSISTED MYOMECTOMY, LYSIS OF ADHESIONS, CHROMOPERTUBATION;  Surgeon: Governor Specking, MD;  Location: [place];  Service: Gynecology;  Laterality: N/A;  . TRANSTHORACIC ECHOCARDIOGRAM               Family History  Problem Relation Age of Onset  . Hypertension Mother   . Colon cancer Maternal Grandmother   . Diabetes Maternal Grandmother   . Kidney disease Maternal Grandmother   . Diabetes Brother   . Diabetes Paternal Grandmother   . Diverticulosis Paternal Grandmother   . Anesthesia problems Neg Hx   . Malignant hyperthermia Neg Hx   . Pseudochol deficiency Neg Hx    No hereditary disease.  No cancer of breast, ovary, uterus. No cutaneous leiomyomatosis or renal cell carcinoma.  Social History   Socioeconomic History  . Marital status: Married    Spouse name: Not on file  . Number of children: 0  . Years of education: Not on file  . Highest education level: Not on file  Occupational History  . Occupation: Nurse, adult: behavorial health  Social Needs  . Financial resource strain: Not on file  . Food insecurity:    Worry: Not on file    Inability: Not on file  . Transportation needs:    Medical: Not on file    Non-medical: Not on file  Tobacco Use  .  Smoking status: Never Smoker  . Smokeless tobacco: Never Used  Substance and Sexual Activity  . Alcohol use: Yes    Comment: occasionally  . Drug use: No  . Sexual activity: Yes    Partners: Male    Birth control/protection: Pill  Lifestyle  . Physical activity:    Days per week: Not on file    Minutes per session: Not on file  . Stress: Not on file  Relationships  . Social connections:    Talks on phone: Not on file     Gets together: Not on file    Attends religious service: Not on file    Active member of club or organization: Not on file    Attends meetings of clubs or organizations: Not on file    Relationship status: Not on file  . Intimate partner violence:    Fear of current or ex partner: Not on file    Emotionally abused: Not on file    Physically abused: Not on file    Forced sexual activity: Not on file  Other Topics Concern  . Not on file  Social History Narrative  . Not on file    Allergies  Allergen Reactions  . Phenergan [Promethazine] Shortness Of Breath and Palpitations  . Shellfish Allergy Anaphylaxis    ALL SHELLFISH  . Codeine Hives  . Milk-Related Compounds Itching and Swelling    All dairy products  . Other Hives and Swelling    Oranges,  pineapples  . Oxycodone Other (See Comments)    Percocet-- "insomnia"  . Tramadol Other (See Comments)    Ultram-- " manic episode"  . Hydrocodone Hives and Itching    vicodin    No current facility-administered medications on file prior to encounter.    Current Outpatient Medications on File Prior to Encounter  Medication Sig Dispense Refill  . albuterol (PROVENTIL HFA;VENTOLIN HFA) 108 (90 Base) MCG/ACT inhaler Inhale 2 puffs into the lungs every 6 (six) hours as needed for wheezing or shortness of breath. 18 g 3  . budesonide-formoterol (SYMBICORT) 80-4.5 MCG/ACT inhaler Inhale 2 puffs into the lungs every morning. (Patient taking differently: Inhale 2 puffs into the lungs every morning. ) 1 Inhaler 5  . Calcium 600-400 MG-UNIT CHEW Chew by mouth daily.    . Cholecalciferol (VITAMIN D3) 2000 units TABS Take 1 tablet by mouth daily.    . Coenzyme Q10 (COQ-10) 200 MG CAPS Take 1 capsule by mouth every morning.    Marland Kitchen EPINEPHrine 0.3 mg/0.3 mL IJ SOAJ injection Inject into the muscle once.    . ferrous sulfate 325 (65 FE) MG EC tablet Take 325 mg by mouth daily with breakfast.     . folic acid (FOLVITE) 1 MG tablet Take 1 tablet  (1 mg total) by mouth daily. (Patient taking differently: Take 1 mg by mouth every morning. ) 90 tablet 3  . hydrochlorothiazide (HYDRODIURIL) 25 MG tablet Take 25 mg by mouth every morning.     . montelukast (SINGULAIR) 10 MG tablet Take 1 tablet (10 mg total) by mouth at bedtime. (Patient taking differently: Take 10 mg by mouth at bedtime. ) 30 tablet 5  . Multiple Vitamin (MULTIVITAMIN WITH MINERALS) TABS tablet Take 1 tablet by mouth daily.    . norgestimate-ethinyl estradiol (ORTHO-CYCLEN,SPRINTEC,PREVIFEM) 0.25-35 MG-MCG tablet Take 1 tablet by mouth every morning.    . naproxen sodium (ANAPROX) 220 MG tablet Take 220 mg by mouth 2 (two) times daily as needed (pain).     . [  DISCONTINUED] pantoprazole (PROTONIX) 40 MG tablet Take 40 mg by mouth every morning.    . [DISCONTINUED] promethazine (PHENERGAN) 25 MG tablet Take 1 tablet (25 mg total) by mouth every 6 (six) hours as needed for nausea. 30 tablet 0     Review of Systems  Constitutional: Negative.   HENT: Negative.   Eyes: Negative.   Respiratory: Negative.   Cardiovascular: Negative.   Gastrointestinal: Negative.   Genitourinary: Negative.   Musculoskeletal: Negative.   Skin: Negative.   Neurological: Negative.   Endo/Heme/Allergies: Negative.   Psychiatric/Behavioral: Negative.     Physical Exam  BP (!) 136/96 (BP Location: Left Arm, Patient Position: Sitting)   Pulse 77   Temp 98.4 F (36.9 C) (Oral)   Resp 18   Ht 5' 2"  (1.575 m)   Wt 92.3 kg (203 lb 8 oz)   LMP 09/12/2017 (Exact Date)   SpO2 99%   BMI 37.22 kg/m  Constitutional: She is oriented to person, place, and time. She appears well-developed and well-nourished.  HENT:  Head: Normocephalic and atraumatic.  Nose: Nose normal.  Mouth/Throat: Oropharynx is clear and moist. No oropharyngeal exudate.  Eyes: Conjunctivae normal and EOM are normal. Pupils are equal, round, and reactive to light. No scleral icterus.  Neck: Normal range of motion. Neck supple.  No tracheal deviation present. No thyromegaly present.  Cardiovascular: Normal rate.   Respiratory: Effort normal and breath sounds normal.  GI: Soft. Bowel sounds are normal. She exhibits no distension and no mass. There is no tenderness.  Lymphadenopathy:    She has no cervical adenopathy.  Neurological: She is alert and oriented to person, place, and time. She has normal reflexes.  Skin: Skin is warm.  Psychiatric: She has a normal mood and affect. Her behavior is normal. Judgment and thought content normal.    Assessment/Plan:  Submucosal type I recurrent myoma Status post abdominal myomectomy Desire to conceive Preoperative for hysteroscopic resection of submucosal myoma Benefits and risks of hysteroscopic resectoscopic myomectomy were discussed with the patient and her family member again. All of patient's questions were answered.  She verbalized understanding.   Governor Specking, MD

## 2017-09-19 NOTE — Anesthesia Preprocedure Evaluation (Signed)
Anesthesia Evaluation  Patient identified by MRN, date of birth, ID band Patient awake    Reviewed: Allergy & Precautions, NPO status , Patient's Chart, lab work & pertinent test results  History of Anesthesia Complications Negative for: history of anesthetic complications  Airway Mallampati: I  TM Distance: >3 FB Neck ROM: Full    Dental  (+) Missing, Dental Advisory Given   Pulmonary asthma (last inhaler use 3d ago) ,    breath sounds clear to auscultation       Cardiovascular hypertension, Pt. on medications (-) angina Rhythm:Regular Rate:Normal     Neuro/Psych negative neurological ROS     GI/Hepatic negative GI ROS, Neg liver ROS,   Endo/Other  Morbid obesity  Renal/GU negative Renal ROS     Musculoskeletal   Abdominal (+) + obese,   Peds  Hematology negative hematology ROS (+)   Anesthesia Other Findings   Reproductive/Obstetrics LMP 09/12/17                             Anesthesia Physical Anesthesia Plan  ASA: II  Anesthesia Plan: General   Post-op Pain Management:    Induction: Intravenous  PONV Risk Score and Plan: 4 or greater and Ondansetron, Dexamethasone and Scopolamine patch - Pre-op  Airway Management Planned: Oral ETT  Additional Equipment:   Intra-op Plan:   Post-operative Plan: Extubation in OR  Informed Consent: I have reviewed the patients History and Physical, chart, labs and discussed the procedure including the risks, benefits and alternatives for the proposed anesthesia with the patient or authorized representative who has indicated his/her understanding and acceptance.   Dental advisory given  Plan Discussed with: CRNA and Surgeon  Anesthesia Plan Comments: (Plan routine monitors, GETA)        Anesthesia Quick Evaluation

## 2017-09-19 NOTE — Discharge Instructions (Signed)
°  DISCHARGE INSTRUCTIONS: HYSTEROSCOPY / ENDOMETRIAL ABLATION The following instructions have been prepared to help you care for yourself upon your return home.    Personal hygiene:  Use sanitary pads for vaginal drainage, not tampons.  Shower the day after your procedure.  NO tub baths, pools or Jacuzzis for 2-3 weeks.  Wipe front to back after using the bathroom.  Activity and limitations:  Do NOT drive or operate any equipment for 24 hours. The effects of anesthesia are still present and drowsiness may result.  Do NOT rest in bed all day.  Walking is encouraged.  Walk up and down stairs slowly.  You may resume your normal activity in one to two days or as indicated by your physician. Sexual activity: NO intercourse for at least 2 weeks after the procedure, or as indicated by your Doctor.  Diet: Eat a light meal as desired this evening. You may resume your usual diet tomorrow.  Return to Work: You may resume your work activities in one to two days or as indicated by Marine scientist.  What to expect after your surgery: Expect to have vaginal bleeding/discharge for 2-3 days and spotting for up to 10 days. It is not unusual to have soreness for up to 1-2 weeks. You may have a slight burning sensation when you urinate for the first day. Mild cramps may continue for a couple of days. You may have a regular period in 2-6 weeks.  Call your doctor for any of the following:  Excessive vaginal bleeding or clotting, saturating and changing one pad every hour.  Inability to urinate 6 hours after discharge from hospital.  Pain not relieved by pain medication.  Fever of 100.4 F or greater.  Unusual vaginal discharge or odor.  Return to office _________________Call for an appointment ___________________ Patients signature: ______________________ Nurses signature ________________________  Post Anesthesia Care Unit [phone]    Post Anesthesia Home Care  Instructions  Activity: Get plenty of rest for the remainder of the day. A responsible individual must stay with you for 24 hours following the procedure.  For the next 24 hours, DO NOT: -Drive a car -Paediatric nurse -Drink alcoholic beverages -Take any medication unless instructed by your physician -Make any legal decisions or sign important papers.  Meals: Start with liquid foods such as gelatin or soup. Progress to regular foods as tolerated. Avoid greasy, spicy, heavy foods. If nausea and/or vomiting occur, drink only clear liquids until the nausea and/or vomiting subsides. Call your physician if vomiting continues.  Special Instructions/Symptoms: Your throat may feel dry or sore from the anesthesia or the breathing tube placed in your throat during surgery. If this causes discomfort, gargle with warm salt water. The discomfort should disappear within 24 hours.  If you had a scopolamine patch placed behind your ear for the management of post- operative nausea and/or vomiting:  1. The medication in the patch is effective for 72 hours, after which it should be removed.  Wrap patch in a tissue and discard in the trash. Wash hands thoroughly with soap and water. 2. You may remove the patch earlier than 72 hours if you experience unpleasant side effects which may include dry mouth, dizziness or visual disturbances. 3. Avoid touching the patch. Wash your hands with soap and water after contact with the patch.

## 2017-09-20 ENCOUNTER — Encounter (HOSPITAL_BASED_OUTPATIENT_CLINIC_OR_DEPARTMENT_OTHER): Payer: Self-pay | Admitting: Obstetrics and Gynecology

## 2017-11-01 ENCOUNTER — Ambulatory Visit: Payer: PRIVATE HEALTH INSURANCE | Admitting: Allergy

## 2018-03-25 ENCOUNTER — Ambulatory Visit (INDEPENDENT_AMBULATORY_CARE_PROVIDER_SITE_OTHER): Payer: PRIVATE HEALTH INSURANCE | Admitting: Obstetrics

## 2018-03-25 ENCOUNTER — Encounter: Payer: Self-pay | Admitting: Obstetrics

## 2018-03-25 VITALS — BP 130/91 | HR 96 | Wt 228.0 lb

## 2018-03-25 DIAGNOSIS — N839 Noninflammatory disorder of ovary, fallopian tube and broad ligament, unspecified: Secondary | ICD-10-CM

## 2018-03-25 NOTE — Progress Notes (Addendum)
Patient ID: Victoria George, female   DOB: [birth year], 42 y.o.   MRN: [id number]  Chief Complaint  Patient presents with  . Gynecologic Exam    HPI Victoria George is a 42 y.o. female.  History of ovulation disorder.  Referred to Dr.  Kerin Perna.  Not pregnant yet.  Has undergone embryo transfers and is awaiting results. HPI  Past Medical History:  Diagnosis Date  . History of gastritis    12/ 2012  . History of ovarian cyst   . Hypertension   . Iron deficiency anemia   . Moderate persistent asthma, uncomplicated    [facility]-- dr Nelva Bush  . Seasonal allergic conjunctivitis   . Seasonal and perennial allergic rhinitis   . Submucous leiomyoma of uterus   . Wears glasses     Past Surgical History:  Procedure Laterality Date  . CARDIOVASCULAR STRESS TEST    . CHOLECYSTECTOMY  02/01/2011   Procedure: LAPAROSCOPIC CHOLECYSTECTOMY WITH INTRAOPERATIVE CHOLANGIOGRAM;  Surgeon: Odis Hollingshead, MD;  Location: [facility];  Service: General;  Laterality: N/A;  Needs IOC and C-arm  . DIAGNOSTIC LAPAROSCOPY  07/2007   ?Lysis Adhesions for obstructive right fallopian tube  . ESOPHAGOGASTRODUODENOSCOPY  01/30/2011   Procedure: ESOPHAGOGASTRODUODENOSCOPY (EGD);  Surgeon: Zenovia Jarred, MD;  Location: [facility];  Service: Gastroenterology;  Laterality: N/A;  . ESOPHAGOGASTRODUODENOSCOPY  03/03/2011   Procedure: ESOPHAGOGASTRODUODENOSCOPY (EGD);  Surgeon: Zenovia Jarred, MD;  Location: [facility];  Service: Gastroenterology;  Laterality: N/A;  . HYSTEROSCOPY N/A 03/22/2016   Procedure: HYSTEROSCOPY, POLYPECTOMY;  Surgeon: Governor Specking, MD;  Location: [place];  Service: Gynecology;  Laterality: N/A;  . HYSTEROSCOPY WITH RESECTOSCOPE N/A 09/19/2017   Procedure: HYSTEROSCOPY MYOMECTOMY;  Surgeon: Governor Specking, MD;  Location: [facility];  Service: Gynecology;  Laterality: N/A;  . LAPAROSCOPIC GELPORT ASSISTED MYOMECTOMY N/A 03/22/2016   Procedure:  LAPAROSCOPIC GELPORT ASSISTED MYOMECTOMY, LYSIS OF ADHESIONS, CHROMOPERTUBATION;  Surgeon: Governor Specking, MD;  Location: [place];  Service: Gynecology;  Laterality: N/A;  . TRANSTHORACIC ECHOCARDIOGRAM      Family History  Problem Relation Age of Onset  . Hypertension Mother   . Colon cancer Maternal Grandmother   . Diabetes Maternal Grandmother   . Kidney disease Maternal Grandmother   . Diabetes Brother   . Diabetes Paternal Grandmother   . Diverticulosis Paternal Grandmother   . Anesthesia problems Neg Hx   . Malignant hyperthermia Neg Hx   . Pseudochol deficiency Neg Hx     Social History Social History   Tobacco Use  . Smoking status: Never Smoker  . Smokeless tobacco: Never Used  Substance Use Topics  . Alcohol use: Yes    Comment: occasionally  . Drug use: No    Allergies  Allergen Reactions  . Phenergan [Promethazine] Shortness Of Breath and Palpitations  . Shellfish Allergy Anaphylaxis    ALL SHELLFISH  . Codeine Hives  . Milk-Related Compounds Itching and Swelling    All dairy products  . Other Hives and Swelling    Oranges,  pineapples  . Oxycodone Other (See Comments)    Percocet-- "insomnia"  . Tramadol Other (See Comments)    Ultram-- " manic episode"  . Hydrocodone Hives and Itching    vicodin    Current Outpatient Medications  Medication Sig Dispense Refill  . albuterol (PROVENTIL HFA;VENTOLIN HFA) 108 (90 Base) MCG/ACT inhaler Inhale 2 puffs into the lungs every 6 (six) hours as needed for wheezing or shortness of breath. [place]  g 3  . budesonide-formoterol (SYMBICORT) 80-4.5 MCG/ACT inhaler Inhale 2 puffs into the lungs every morning. (Patient taking differently: Inhale 2 puffs into the lungs every morning. ) 1 Inhaler 5  . Calcium 600-400 MG-UNIT CHEW Chew by mouth daily.    . Cholecalciferol (VITAMIN D3) 2000 units TABS Take 1 tablet by mouth daily.    . Coenzyme Q10 (COQ-10) 200 MG CAPS Take 1 capsule by mouth every morning.     . folic acid (FOLVITE) 1 MG tablet Take 1 tablet (1 mg total) by mouth daily. (Patient taking differently: Take 1 mg by mouth every morning. ) 90 tablet 3  . hydrochlorothiazide (HYDRODIURIL) 25 MG tablet Take 25 mg by mouth every morning.     . Multiple Vitamin (MULTIVITAMIN WITH MINERALS) TABS tablet Take 1 tablet by mouth daily.    Marland Kitchen EPINEPHrine 0.3 mg/0.3 mL IJ SOAJ injection Inject into the muscle once.    . ferrous sulfate 325 (65 FE) MG EC tablet Take 325 mg by mouth daily with breakfast.     . montelukast (SINGULAIR) 10 MG tablet Take 1 tablet (10 mg total) by mouth at bedtime. (Patient taking differently: Take 10 mg by mouth at bedtime. ) 30 tablet 5  . naproxen sodium (ANAPROX) 220 MG tablet Take 220 mg by mouth 2 (two) times daily as needed (pain).     . norgestimate-ethinyl estradiol (ORTHO-CYCLEN,SPRINTEC,PREVIFEM) 0.25-35 MG-MCG tablet Take 1 tablet by mouth every morning.     No current facility-administered medications for this visit.     Review of Systems Review of Systems Constitutional: negative for fatigue and weight loss Respiratory: negative for cough and wheezing Cardiovascular: negative for chest pain, fatigue and palpitations Gastrointestinal: negative for abdominal pain and change in bowel habits Genitourinary:negative Integument/breast: negative for nipple discharge Musculoskeletal:negative for myalgias Neurological: negative for gait problems and tremors Behavioral/Psych: negative for abusive relationship, depression Endocrine: negative for temperature intolerance      Blood pressure (!) 130/91, pulse 96, weight 228 lb (103.4 kg), last menstrual period 01/31/2018.  Physical Exam Physical Exam:  Deferred   >50% of 15 min visit spent on counseling and coordination of care.   Data Reviewed Labs  Assessment     1. Disorder of ovulation    Plan   Follow up as needed  No orders of the defined types were placed in this encounter.  No orders of  the defined types were placed in this encounter.     Shelly Bombard MD 03-25-2018

## 2018-10-03 ENCOUNTER — Encounter: Payer: PRIVATE HEALTH INSURANCE | Admitting: Obstetrics & Gynecology

## 2018-10-03 ENCOUNTER — Encounter: Payer: Self-pay | Admitting: Family Medicine

## 2018-10-03 ENCOUNTER — Ambulatory Visit (INDEPENDENT_AMBULATORY_CARE_PROVIDER_SITE_OTHER): Payer: PRIVATE HEALTH INSURANCE | Admitting: Family Medicine

## 2018-10-03 ENCOUNTER — Other Ambulatory Visit: Payer: Self-pay

## 2018-10-03 VITALS — BP 150/93 | HR 101 | Wt 242.6 lb

## 2018-10-03 DIAGNOSIS — D259 Leiomyoma of uterus, unspecified: Secondary | ICD-10-CM

## 2018-10-03 DIAGNOSIS — O0991 Supervision of high risk pregnancy, unspecified, first trimester: Secondary | ICD-10-CM

## 2018-10-03 DIAGNOSIS — Z113 Encounter for screening for infections with a predominantly sexual mode of transmission: Secondary | ICD-10-CM

## 2018-10-03 DIAGNOSIS — O219 Vomiting of pregnancy, unspecified: Secondary | ICD-10-CM

## 2018-10-03 DIAGNOSIS — O3411 Maternal care for benign tumor of corpus uteri, first trimester: Secondary | ICD-10-CM

## 2018-10-03 DIAGNOSIS — O10911 Unspecified pre-existing hypertension complicating pregnancy, first trimester: Secondary | ICD-10-CM

## 2018-10-03 DIAGNOSIS — Z3A11 11 weeks gestation of pregnancy: Secondary | ICD-10-CM

## 2018-10-03 DIAGNOSIS — N898 Other specified noninflammatory disorders of vagina: Secondary | ICD-10-CM | POA: Diagnosis not present

## 2018-10-03 DIAGNOSIS — O0990 Supervision of high risk pregnancy, unspecified, unspecified trimester: Secondary | ICD-10-CM

## 2018-10-03 DIAGNOSIS — O99211 Obesity complicating pregnancy, first trimester: Secondary | ICD-10-CM

## 2018-10-03 DIAGNOSIS — O99210 Obesity complicating pregnancy, unspecified trimester: Secondary | ICD-10-CM

## 2018-10-03 DIAGNOSIS — Z3490 Encounter for supervision of normal pregnancy, unspecified, unspecified trimester: Secondary | ICD-10-CM | POA: Insufficient documentation

## 2018-10-03 DIAGNOSIS — O10919 Unspecified pre-existing hypertension complicating pregnancy, unspecified trimester: Secondary | ICD-10-CM | POA: Insufficient documentation

## 2018-10-03 DIAGNOSIS — Z3183 Encounter for assisted reproductive fertility procedure cycle: Secondary | ICD-10-CM

## 2018-10-03 MED ORDER — ASPIRIN EC 81 MG PO TBEC: 81.0000 mg | DELAYED_RELEASE_TABLET | Freq: Every day | ORAL | Status: DC

## 2018-10-03 MED ORDER — BLOOD PRESSURE MONITOR KIT: 1.0000 | PACK | 0 refills | Status: DC

## 2018-10-03 NOTE — Progress Notes (Signed)
Pt presents for New OB. Pt had IVF, with donor egg of 45 year old. Pt has hx of hypertension. She complains of having a lot of nausea, but no other concerns.

## 2018-10-03 NOTE — Patient Instructions (Signed)
Morning Sickness  Morning sickness is when you feel sick to your stomach (nauseous) during pregnancy. You may feel sick to your stomach and throw up (vomit). You may feel sick in the morning, but you can feel this way at any time of day. Some women feel very sick to their stomach and cannot stop throwing up (hyperemesis gravidarum). Follow these instructions at home: Medicines  Take over-the-counter and prescription medicines only as told by your doctor. Do not take any medicines until you talk with your doctor about them first.  Taking multivitamins before getting pregnant can stop or lessen the harshness of morning sickness. Eating and drinking  Eat dry toast or crackers before getting out of bed.  Eat 5 or 6 small meals a day.  Eat dry and bland foods like rice and baked potatoes.  Do not eat greasy, fatty, or spicy foods.  Have someone cook for you if the smell of food causes you to feel sick or throw up.  If you feel sick to your stomach after taking prenatal vitamins, take them at night or with a snack.  Eat protein when you need a snack. Nuts, yogurt, and cheese are good choices.  Drink fluids throughout the day.  Try ginger ale made with real ginger, ginger tea made from fresh grated ginger, or ginger candies. General instructions  Do not use any products that have nicotine or tobacco in them, such as cigarettes and e-cigarettes. If you need help quitting, ask your doctor.  Use an air purifier to keep the air in your house free of smells.  Get lots of fresh air.  Try to avoid smells that make you feel sick.  Try: ? Wearing a bracelet that is used for seasickness (acupressure wristband). ? Going to a doctor who puts thin needles into certain body points (acupuncture) to improve how you feel. Contact a doctor if:  You need medicine to feel better.  You feel dizzy or light-headed.  You are losing weight. Get help right away if:  You feel very sick to your  stomach and cannot stop throwing up.  You pass out (faint).  You have very bad pain in your belly. Summary  Morning sickness is when you feel sick to your stomach (nauseous) during pregnancy.  You may feel sick in the morning, but you can feel this way at any time of day.  Making some changes to what you eat may help your symptoms go away. This information is not intended to replace advice given to you by your health care provider. Make sure you discuss any questions you have with your health care provider. Document Released: 04/20/2004 Document Revised: 02/23/2017 Document Reviewed: 04/13/2016 Elsevier Patient Education  2020 [employer].

## 2018-10-03 NOTE — Progress Notes (Signed)
   Subjective:   Victoria George is a 43 y.o. G1P0 at [redacted]w[redacted]d by 7wk U/S (IVF)  being seen today for her first obstetrical visit.  Her obstetrical history is significant for AMA, IVF pregnancy with donor egg, obesity, cHTN. Patient does intend to breast feed. Pregnancy history fully reviewed.  Patient reports occasional nausea/vomiting, feeling tired.  HISTORY: OB History  Gravida Para Term Preterm AB Living  1 0 0 0 0 0  SAB TAB Ectopic Multiple Live Births  0 0 0 0 0    # Outcome Date GA Lbr Len/2nd Weight Sex Delivery Anes PTL Lv  1 Current            Last pap smear was  1/19 and was normal Past Medical History:  Diagnosis Date  . Anemia 03/02/2011  . History of gastritis    12/ 2012  . History of ovarian cyst   . HTN (hypertension) 03/02/2011  . Hypertension   . Iron deficiency anemia   . Moderate persistent asthma, uncomplicated    [facility]-- dr padgett  . Seasonal allergic conjunctivitis   . Seasonal and perennial allergic rhinitis   . Submucous leiomyoma of uterus   . Wears glasses    Past Surgical History:  Procedure Laterality Date  . CARDIOVASCULAR STRESS TEST    . CHOLECYSTECTOMY  02/01/2011   Procedure: LAPAROSCOPIC CHOLECYSTECTOMY WITH INTRAOPERATIVE CHOLANGIOGRAM;  Surgeon: Todd J Rosenbower, MD;  Location: [facility];  Service: General;  Laterality: N/A;  Needs IOC and C-arm  . DIAGNOSTIC LAPAROSCOPY  07/2007   ?Lysis Adhesions for obstructive right fallopian tube  . ESOPHAGOGASTRODUODENOSCOPY  01/30/2011   Procedure: ESOPHAGOGASTRODUODENOSCOPY (EGD);  Surgeon: Jay Pyrtle, MD;  Location: [facility];  Service: Gastroenterology;  Laterality: N/A;  . ESOPHAGOGASTRODUODENOSCOPY  03/03/2011   Procedure: ESOPHAGOGASTRODUODENOSCOPY (EGD);  Surgeon: Jay Pyrtle, MD;  Location: [facility];  Service: Gastroenterology;  Laterality: N/A;  . HYSTEROSCOPY N/A 03/22/2016   Procedure: HYSTEROSCOPY, POLYPECTOMY;  Surgeon: Tamer Yalcinkaya, MD;  Location: [facility];  Service: Gynecology;  Laterality: N/A;  . HYSTEROSCOPY WITH RESECTOSCOPE N/A 09/19/2017   Procedure: HYSTEROSCOPY MYOMECTOMY;  Surgeon: Yalcinkaya, Tamer, MD;  Location: [facility];  Service: Gynecology;  Laterality: N/A;  . LAPAROSCOPIC GELPORT ASSISTED MYOMECTOMY N/A 03/22/2016   Procedure: LAPAROSCOPIC GELPORT ASSISTED MYOMECTOMY, LYSIS OF ADHESIONS, CHROMOPERTUBATION;  Surgeon: Tamer Yalcinkaya, MD;  Location: [facility];  Service: Gynecology;  Laterality: N/A;  . TRANSTHORACIC ECHOCARDIOGRAM     Family History  Problem Relation Age of Onset  . Hypertension Mother   . Colon cancer Maternal Grandmother   . Diabetes Maternal Grandmother   . Kidney disease Maternal Grandmother   . Diabetes Brother   . Diabetes Paternal Grandmother   . Diverticulosis Paternal Grandmother   . Anesthesia problems Neg Hx   . Malignant hyperthermia Neg Hx   . Pseudochol deficiency Neg Hx    Social History   Tobacco Use  . Smoking status: Never Smoker  . Smokeless tobacco: Never Used  Substance Use Topics  . Alcohol use: Not Currently    Comment: last drink 2 years ago  . Drug use: No   Allergies  Allergen Reactions  . Phenergan [Promethazine] Shortness Of Breath and Palpitations  . Shellfish Allergy Anaphylaxis    ALL SHELLFISH  . Codeine Hives  . Milk-Related Compounds Itching and Swelling    All dairy products  . Other Hives and Swelling    Oranges,  pineapples  .   Oxycodone Other (See Comments)    Percocet-- "insomnia"  . Tramadol Other (See Comments)    Ultram-- " manic episode"  . Hydrocodone Hives and Itching    vicodin   Current Outpatient Medications on File Prior to Visit  Medication Sig Dispense Refill  . albuterol (PROVENTIL HFA;VENTOLIN HFA) 108 (90 Base) MCG/ACT inhaler Inhale 2 puffs into the lungs every 6 (six) hours as needed for wheezing or shortness of breath. 18 g 3  . budesonide-formoterol (SYMBICORT) 80-4.5 MCG/ACT  inhaler Inhale 2 puffs into the lungs every morning. (Patient taking differently: Inhale 2 puffs into the lungs every morning. ) 1 Inhaler 5  . Calcium 600-400 MG-UNIT CHEW Chew by mouth daily.    . Cholecalciferol (VITAMIN D3) 2000 units TABS Take 1 tablet by mouth daily.    . Prenatal Vit-Fe Fumarate-FA (PRENATAL VITAMINS PO) Take by mouth.    . progesterone (PROMETRIUM) 200 MG capsule Take 200 mg by mouth at bedtime.    . Coenzyme Q10 (COQ-10) 200 MG CAPS Take 1 capsule by mouth every morning.    Marland Kitchen EPINEPHrine 0.3 mg/0.3 mL IJ SOAJ injection Inject into the muscle once.    . Multiple Vitamin (MULTIVITAMIN WITH MINERALS) TABS tablet Take 1 tablet by mouth daily.     No current facility-administered medications on file prior to visit.      Exam   Vitals:   10/03/18 1418  BP: (!) 150/93  Pulse: (!) 101  Weight: 242 lb 9.6 oz (110 kg)   Fetal Heart Rate (bpm): (unable to obtain with Doppler because of body habitus)FHR: unable to obtain by Doppler or transabdominal U/S because of body habitus  Pelvic Exam:   deferred  System: General: well-developed, well-nourished female in no acute distress   Breast:  normal appearance, no masses or tenderness   Skin: normal coloration and turgor, no rashes   Neurologic: oriented, normal, negative, normal mood   Extremities: normal strength, tone, and muscle mass, ROM of all joints is normal   HEENT PERRL, extraocular movement intact and sclera clear, anicteric   Mouth/Teeth mucous membranes moist, pharynx normal without lesions and dental hygiene good   Neck supple and no masses   Cardiovascular: regular rate    Respiratory:  no respiratory distress   Abdomen: soft, non-tender; obese      Assessment:   Pregnancy: G1P0 Patient Active Problem List   Diagnosis Date Noted  . Obesity affecting pregnancy, antepartum 10/06/2018  . In vitro fertilization 10/05/2018  . Chronic hypertension affecting pregnancy 10/03/2018  . Supervision of high  risk pregnancy, antepartum 10/03/2018  . Seasonal and perennial allergic rhinitis 05/03/2017  . Moderate persistent asthma without complication 76/22/6333  . Seasonal allergic conjunctivitis 03/01/2017  . Anaphylactic shock due to adverse food reaction 03/01/2017  . Chronic urticaria 03/01/2017  . Nausea and vomiting during pregnancy 03/02/2011     Plan:  1. Supervision of high risk pregnancy, antepartum - Cervicovaginal ancillary only( [place] [facility]) - Obstetric Panel, Including HIV - Culture, OB Urine - Genetic Screening - Babyscripts Schedule Optimization - Blood Pressure Monitor KIT; 1 kit by Does not apply route once a week.  Dispense: 1 kit; Refill: 0 - [place] MFM OB 11-14 WEEK ANATOMY; Future - confirmation of viability U/S to be done next week, unable to visualize FHR today because of body habitus (only transabdominal probe available)  - Continue prenatal vitamins. - Genetic Screening completed with fertility specialist - Problem list reviewed and updated. - The nature of [facility]  [place] with multiple MDs and other Advanced Practice Providers was explained to patient; also emphasized that residents, students are part of our team. - Routine obstetric precautions reviewed.  2. Chronic hypertension affecting pregnancy: Asymptomatic today. Takes BP regularly at home - was 116/73 this morning. States often elevated in clinic settings.  BP Readings from Last 3 Encounters:  10/03/18 (!) 150/93  03/25/18 (!) 130/91  09/19/17 (!) 142/93  - discussed likelihood of starting BP medication this pregnancy, will bring cuff to next visit to be able to compare to our values here  - Babyscripts Schedule Optimization - Blood Pressure Monitor KIT; 1 kit by Does not apply route once a week.  Dispense: 1 kit; Refill: 0 - Comp Met (CMET) - Protein / creatinine ratio, urine - aspirin EC 81 MG tablet; Take 1 tablet (81 mg total) by mouth daily.  Dispense: 30 tablet -  instructed to start at 12 weeks   Future Appointments  Date Time Provider [place]  10/09/2018  2:00 PM [facility] [place]. Juleen China, DO OB/GYN Fellow

## 2018-10-04 LAB — OBSTETRIC PANEL, INCLUDING HIV
Antibody Screen: NEGATIVE
Basophils Absolute: 0 10*3/uL (ref 0.0–0.2)
Basos: 1 %
EOS (ABSOLUTE): 0.1 10*3/uL (ref 0.0–0.4)
Eos: 1 %
HIV Screen 4th Generation wRfx: NONREACTIVE
Hematocrit: 35.5 % (ref 34.0–46.6)
Hemoglobin: 12.4 g/dL (ref 11.1–15.9)
Hepatitis B Surface Ag: NEGATIVE
Immature Grans (Abs): 0 10*3/uL (ref 0.0–0.1)
Immature Granulocytes: 0 %
Lymphocytes Absolute: 2 10*3/uL (ref 0.7–3.1)
Lymphs: 44 %
MCH: 31 pg (ref 26.6–33.0)
MCHC: 34.9 g/dL (ref 31.5–35.7)
MCV: 89 fL (ref 79–97)
Monocytes Absolute: 0.5 10*3/uL (ref 0.1–0.9)
Monocytes: 11 %
Neutrophils Absolute: 2 10*3/uL (ref 1.4–7.0)
Neutrophils: 43 %
Platelets: 293 10*3/uL (ref 150–450)
RBC: 4 x10E6/uL (ref 3.77–5.28)
RDW: 12.4 % (ref 11.7–15.4)
RPR Ser Ql: NONREACTIVE
Rh Factor: POSITIVE
Rubella Antibodies, IGG: 8.36 index (ref >0.99)
WBC: 4.6 10*3/uL (ref 3.4–10.8)

## 2018-10-04 LAB — CERVICOVAGINAL ANCILLARY ONLY
Bacterial vaginitis: NEGATIVE
Candida vaginitis: NEGATIVE
Chlamydia: NEGATIVE
Neisseria Gonorrhea: NEGATIVE
Trichomonas: NEGATIVE

## 2018-10-04 LAB — COMPREHENSIVE METABOLIC PANEL
ALT: 17 IU/L (ref 0–32)
AST: 19 IU/L (ref 0–40)
Albumin/Globulin Ratio: 1.4 (ref 1.2–2.2)
Albumin: 4.1 g/dL (ref 3.8–4.8)
Alkaline Phosphatase: 68 IU/L (ref 39–117)
BUN/Creatinine Ratio: 7 — ABNORMAL LOW (ref 9–23)
BUN: 6 mg/dL (ref 6–24)
Bilirubin Total: <0.2 mg/dL (ref 0.0–1.2)
CO2: 21 mmol/L (ref 20–29)
Calcium: 9.3 mg/dL (ref 8.7–10.2)
Chloride: 103 mmol/L (ref 96–106)
Creatinine, Ser: 0.84 mg/dL (ref 0.57–1.00)
GFR calc Af Amer: 97 mL/min/{1.73_m2} (ref >59)
GFR calc non Af Amer: 84 mL/min/{1.73_m2} (ref >59)
Globulin, Total: 3 g/dL (ref 1.5–4.5)
Glucose: 86 mg/dL (ref 65–99)
Potassium: 4.1 mmol/L (ref 3.5–5.2)
Sodium: 135 mmol/L (ref 134–144)
Total Protein: 7.1 g/dL (ref 6.0–8.5)

## 2018-10-04 LAB — PROTEIN / CREATININE RATIO, URINE
Creatinine, Urine: 76.3 mg/dL
Protein, Ur: 6.4 mg/dL
Protein/Creat Ratio: 84 mg/g creat (ref 0–200)

## 2018-10-05 DIAGNOSIS — Z3183 Encounter for assisted reproductive fertility procedure cycle: Secondary | ICD-10-CM | POA: Insufficient documentation

## 2018-10-06 DIAGNOSIS — O99210 Obesity complicating pregnancy, unspecified trimester: Secondary | ICD-10-CM | POA: Insufficient documentation

## 2018-10-06 LAB — CULTURE, OB URINE

## 2018-10-06 LAB — URINE CULTURE, OB REFLEX

## 2018-10-09 ENCOUNTER — Ambulatory Visit
Admission: RE | Admit: 2018-10-09 | Discharge: 2018-10-09 | Disposition: A | Discharge status: Home / Self Care | Payer: PRIVATE HEALTH INSURANCE | Source: Ambulatory Visit | Attending: Family Medicine | Admitting: Family Medicine

## 2018-10-09 ENCOUNTER — Other Ambulatory Visit: Payer: Self-pay

## 2018-10-09 DIAGNOSIS — O0990 Supervision of high risk pregnancy, unspecified, unspecified trimester: Secondary | ICD-10-CM | POA: Insufficient documentation

## 2018-10-16 DIAGNOSIS — D259 Leiomyoma of uterus, unspecified: Secondary | ICD-10-CM | POA: Insufficient documentation

## 2018-10-31 ENCOUNTER — Encounter: Payer: PRIVATE HEALTH INSURANCE | Admitting: Certified Nurse Midwife

## 2019-02-08 ENCOUNTER — Other Ambulatory Visit: Payer: Self-pay

## 2019-02-08 ENCOUNTER — Inpatient Hospital Stay (HOSPITAL_COMMUNITY)
Admission: AD | Admit: 2019-02-08 | Discharge: 2019-02-10 | DRG: 833 | Disposition: A | Discharge status: Home / Self Care | Payer: PRIVATE HEALTH INSURANCE | Attending: Obstetrics and Gynecology | Admitting: Obstetrics and Gynecology

## 2019-02-08 ENCOUNTER — Encounter (HOSPITAL_COMMUNITY): Payer: Self-pay

## 2019-02-08 DIAGNOSIS — O119 Pre-existing hypertension with pre-eclampsia, unspecified trimester: Secondary | ICD-10-CM | POA: Diagnosis present

## 2019-02-08 DIAGNOSIS — O09523 Supervision of elderly multigravida, third trimester: Secondary | ICD-10-CM

## 2019-02-08 DIAGNOSIS — O10013 Pre-existing essential hypertension complicating pregnancy, third trimester: Secondary | ICD-10-CM | POA: Diagnosis present

## 2019-02-08 DIAGNOSIS — O113 Pre-existing hypertension with pre-eclampsia, third trimester: Principal | ICD-10-CM | POA: Diagnosis present

## 2019-02-08 DIAGNOSIS — Z3A29 29 weeks gestation of pregnancy: Secondary | ICD-10-CM

## 2019-02-08 DIAGNOSIS — O10913 Unspecified pre-existing hypertension complicating pregnancy, third trimester: Secondary | ICD-10-CM | POA: Diagnosis not present

## 2019-02-08 DIAGNOSIS — O99213 Obesity complicating pregnancy, third trimester: Secondary | ICD-10-CM | POA: Diagnosis not present

## 2019-02-08 DIAGNOSIS — O3413 Maternal care for benign tumor of corpus uteri, third trimester: Secondary | ICD-10-CM | POA: Diagnosis not present

## 2019-02-08 DIAGNOSIS — O09513 Supervision of elderly primigravida, third trimester: Secondary | ICD-10-CM | POA: Diagnosis not present

## 2019-02-08 DIAGNOSIS — D259 Leiomyoma of uterus, unspecified: Secondary | ICD-10-CM | POA: Diagnosis not present

## 2019-02-08 DIAGNOSIS — Z20828 Contact with and (suspected) exposure to other viral communicable diseases: Secondary | ICD-10-CM | POA: Diagnosis present

## 2019-02-08 LAB — ABO/RH: ABO/RH(D): O POS

## 2019-02-08 LAB — COMPREHENSIVE METABOLIC PANEL
ALT: 36 U/L (ref 0–44)
ALT: 36 U/L (ref 0–44)
AST: 44 U/L — ABNORMAL HIGH (ref 15–41)
AST: 43 U/L — ABNORMAL HIGH (ref 15–41)
Albumin: 2.7 g/dL — ABNORMAL LOW (ref 3.5–5.0)
Albumin: 2.8 g/dL — ABNORMAL LOW (ref 3.5–5.0)
Alkaline Phosphatase: 81 U/L (ref 38–126)
Alkaline Phosphatase: 85 U/L (ref 38–126)
Anion gap: 7 (ref 5–15)
Anion gap: 11 (ref 5–15)
BUN: 6 mg/dL (ref 6–20)
BUN: 7 mg/dL (ref 6–20)
CO2: 22 mmol/L (ref 22–32)
CO2: 19 mmol/L — ABNORMAL LOW (ref 22–32)
Calcium: 9.2 mg/dL (ref 8.9–10.3)
Calcium: 8.9 mg/dL (ref 8.9–10.3)
Chloride: 106 mmol/L (ref 98–111)
Chloride: 104 mmol/L (ref 98–111)
Creatinine, Ser: 0.7 mg/dL (ref 0.44–1.00)
Creatinine, Ser: 0.64 mg/dL (ref 0.44–1.00)
GFR calc Af Amer: >60 mL/min (ref >60)
GFR calc Af Amer: >60 mL/min (ref >60)
GFR calc non Af Amer: >60 mL/min (ref >60)
GFR calc non Af Amer: >60 mL/min (ref >60)
Glucose, Bld: 99 mg/dL (ref 70–99)
Glucose, Bld: 124 mg/dL — ABNORMAL HIGH (ref 70–99)
Potassium: 3.4 mmol/L — ABNORMAL LOW (ref 3.5–5.1)
Potassium: 3.6 mmol/L (ref 3.5–5.1)
Sodium: 135 mmol/L (ref 135–145)
Sodium: 134 mmol/L — ABNORMAL LOW (ref 135–145)
Total Bilirubin: 0.4 mg/dL (ref 0.3–1.2)
Total Bilirubin: 0.4 mg/dL (ref 0.3–1.2)
Total Protein: 6.1 g/dL — ABNORMAL LOW (ref 6.5–8.1)
Total Protein: 6.6 g/dL (ref 6.5–8.1)

## 2019-02-08 LAB — URINALYSIS, ROUTINE W REFLEX MICROSCOPIC
Bilirubin Urine: NEGATIVE
Glucose, UA: NEGATIVE mg/dL
Hgb urine dipstick: NEGATIVE
Ketones, ur: 5 mg/dL — AB
Leukocytes,Ua: NEGATIVE
Nitrite: NEGATIVE
Protein, ur: 100 mg/dL — AB
Specific Gravity, Urine: 1.019 (ref 1.005–1.030)
pH: 5 (ref 5.0–8.0)

## 2019-02-08 LAB — CBC
HCT: 32.9 % — ABNORMAL LOW (ref 36.0–46.0)
HCT: 33.1 % — ABNORMAL LOW (ref 36.0–46.0)
Hemoglobin: 11 g/dL — ABNORMAL LOW (ref 12.0–15.0)
Hemoglobin: 11.4 g/dL — ABNORMAL LOW (ref 12.0–15.0)
MCH: 30.3 pg (ref 26.0–34.0)
MCH: 31.1 pg (ref 26.0–34.0)
MCHC: 33.4 g/dL (ref 30.0–36.0)
MCHC: 34.4 g/dL (ref 30.0–36.0)
MCV: 90.6 fL (ref 80.0–100.0)
MCV: 90.4 fL (ref 80.0–100.0)
Platelets: 272 10*3/uL (ref 150–400)
Platelets: 269 10*3/uL (ref 150–400)
RBC: 3.63 MIL/uL — ABNORMAL LOW (ref 3.87–5.11)
RBC: 3.66 MIL/uL — ABNORMAL LOW (ref 3.87–5.11)
RDW: 13.2 % (ref 11.5–15.5)
RDW: 13.2 % (ref 11.5–15.5)
WBC: 5.3 10*3/uL (ref 4.0–10.5)
WBC: 6 10*3/uL (ref 4.0–10.5)
nRBC: 0 % (ref 0.0–0.2)
nRBC: 0 % (ref 0.0–0.2)

## 2019-02-08 LAB — PROTEIN / CREATININE RATIO, URINE
Creatinine, Urine: 209.81 mg/dL
Protein Creatinine Ratio: 0.69 mg/mg{Cre} — ABNORMAL HIGH (ref 0.00–0.15)
Total Protein, Urine: 145 mg/dL

## 2019-02-08 LAB — TYPE AND SCREEN
ABO/RH(D): O POS
Antibody Screen: NEGATIVE

## 2019-02-08 LAB — SARS CORONAVIRUS 2 (TAT 6-24 HRS): SARS Coronavirus 2: NEGATIVE

## 2019-02-08 MED ORDER — LACTATED RINGERS IV SOLN
INTRAVENOUS | Status: DC
  Administered 2019-02-08 – 2019-02-09 (×2): via INTRAVENOUS

## 2019-02-08 MED ORDER — ACETAMINOPHEN 325 MG PO TABS: 650.0000 mg | ORAL_TABLET | ORAL | Status: DC | PRN

## 2019-02-08 MED ORDER — PRENATAL VITAMINS 28-0.8 MG PO TABS: ORAL_TABLET | Freq: Once | ORAL | Status: DC

## 2019-02-08 MED ORDER — CALCIUM CARBONATE ANTACID 500 MG PO CHEW
2.0000 | CHEWABLE_TABLET | ORAL | Status: DC | PRN
  Administered 2019-02-09: 400 mg via ORAL
  Filled 2019-02-08 (×2): qty 2

## 2019-02-08 MED ORDER — LABETALOL HCL 5 MG/ML IV SOLN
20.0000 mg | INTRAVENOUS | Status: DC | PRN
  Administered 2019-02-08 (×2): 20 mg via INTRAVENOUS
  Filled 2019-02-08 (×2): qty 4

## 2019-02-08 MED ORDER — ZOLPIDEM TARTRATE 5 MG PO TABS: 5.0000 mg | ORAL_TABLET | Freq: Every evening | ORAL | Status: DC | PRN

## 2019-02-08 MED ORDER — LABETALOL HCL 5 MG/ML IV SOLN: 80.0000 mg | INTRAVENOUS | Status: DC | PRN

## 2019-02-08 MED ORDER — MAGNESIUM SULFATE 40 GM/1000ML IV SOLN
2.0000 g/h | INTRAVENOUS | Status: DC
  Administered 2019-02-08 – 2019-02-09 (×2): 2 g/h via INTRAVENOUS
  Filled 2019-02-08: qty 1000

## 2019-02-08 MED ORDER — MAGNESIUM SULFATE 40 GM/1000ML IV SOLN
INTRAVENOUS | Status: AC
  Filled 2019-02-08: qty 1000

## 2019-02-08 MED ORDER — BETAMETHASONE SOD PHOS & ACET 6 (3-3) MG/ML IJ SUSP
12.0000 mg | INTRAMUSCULAR | Status: AC
  Administered 2019-02-08 – 2019-02-09 (×2): 12 mg via INTRAMUSCULAR
  Filled 2019-02-08: qty 5

## 2019-02-08 MED ORDER — LABETALOL HCL 5 MG/ML IV SOLN
40.0000 mg | INTRAVENOUS | Status: DC | PRN
  Administered 2019-02-08: 40 mg via INTRAVENOUS
  Filled 2019-02-08 (×2): qty 8

## 2019-02-08 MED ORDER — SODIUM CHLORIDE 0.9 % IV SOLN: 250.0000 mL | INTRAVENOUS | Status: DC | PRN

## 2019-02-08 MED ORDER — SODIUM CHLORIDE 0.9% FLUSH
3.0000 mL | Freq: Two times a day (BID) | INTRAVENOUS | Status: DC
  Administered 2019-02-09 – 2019-02-10 (×2): 3 mL via INTRAVENOUS

## 2019-02-08 MED ORDER — MAGNESIUM SULFATE BOLUS VIA INFUSION
4.0000 g | Freq: Once | INTRAVENOUS | Status: AC
  Administered 2019-02-08: 4 g via INTRAVENOUS
  Filled 2019-02-08: qty 1000

## 2019-02-08 MED ORDER — SODIUM CHLORIDE 0.9% FLUSH: 3.0000 mL | INTRAVENOUS | Status: DC | PRN

## 2019-02-08 MED ORDER — NIFEDIPINE ER OSMOTIC RELEASE 30 MG PO TB24: 60.0000 mg | ORAL_TABLET | Freq: Every day | ORAL | Status: DC

## 2019-02-08 MED ORDER — ASPIRIN EC 81 MG PO TBEC
81.0000 mg | DELAYED_RELEASE_TABLET | Freq: Every day | ORAL | Status: DC
  Administered 2019-02-08 – 2019-02-10 (×3): 81 mg via ORAL
  Filled 2019-02-08 (×3): qty 1

## 2019-02-08 MED ORDER — NIFEDIPINE ER OSMOTIC RELEASE 30 MG PO TB24
60.0000 mg | ORAL_TABLET | Freq: Two times a day (BID) | ORAL | Status: DC
  Administered 2019-02-08 – 2019-02-10 (×4): 60 mg via ORAL
  Filled 2019-02-08 (×4): qty 2

## 2019-02-08 MED ORDER — HYDRALAZINE HCL 20 MG/ML IJ SOLN: 10.0000 mg | INTRAMUSCULAR | Status: DC | PRN

## 2019-02-08 MED ORDER — PRENATAL MULTIVITAMIN CH
1.0000 | ORAL_TABLET | Freq: Every day | ORAL | Status: DC
  Administered 2019-02-09 – 2019-02-10 (×2): 1 via ORAL
  Filled 2019-02-08 (×3): qty 1

## 2019-02-08 MED ORDER — DOCUSATE SODIUM 100 MG PO CAPS
100.0000 mg | ORAL_CAPSULE | Freq: Every day | ORAL | Status: DC
  Administered 2019-02-08 – 2019-02-10 (×3): 100 mg via ORAL
  Filled 2019-02-08 (×3): qty 1

## 2019-02-08 NOTE — MAU Note (Signed)
RN to d/c bp checks per CNM

## 2019-02-08 NOTE — H&P (Addendum)
Victoria George is a 43 y.o. female  G1P0 at 73+ with CHTN, worsening BP; superimposed PreE PCR from .24 -.6.  Pt called with BP 192/114, 155/104 - advised eval at MAU.  Pressures severe range in MAU - treated with IV meds.  Pregnancy from IVF.  Pt has h/o myomectomy and will need C/S for delivery.  Had preimplantation genetics and is AMA.   Nifedipine adjusted at last Prenatal visit.  To 7m XL day.  To receive BMZ - MG for CP prophylaxis and PreE.  Recheck LFTs in AM  OB History    Gravida  1   Para      Term      Preterm      AB      Living        SAB      TAB      Ectopic      Multiple      Live Births            G1 present, IVF  No abn pap H/o Chl  Past Medical History:  Diagnosis Date  . Anemia 03/02/2011  . History of gastritis    12/ 2012  . History of ovarian cyst   . HTN (hypertension) 03/02/2011  . Hypertension   . Iron deficiency anemia   . Moderate persistent asthma, uncomplicated    all[facility] asthma center-- dr pNelva Bush . Seasonal allergic conjunctivitis   . Seasonal and perennial allergic rhinitis   . Submucous leiomyoma of uterus   . Wears glasses    Past Surgical History:  Procedure Laterality Date  . CARDIOVASCULAR STRESS TEST    . CHOLECYSTECTOMY  02/01/2011   Procedure: LAPAROSCOPIC CHOLECYSTECTOMY WITH INTRAOPERATIVE CHOLANGIOGRAM;  Surgeon: TOdis Hollingshead MD;  [facility];  Service: General;  Laterality: N/A;  Needs IOC and C-arm  . DIAGNOSTIC LAPAROSCOPY  07/2007   ?Lysis Adhesions for obstructive right fallopian tube  . ESOPHAGOGASTRODUODENOSCOPY  01/30/2011   Procedure: ESOPHAGOGASTRODUODENOSCOPY (EGD);  Surgeon: JZenovia Jarred MD;  [facility];  Service: Gastroenterology;  Laterality: N/A;  . ESOPHAGOGASTRODUODENOSCOPY  03/03/2011   Procedure: ESOPHAGOGASTRODUODENOSCOPY (EGD);  Surgeon: JZenovia Jarred MD;  [facility];  Service: Gastroenterology;  Laterality: N/A;  . HYSTEROSCOPY N/A 03/22/2016   Procedure:  HYSTEROSCOPY, POLYPECTOMY;  Surgeon: TGovernor Specking MD;  [facility]  Service: Gynecology;  Laterality: N/A;  . HYSTEROSCOPY WITH RESECTOSCOPE N/A 09/19/2017   Procedure: HYSTEROSCOPY MYOMECTOMY;  Surgeon: YGovernor Specking MD;  [facility]  Service: Gynecology;  Laterality: N/A;  . LAPAROSCOPIC GELPORT ASSISTED MYOMECTOMY N/A 03/22/2016   Procedure: LAPAROSCOPIC GELPORT ASSISTED MYOMECTOMY, LYSIS OF ADHESIONS, CHROMOPERTUBATION;  Surgeon: TGovernor Specking MD;  [place] [facility]  Service: Gynecology;  Laterality: N/A;  . TRANSTHORACIC ECHOCARDIOGRAM     Family History: family history includes Colon cancer in her maternal grandmother; Diabetes in her brother, maternal grandmother, and paternal grandmother; Diverticulosis in her paternal grandmother; Hypertension in her mother; Kidney disease in her maternal grandmother. Social History:  reports that she has never smoked. She has never used smokeless tobacco. She reports previous alcohol use. 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   HENT: Negative.   Eyes: Negative.   Respiratory: Negative.   Cardiovascular: Negative.   Gastrointestinal: Negative.   Genitourinary: Negative.   Musculoskeletal: Negative.   Skin: Negative.   Neurological: Negative.   Psychiatric/Behavioral: Negative.    Maternal Medical History:  Fetal activity: Perceived fetal activity is normal.    Prenatal complications:  PIH and pre-eclampsia.   Prenatal Complications - Diabetes: none.      Blood pressure (!) 146/90, pulse 84, temperature 98.2 F (36.8 C), temperature source Oral, resp. rate 16, height 5' 2"  (1.575 m), weight 112 kg, SpO2 100 %. Maternal Exam:  Abdomen: Patient reports no abdominal tenderness. Fundal height is appropriate for gestation.   Estimated fetal weight is 2-2.5#.    Introitus: Normal vulva. Normal vagina.    Physical Exam  Constitutional: She is oriented to person, place, and time. She appears well-developed and well-nourished.  HENT:  Head: Normocephalic and atraumatic.  Cardiovascular: Normal rate and regular rhythm.  Respiratory: Effort normal and breath sounds normal. No respiratory distress. She has no wheezes.  GI: Soft. Bowel sounds are normal. She exhibits no distension. There is no abdominal tenderness.  Genitourinary:    Vulva normal.   Musculoskeletal: Normal range of motion.  Neurological: She is alert and oriented to person, place, and time.  Skin: Skin is warm and dry.  Psychiatric: She has a normal mood and affect. Her behavior is normal.    Prenatal labs: ABO, Rh: --/--/O POS, O POS Performed at [facility], [place] [address], [place], [place] [address]  [phone]) Antibody: NEG (11/14 1537) Rubella: 8.36 (07/09 1504) RPR: Non Reactive (07/09 1504)  HBsAg: Negative (07/09 1504)  HIV: Non Reactive (07/09 1504)  GBS:   unknown  Declined Flu Tdap 11/4 [facility] 1/28  Hgb 12.4/Plt 293/Chl neg/GC neg/Pap WNL/glucola 124/baseline PCR 0.24  [place] nl anat, limited heart, post plac, female F/u US completes anat EIF  Assessment/Plan: 43yo G1 at 29+ with CHTN, SIPE admitted for BP control BMZ for lung maturity (Mg for CP prophylaxis, PreE) Adjust meds Follow labs - LFTs  Delainie Chavana Bovard-Stuckert 02/08/2019, 8:43 PM

## 2019-02-08 NOTE — MAU Provider Note (Signed)
Chief Complaint:  Hypertension   First Provider Initiated Contact with Patient 02/08/19 1001      HPI: Victoria George is a 43 y.o. G1P0 at 43w2dby early ultrasound pregnancy complicated by IVF/donor egg, asthma, and CHTN who presents to maternity admissions reporting home BP 170s/110s.  She denies any h/a, epigastric pain or visual disturbances.  She reports this is a new cuff used for the first time yesterday and today. She was using a wrist cuff but her office encouraged her to get an arm cuff to be more accurate.  Her wrist cuff was measuring her BP 150s/90s at home today but the arm cuff was significantly higher.  She is taking Procardia 60XL daily and took her medication last night.  There are no other symptoms. She has not tried any other treatments. She reports good fetal movement.  Per Dr BSandford Crazept had recent preeclampsia labs with PC ratio of 0.21.    HPI  Past Medical History: Past Medical History:  Diagnosis Date  . Anemia 03/02/2011  . History of gastritis    12/ 2012  . History of ovarian cyst   . HTN (hypertension) 03/02/2011  . Hypertension   . Iron deficiency anemia   . Moderate persistent asthma, uncomplicated    [facility] dr pNelva Bush . Seasonal allergic conjunctivitis   . Seasonal and perennial allergic rhinitis   . Submucous leiomyoma of uterus   . Wears glasses     Past obstetric history: OB History  Gravida Para Term Preterm AB Living  1            SAB TAB Ectopic Multiple Live Births               # Outcome Date GA Lbr Len/2nd Weight Sex Delivery Anes PTL Lv  1 Current             Past Surgical History: Past Surgical History:  Procedure Laterality Date  . CARDIOVASCULAR STRESS TEST    . CHOLECYSTECTOMY  02/01/2011   Procedure: LAPAROSCOPIC CHOLECYSTECTOMY WITH INTRAOPERATIVE CHOLANGIOGRAM;  Surgeon: TOdis Hollingshead MD;  Location: [facility];  Service: General;  Laterality: N/A;  Needs IOC and C-arm  . DIAGNOSTIC LAPAROSCOPY   07/2007   ?Lysis Adhesions for obstructive right fallopian tube  . ESOPHAGOGASTRODUODENOSCOPY  01/30/2011   Procedure: ESOPHAGOGASTRODUODENOSCOPY (EGD);  Surgeon: JZenovia Jarred MD;  [facility];  Service: Gastroenterology;  Laterality: N/A;  . ESOPHAGOGASTRODUODENOSCOPY  03/03/2011   Procedure: ESOPHAGOGASTRODUODENOSCOPY (EGD);  Surgeon: JZenovia Jarred MD;  [facility];  Service: Gastroenterology;  Laterality: N/A;  . HYSTEROSCOPY N/A 03/22/2016   Procedure: HYSTEROSCOPY, POLYPECTOMY;  Surgeon: TGovernor Specking MD;  [place] [facility]  Service: Gynecology;  Laterality: N/A;  . HYSTEROSCOPY WITH RESECTOSCOPE N/A 09/19/2017   Procedure: HYSTEROSCOPY MYOMECTOMY;  Surgeon: YGovernor Specking MD;  [facility]  Service: Gynecology;  Laterality: N/A;  . LAPAROSCOPIC GELPORT ASSISTED MYOMECTOMY N/A 03/22/2016   Procedure: LAPAROSCOPIC GELPORT ASSISTED MYOMECTOMY, LYSIS OF ADHESIONS, CHROMOPERTUBATION;  Surgeon: TGovernor Specking MD;  [place] [facility]  Service: Gynecology;  Laterality: N/A;  . TRANSTHORACIC ECHOCARDIOGRAM      Family History: Family History  Problem Relation Age of Onset  . Hypertension Mother   . Colon cancer Maternal Grandmother   . Diabetes Maternal Grandmother   . Kidney disease Maternal Grandmother   . Diabetes Brother   . Diabetes Paternal Grandmother   . Diverticulosis Paternal Grandmother   . Anesthesia problems  Neg Hx   . Malignant hyperthermia Neg Hx   . Pseudochol deficiency Neg Hx     Social History: Social History   Tobacco Use  . Smoking status: Never Smoker  . Smokeless tobacco: Never Used  Substance Use Topics  . Alcohol use: Not Currently    Comment: last drink 2 years ago  . Drug use: No    Allergies:  Allergies  Allergen Reactions  . Phenergan [Promethazine] Shortness Of Breath and Palpitations  . Shellfish Allergy Anaphylaxis    ALL SHELLFISH  . Codeine Hives   . Milk-Related Compounds Itching and Swelling    All dairy products  . Other Hives and Swelling    Oranges,  pineapples  . Oxycodone Other (See Comments)    Percocet-- "insomnia"  . Tramadol Other (See Comments)    Ultram-- " manic episode"  . Hydrocodone Hives and Itching    vicodin    Meds:  Medications Prior to Admission  Medication Sig Dispense Refill Last Dose  . NIFEdipine (PROCARDIA XL/NIFEDICAL XL) 60 MG 24 hr tablet Take 60 mg by mouth daily.     Marland Kitchen albuterol (PROVENTIL HFA;VENTOLIN HFA) 108 (90 Base) MCG/ACT inhaler Inhale 2 puffs into the lungs every 6 (six) hours as needed for wheezing or shortness of breath. 18 g 3   . aspirin EC 81 MG tablet Take 1 tablet (81 mg total) by mouth daily. 30 tablet    . Blood Pressure Monitor KIT 1 kit by Does not apply route once a week. 1 kit 0   . budesonide-formoterol (SYMBICORT) 80-4.5 MCG/ACT inhaler Inhale 2 puffs into the lungs every morning. (Patient taking differently: Inhale 2 puffs into the lungs every morning. ) 1 Inhaler 5   . Calcium 600-400 MG-UNIT CHEW Chew by mouth daily.     . Cholecalciferol (VITAMIN D3) 2000 units TABS Take 1 tablet by mouth daily.     . Coenzyme Q10 (COQ-10) 200 MG CAPS Take 1 capsule by mouth every morning.     Marland Kitchen EPINEPHrine 0.3 mg/0.3 mL IJ SOAJ injection Inject into the muscle once.     . Multiple Vitamin (MULTIVITAMIN WITH MINERALS) TABS tablet Take 1 tablet by mouth daily.     . Prenatal Vit-Fe Fumarate-FA (PRENATAL VITAMINS PO) Take by mouth.     . progesterone (PROMETRIUM) 200 MG capsule Take 200 mg by mouth at bedtime.       ROS:  Review of Systems  Constitutional: Negative for chills, fatigue and fever.  Eyes: Negative for visual disturbance.  Respiratory: Negative for shortness of breath.   Cardiovascular: Negative for chest pain.  Gastrointestinal: Negative for abdominal pain, nausea and vomiting.  Genitourinary: Negative for difficulty urinating, dysuria, flank pain, pelvic pain,  vaginal bleeding, vaginal discharge and vaginal pain.  Neurological: Negative for dizziness and headaches.  Psychiatric/Behavioral: Negative.      I have reviewed patient's Past Medical Hx, Surgical Hx, Family Hx, Social Hx, medications and allergies.   Physical Exam   Patient Vitals for the past 24 hrs:  BP Temp Temp src Pulse Resp SpO2 Height Weight  02/08/19 1231 (!) 146/89 - - 84 - - - -  02/08/19 1216 138/85 - - 86 - - - -  02/08/19 1201 138/87 - - 91 - - - -  02/08/19 1146 (!) 144/69 - - 90 - - - -  02/08/19 1135 (!) 161/82 - - 86 - 100 % - -  02/08/19 1131 (!) 158/106 - - 83 - - - -  02/08/19 1117 (!) 159/93 - - 82 - - - -  02/08/19 1101 (!) 151/93 - - 85 - - - -  02/08/19 1046 (!) 144/88 - - 91 - - - -  02/08/19 1010 (!) 167/97 - - 89 - 99 % - -  02/08/19 1002 (!) 168/100 - - 84 - - - -  02/08/19 0945 (!) 153/92 - - 94 - - - -  02/08/19 0935 (!) 158/93 - - 86 - - - -  02/08/19 0917 (!) 163/97 98.2 F (36.8 C) Oral 87 16 100 % - -  02/08/19 2707 - - - - - - 5' 2"  (1.575 m) 112.1 kg   Constitutional: Well-developed, well-nourished female in no acute distress.  HEART: normal rate, heart sounds, regular rhythm RESP: normal effort, lung sounds clear and equal bilaterally GI: Abd soft, non-tender, gravid appropriate for gestational age.  MS: Extremities nontender, no edema, normal ROM Neurologic: Alert and oriented x 4.  GU: Neg CVAT.  PELVIC EXAM: Deferred     FHT:  Baseline 145, moderate variability, accelerations present, no decelerations Contractions: None on toco or to palpation   Labs: Results for orders placed or performed during the hospital encounter of 02/08/19 (from the past 24 hour(s))  Urinalysis, Routine w reflex microscopic     Status: Abnormal   Collection Time: 02/08/19  9:48 AM  Result Value Ref Range   Color, Urine YELLOW YELLOW   APPearance CLOUDY (A) CLEAR   Specific Gravity, Urine 1.019 1.005 - 1.030   pH 5.0 5.0 - 8.0   Glucose, UA  NEGATIVE NEGATIVE mg/dL   Hgb urine dipstick NEGATIVE NEGATIVE   Bilirubin Urine NEGATIVE NEGATIVE   Ketones, ur 5 (A) NEGATIVE mg/dL   Protein, ur 100 (A) NEGATIVE mg/dL   Nitrite NEGATIVE NEGATIVE   Leukocytes,Ua NEGATIVE NEGATIVE   RBC / HPF 0-5 0 - 5 RBC/hpf   WBC, UA 6-10 0 - 5 WBC/hpf   Bacteria, UA RARE (A) NONE SEEN   Squamous Epithelial / LPF 11-20 0 - 5   Mucus PRESENT   Protein / creatinine ratio, urine     Status: Abnormal   Collection Time: 02/08/19  9:48 AM  Result Value Ref Range   Creatinine, Urine 209.81 mg/dL   Total Protein, Urine 145 mg/dL   Protein Creatinine Ratio 0.69 (H) 0.00 - 0.15 mg/mg[Cre]  CBC     Status: Abnormal   Collection Time: 02/08/19 10:00 AM  Result Value Ref Range   WBC 5.3 4.0 - 10.5 K/uL   RBC 3.63 (L) 3.87 - 5.11 MIL/uL   Hemoglobin 11.0 (L) 12.0 - 15.0 g/dL   HCT 32.9 (L) 36.0 - 46.0 %   MCV 90.6 80.0 - 100.0 fL   MCH 30.3 26.0 - 34.0 pg   MCHC 33.4 30.0 - 36.0 g/dL   RDW 13.2 11.5 - 15.5 %   Platelets 272 150 - 400 K/uL   nRBC 0.0 0.0 - 0.2 %  Comprehensive metabolic panel     Status: Abnormal   Collection Time: 02/08/19 10:00 AM  Result Value Ref Range   Sodium 135 135 - 145 mmol/L   Potassium 3.4 (L) 3.5 - 5.1 mmol/L   Chloride 106 98 - 111 mmol/L   CO2 22 22 - 32 mmol/L   Glucose, Bld 99 70 - 99 mg/dL   BUN 6 6 - 20 mg/dL   Creatinine, Ser 0.70 0.44 - 1.00 mg/dL   Calcium 9.2 8.9 - 10.3 mg/dL   Total  Protein 6.1 (L) 6.5 - 8.1 g/dL   Albumin 2.7 (L) 3.5 - 5.0 g/dL   AST 44 (H) 15 - 41 U/L   ALT 36 0 - 44 U/L   Alkaline Phosphatase 81 38 - 126 U/L   Total Bilirubin 0.4 0.3 - 1.2 mg/dL   GFR calc non Af Amer >60 >60 mL/min   GFR calc Af Amer >60 >60 mL/min   Anion gap 7 5 - 15   O/Positive/-- (07/09 1504)  Imaging:  No results found.  MAU Course/MDM: Orders Placed This Encounter  Procedures  . SARS CORONAVIRUS 2 (TAT 6-24 HRS) Nasopharyngeal Nasopharyngeal Swab  . Urinalysis, Routine w reflex microscopic  . CBC   . Comprehensive metabolic panel  . Protein / creatinine ratio, urine  . Diet regular Room service appropriate? Yes; Fluid consistency: Thin  . Notify Physician  . Measure blood pressure  . Notify physician (specify)  . Vital signs  . Defer vaginal exam for vaginal bleeding or PROM <37 weeks  . Initiate Oral Care Protocol  . Initiate Carrier Fluid Protocol  . Fetal monitoring  . Tocometry while awake  . Bed rest with bathroom privileges  . Patient may shower  . Full code  . Type and screen [place]  . Admit to Inpatient (patient's expected length of stay will be greater than 2 midnights or inpatient only procedure)    Meds ordered this encounter  Medications  . AND Linked Order Group   . labetalol (NORMODYNE) injection 20 mg   . labetalol (NORMODYNE) injection 40 mg   . labetalol (NORMODYNE) injection 80 mg   . hydrALAZINE (APRESOLINE) injection 10 mg  . aspirin EC tablet 81 mg  . NIFEdipine (PROCARDIA-XL/NIFEDICAL-XL) 24 hr tablet 60 mg  . DISCONTD: Prenatal Vitamins 28-0.8 MG TABS  . acetaminophen (TYLENOL) tablet 650 mg  . zolpidem (AMBIEN) tablet 5 mg  . docusate sodium (COLACE) capsule 100 mg  . calcium carbonate (TUMS - dosed in mg elemental calcium) chewable tablet 400 mg of elemental calcium  . prenatal multivitamin tablet 1 tablet  . sodium chloride flush (NS) 0.9 % injection 3 mL  . sodium chloride flush (NS) 0.9 % injection 3 mL  . 0.9 %  sodium chloride infusion  . betamethasone acetate-betamethasone sodium phosphate (CELESTONE) injection 12 mg  . magnesium bolus via infusion 4 g  . magnesium sulfate 40 grams in SWI 1000 mL OB infusion     NST reviewed and reactive Pt elevated P/C ratio and AST c/w superimposed PEC. Pt without symptoms.  Consult [place] with presentation, exam findings and test results.  Admit for BP management, repeat labs in 6 hours. Called Dr Sandford Craze to recommend admission.  Start Mag sulfate 4g bolus/2g per  hour, give BMZ x 2 in 24 hours. PEC protocol IV orders still in place on admission, pt BP stable 140s/80s at time of transfer to antepartum  Assessment: 1. Chronic hypertension with superimposed preeclampsia     Plan: Admit to antepartum BMZ, Mag sulfate 4 g bolus then 2 g/hour Repeat PEC labs in 6 hours PEC Protocol with labetalol, then hydralazine PRN for severe range BP   Fatima Blank Certified Nurse-Midwife 02/08/2019 4:05 PM

## 2019-02-08 NOTE — MAU Note (Signed)
Victoria George is a 43 y.o. at 15w2dhere in MAU reporting: took BP this morning and it was 192/114 on her arm cuff and then 155/104 on wrist cuff. Is on nifedipine er 60 mg daily. Took it last night. No headache, visual changes, or epigastric pain. No contractions, bleeding, or LOF. +FM  Onset of complaint: today  Pain score: 0/10  Vitals:   02/08/19 0917  BP: (!) 163/97  Pulse: 87  Resp: 16  Temp: 98.2 F (36.8 C)  SpO2: 100%     FHT: +FM  Lab orders placed from triage: UA

## 2019-02-09 LAB — COMPREHENSIVE METABOLIC PANEL
ALT: 32 U/L (ref 0–44)
AST: 39 U/L (ref 15–41)
Albumin: 2.7 g/dL — ABNORMAL LOW (ref 3.5–5.0)
Alkaline Phosphatase: 86 U/L (ref 38–126)
Anion gap: 12 (ref 5–15)
BUN: 6 mg/dL (ref 6–20)
CO2: 19 mmol/L — ABNORMAL LOW (ref 22–32)
Calcium: 8.6 mg/dL — ABNORMAL LOW (ref 8.9–10.3)
Chloride: 104 mmol/L (ref 98–111)
Creatinine, Ser: 0.56 mg/dL (ref 0.44–1.00)
GFR calc Af Amer: >60 mL/min (ref >60)
GFR calc non Af Amer: >60 mL/min (ref >60)
Glucose, Bld: 134 mg/dL — ABNORMAL HIGH (ref 70–99)
Potassium: 3.6 mmol/L (ref 3.5–5.1)
Sodium: 135 mmol/L (ref 135–145)
Total Bilirubin: 0.5 mg/dL (ref 0.3–1.2)
Total Protein: 6.4 g/dL — ABNORMAL LOW (ref 6.5–8.1)

## 2019-02-09 LAB — CBC
HCT: 32.9 % — ABNORMAL LOW (ref 36.0–46.0)
Hemoglobin: 11.1 g/dL — ABNORMAL LOW (ref 12.0–15.0)
MCH: 30.7 pg (ref 26.0–34.0)
MCHC: 33.7 g/dL (ref 30.0–36.0)
MCV: 91.1 fL (ref 80.0–100.0)
Platelets: 298 10*3/uL (ref 150–400)
RBC: 3.61 MIL/uL — ABNORMAL LOW (ref 3.87–5.11)
RDW: 13.2 % (ref 11.5–15.5)
WBC: 6.9 10*3/uL (ref 4.0–10.5)
nRBC: 0 % (ref 0.0–0.2)

## 2019-02-09 NOTE — Progress Notes (Signed)
Patient ID: Victoria George, female   DOB: 1975-07-16, 43 y.o.   MRN: [id number]   CHTN, SIPE 29+  +FM, no LOF, no VB, no ctx; no PIH sx's, really wants to go home, again d/w pt importance of BP control, now w diagnosis of SIPE.    AFVSS gen NAD FHTs 130's, mod var, appropriate for gestation toco irritability  Plts improved to nl LFTs Normal platelets  On Mg for CP prophylaxis, PreE to get BMZ on board - Has had BP treated, adjusting antihypertensive meds.    D/w pharmacy BP control, if needs more after Mg off, add second agent labetalol 200 bid  D/c Mg at 2nd BMZ shot 4:30 today

## 2019-02-10 ENCOUNTER — Inpatient Hospital Stay (HOSPITAL_COMMUNITY): Payer: PRIVATE HEALTH INSURANCE

## 2019-02-10 DIAGNOSIS — O99213 Obesity complicating pregnancy, third trimester: Secondary | ICD-10-CM

## 2019-02-10 DIAGNOSIS — O3413 Maternal care for benign tumor of corpus uteri, third trimester: Secondary | ICD-10-CM

## 2019-02-10 DIAGNOSIS — O10913 Unspecified pre-existing hypertension complicating pregnancy, third trimester: Secondary | ICD-10-CM

## 2019-02-10 DIAGNOSIS — D259 Leiomyoma of uterus, unspecified: Secondary | ICD-10-CM

## 2019-02-10 DIAGNOSIS — O113 Pre-existing hypertension with pre-eclampsia, third trimester: Secondary | ICD-10-CM

## 2019-02-10 DIAGNOSIS — Z3A29 29 weeks gestation of pregnancy: Secondary | ICD-10-CM

## 2019-02-10 LAB — CBC
HCT: 31.7 % — ABNORMAL LOW (ref 36.0–46.0)
Hemoglobin: 10.6 g/dL — ABNORMAL LOW (ref 12.0–15.0)
MCH: 30.9 pg (ref 26.0–34.0)
MCHC: 33.4 g/dL (ref 30.0–36.0)
MCV: 92.4 fL (ref 80.0–100.0)
Platelets: 286 10*3/uL (ref 150–400)
RBC: 3.43 MIL/uL — ABNORMAL LOW (ref 3.87–5.11)
RDW: 13.7 % (ref 11.5–15.5)
WBC: 10.2 10*3/uL (ref 4.0–10.5)
nRBC: 0.2 % (ref 0.0–0.2)

## 2019-02-10 LAB — HEPATIC FUNCTION PANEL
ALT: 31 U/L (ref 0–44)
AST: 36 U/L (ref 15–41)
Albumin: 2.9 g/dL — ABNORMAL LOW (ref 3.5–5.0)
Alkaline Phosphatase: 83 U/L (ref 38–126)
Bilirubin, Direct: <0.1 mg/dL (ref 0.0–0.2)
Total Bilirubin: 0.3 mg/dL (ref 0.3–1.2)
Total Protein: 6.7 g/dL (ref 6.5–8.1)

## 2019-02-10 LAB — URIC ACID: Uric Acid, Serum: 4.7 mg/dL (ref 2.5–7.1)

## 2019-02-10 LAB — LACTATE DEHYDROGENASE: LDH: 206 U/L — ABNORMAL HIGH (ref 98–192)

## 2019-02-10 MED ORDER — NIFEDIPINE ER OSMOTIC RELEASE 60 MG PO TB24: 60.0000 mg | ORAL_TABLET | Freq: Two times a day (BID) | ORAL | 1 refills | Status: DC

## 2019-02-10 NOTE — Discharge Instructions (Signed)
Call office with any concerns [phone]  Preeclampsia and Eclampsia Preeclampsia is a serious condition that may develop during pregnancy. This condition causes high blood pressure and increased protein in your urine along with other symptoms, such as headaches and vision changes. These symptoms may develop as the condition gets worse. Preeclampsia may occur at 20 weeks of pregnancy or later. Diagnosing and treating preeclampsia early is very important. If not treated early, it can cause serious problems for you and your baby. One problem it can lead to is eclampsia. Eclampsia is a condition that causes muscle jerking or shaking (convulsions or seizures) and other serious problems for the mother. During pregnancy, delivering your baby may be the best treatment for preeclampsia or eclampsia. For most women, preeclampsia and eclampsia symptoms go away after giving birth. In rare cases, a woman may develop preeclampsia after giving birth (postpartum preeclampsia). This usually occurs within 48 hours after childbirth but may occur up to 6 weeks after giving birth. What are the causes? The cause of preeclampsia is not known. What increases the risk? The following risk factors make you more likely to develop preeclampsia:  Being pregnant for the first time.  Having had preeclampsia during a past pregnancy.  Having a family history of preeclampsia.  Having high blood pressure.  Being pregnant with more than one baby.  Being 6 or older.  Being African-American.  Having kidney disease or diabetes.  Having medical conditions such as lupus or blood diseases.  Being very overweight (obese). What are the signs or symptoms? The most common symptoms are:  Severe headaches.  Vision problems, such as blurred or double vision.  Abdominal pain, especially upper abdominal pain. Other symptoms that may develop as the condition gets worse include:  Sudden weight gain.  Sudden swelling of the  hands, face, legs, and feet.  Severe nausea and vomiting.  Numbness in the face, arms, legs, and feet.  Dizziness.  Urinating less than usual.  Slurred speech.  Convulsions or seizures. How is this diagnosed? There are no screening tests for preeclampsia. Your health care provider will ask you about symptoms and check for signs of preeclampsia during your prenatal visits. You may also have tests that include:  Checking your blood pressure.  Urine tests to check for protein. Your health care provider will check for this at every prenatal visit.  Blood tests.  Monitoring your baby's heart rate.  Ultrasound. How is this treated? You and your health care provider will determine the treatment approach that is best for you. Treatment may include:  Having more frequent prenatal exams to check for signs of preeclampsia, if you have an increased risk for preeclampsia.  Medicine to lower your blood pressure.  Staying in the hospital, if your condition is severe. There, treatment will focus on controlling your blood pressure and the amount of fluids in your body (fluid retention).  Taking medicine (magnesium sulfate) to prevent seizures. This may be given as an injection or through an IV.  Taking a low-dose aspirin during your pregnancy.  Delivering your baby early. You may have your labor started with medicine (induced), or you may have a cesarean delivery. Follow these instructions at home: Eating and drinking   Drink enough fluid to keep your urine pale yellow.  Avoid caffeine. Lifestyle  Do not use any products that contain nicotine or tobacco, such as cigarettes and e-cigarettes. If you need help quitting, ask your health care provider.  Do not use alcohol or drugs.  Avoid stress as much as possible. Rest and get plenty of sleep. General instructions  Take over-the-counter and prescription medicines only as told by your health care provider.  When lying down, lie on  your left side. This keeps pressure off your major blood vessels.  When sitting or lying down, raise (elevate) your feet. Try putting some pillows underneath your lower legs.  Exercise regularly. Ask your health care provider what kinds of exercise are best for you.  Keep all follow-up and prenatal visits as told by your health care provider. This is important. How is this prevented? There is no known way of preventing preeclampsia or eclampsia from developing. However, to lower your risk of complications and detect problems early:  Get regular prenatal care. Your health care provider may be able to diagnose and treat the condition early.  Maintain a healthy weight. Ask your health care provider for help managing weight gain during pregnancy.  Work with your health care provider to manage any long-term (chronic) health conditions you have, such as diabetes or kidney problems.  You may have tests of your blood pressure and kidney function after giving birth.  Your health care provider may have you take low-dose aspirin during your next pregnancy. Contact a health care provider if:  You have symptoms that your health care provider told you may require more treatment or monitoring, such as: ? Headaches. ? Nausea or vomiting. ? Abdominal pain. ? Dizziness. ? Light-headedness. Get help right away if:  You have severe: ? Abdominal pain. ? Headaches that do not get better. ? Dizziness. ? Vision problems. ? Confusion. ? Nausea or vomiting.  You have any of the following: ? A seizure. ? Sudden, rapid weight gain. ? Sudden swelling in your hands, ankles, or face. ? Trouble moving any part of your body. ? Numbness in any part of your body. ? Trouble speaking. ? Abnormal bleeding.  You faint. Summary  Preeclampsia is a serious condition that may develop during pregnancy.  This condition causes high blood pressure and increased protein in your urine along with other symptoms,  such as headaches and vision changes.  Diagnosing and treating preeclampsia early is very important. If not treated early, it can cause serious problems for you and your baby.  Get help right away if you have symptoms that your health care provider told you to watch for. This information is not intended to replace advice given to you by your health care provider. Make sure you discuss any questions you have with your health care provider. Document Released: 03/10/2000 Document Revised: 11/13/2017 Document Reviewed: 10/18/2015 Elsevier Patient Education  2020 [employer].

## 2019-02-10 NOTE — Discharge Summary (Signed)
Physician Discharge Summary  Patient ID: Victoria George MRN: [id number] DOB/AGE: 09/23/1975 43 y.o.  Admit date: 02/08/2019 Discharge date: 02/10/2019  Admission Diagnoses:  Discharge Diagnoses:  Active Problems:   Chronic hypertension with superimposed preeclampsia   Discharged Condition: stable  Hospital Course: Pt admitted for severe exacerbation of hypertension. Pt was treated with iv and oral hypertensive medications, received steroids and MgSO4 for CP and seizure prophylaxis. Her blood pressure improved and pt reports no symptoms today. Pending normal [place] and labs and repeat BP, pt to be discharged to home with close office follow up  Consults: mfm  Significant Diagnostic Studies: labs: improved LFTs and radiology: Ultrasound: MFM - growth  Treatments: IV hydration, steroids: betamethasone x 2 , MGSO4, antihypertensives  Discharge Exam: Blood pressure 140/78, pulse (!) 107, temperature 98.5 F (36.9 C), temperature source Oral, resp. rate 18, height _0  (1.575 m), weight 112 kg, SpO2 100 %. General appearance: alert, cooperative and no distress Extremities: extremities normal, atraumatic, no cyanosis or edema Pulses: 2+ and symmetric ABD - cw ga  Disposition:   Discharge Instructions    Call MD for:  difficulty breathing, headache or visual disturbances   Complete by: As directed    Call MD for:  extreme fatigue   Complete by: As directed    Call MD for:  persistant dizziness or light-headedness   Complete by: As directed    Call MD for:  persistant nausea and vomiting   Complete by: As directed    Call MD for:  redness, tenderness, or signs of infection (pain, swelling, redness, odor or green/yellow discharge around incision site)   Complete by: As directed    Call MD for:  severe uncontrolled pain   Complete by: As directed    Call MD for:  temperature >100.4   Complete by: As directed    Diet - low sodium heart healthy   Complete by: As directed    Discharge instructions   Complete by: As directed    Call with any concerns [phone]   Increase activity slowly   Complete by: As directed    Lifting restrictions   Complete by: As directed    <15lbs     Allergies as of 02/10/2019      Reactions   Phenergan [promethazine] Shortness Of Breath, Palpitations   Shellfish Allergy Anaphylaxis   ALL SHELLFISH   Codeine Hives   Milk-related Compounds Itching, Swelling   All dairy products   Other Hives, Swelling   Oranges,  pineapples   Oxycodone Other (See Comments)   Percocet-- "insomnia"   Tramadol Other (See Comments)   Ultram-- " manic episode"   Hydrocodone Hives, Itching   vicodin      Medication List    TAKE these medications   albuterol 108 (90 Base) MCG/ACT inhaler Commonly known as: VENTOLIN HFA Inhale 2 puffs into the lungs every 6 (six) hours as needed for wheezing or shortness of breath.   aspirin EC 81 MG tablet Take 1 tablet (81 mg total) by mouth daily.   Blood Pressure Monitor Kit 1 kit by Does not apply route once a week.   budesonide-formoterol 80-4.5 MCG/ACT inhaler Commonly known as: SYMBICORT Inhale 2 puffs into the lungs every morning.   Calcium 600-400 MG-UNIT Chew Chew by mouth daily.   CoQ-10 200 MG Caps Take 1 capsule by mouth every morning.   EPINEPHrine 0.3 mg/0.3 mL Soaj injection Commonly known as: EPI-PEN Inject into the muscle once.   multivitamin  with minerals Tabs tablet Take 1 tablet by mouth daily.   NIFEdipine 60 MG 24 hr tablet Commonly known as: PROCARDIA XL/NIFEDICAL XL Take 1 tablet (60 mg total) by mouth 2 (two) times daily. What changed: when to take this   PRENATAL VITAMINS PO Take by mouth.   progesterone 200 MG capsule Commonly known as: PROMETRIUM Take 200 mg by mouth at bedtime.   Vitamin D3 50 MCG (2000 UT) Tabs Take 1 tablet by mouth daily.      Follow-up Information    Meisinger, Sherren Mocha, MD. Schedule an appointment as soon as possible for a  visit.   Specialty: Obstetrics and Gynecology Why: On 02/13/19 or 02/14/19 for visit and to recheck BP Contact information: [address] [place] [address] [phone]           Signed: Isaiah Serge 02/10/2019, 9:18 AM

## 2019-02-10 NOTE — Progress Notes (Signed)
Patient ID: Victoria George, female   DOB: 1975-10-22, 43 y.o.   MRN: [id number] BP remaining stable Pt still asymptomatic Labs wnl [place] completed- pending report  If US wnl, pt ok to be discharged to home with f/u in office on 11/19 ) needs to schedule - for NST and visit PreE precautions reviewed

## 2019-02-10 NOTE — Progress Notes (Signed)
Patient ID: Victoria George, female   DOB: Oct 10, 1975, 43 y.o.   MRN: [id number] HD#3 Pt reports feels well. Denies HA, blurry vision, RUQ pain. She is appreciating FMS and no contractions. Denies abnormal discharge. She would like to be discharge to home today if able.  VS- 140/78 ( 140-150/78-85), 107, 18, 98.5 GEN - NAD EFM - 125, cat 1 TOCO - no contractions SVE - deferred  A/P: 43yo G1P0 at 10 4/7wks with chronic htn admitted for severe exacerbation of BP; preE labs wnl ; ordered labs today         Pt maintained on procardia 60xl BID - got dose 30 mins ago         Pt was due for growth [place] in office today; will obtain while here         If normal [place], labs and repeat BP stable, will consider discharge to home with close office follow up; continue baby aspirin daily

## 2019-02-13 ENCOUNTER — Encounter (HOSPITAL_COMMUNITY): Payer: Self-pay | Admitting: *Deleted

## 2019-02-13 ENCOUNTER — Other Ambulatory Visit: Payer: Self-pay

## 2019-02-13 ENCOUNTER — Inpatient Hospital Stay (HOSPITAL_COMMUNITY)
Admission: AD | Admit: 2019-02-13 | Discharge: 2019-02-17 | DRG: 833 | Disposition: A | Discharge status: Home / Self Care | Payer: PRIVATE HEALTH INSURANCE | Attending: Obstetrics and Gynecology | Admitting: Obstetrics and Gynecology

## 2019-02-13 DIAGNOSIS — O09513 Supervision of elderly primigravida, third trimester: Secondary | ICD-10-CM | POA: Diagnosis not present

## 2019-02-13 DIAGNOSIS — O119 Pre-existing hypertension with pre-eclampsia, unspecified trimester: Secondary | ICD-10-CM | POA: Diagnosis present

## 2019-02-13 DIAGNOSIS — E669 Obesity, unspecified: Secondary | ICD-10-CM | POA: Diagnosis present

## 2019-02-13 DIAGNOSIS — O10013 Pre-existing essential hypertension complicating pregnancy, third trimester: Secondary | ICD-10-CM | POA: Diagnosis present

## 2019-02-13 DIAGNOSIS — Z3A30 30 weeks gestation of pregnancy: Secondary | ICD-10-CM | POA: Diagnosis not present

## 2019-02-13 DIAGNOSIS — O99213 Obesity complicating pregnancy, third trimester: Secondary | ICD-10-CM | POA: Diagnosis present

## 2019-02-13 DIAGNOSIS — O113 Pre-existing hypertension with pre-eclampsia, third trimester: Principal | ICD-10-CM | POA: Diagnosis present

## 2019-02-13 LAB — COMPREHENSIVE METABOLIC PANEL
ALT: 46 U/L — ABNORMAL HIGH (ref 0–44)
AST: 37 U/L (ref 15–41)
Albumin: 2.8 g/dL — ABNORMAL LOW (ref 3.5–5.0)
Alkaline Phosphatase: 91 U/L (ref 38–126)
Anion gap: 9 (ref 5–15)
BUN: 7 mg/dL (ref 6–20)
CO2: 24 mmol/L (ref 22–32)
Calcium: 9.2 mg/dL (ref 8.9–10.3)
Chloride: 105 mmol/L (ref 98–111)
Creatinine, Ser: 0.73 mg/dL (ref 0.44–1.00)
GFR calc Af Amer: >60 mL/min (ref >60)
GFR calc non Af Amer: >60 mL/min (ref >60)
Glucose, Bld: 88 mg/dL (ref 70–99)
Potassium: 3.6 mmol/L (ref 3.5–5.1)
Sodium: 138 mmol/L (ref 135–145)
Total Bilirubin: 0.6 mg/dL (ref 0.3–1.2)
Total Protein: 6 g/dL — ABNORMAL LOW (ref 6.5–8.1)

## 2019-02-13 LAB — CBC
HCT: 33.5 % — ABNORMAL LOW (ref 36.0–46.0)
Hemoglobin: 11.2 g/dL — ABNORMAL LOW (ref 12.0–15.0)
MCH: 30.4 pg (ref 26.0–34.0)
MCHC: 33.4 g/dL (ref 30.0–36.0)
MCV: 90.8 fL (ref 80.0–100.0)
Platelets: 289 10*3/uL (ref 150–400)
RBC: 3.69 MIL/uL — ABNORMAL LOW (ref 3.87–5.11)
RDW: 13.2 % (ref 11.5–15.5)
WBC: 7.9 10*3/uL (ref 4.0–10.5)
nRBC: 0 % (ref 0.0–0.2)

## 2019-02-13 LAB — TYPE AND SCREEN
ABO/RH(D): O POS
Antibody Screen: NEGATIVE

## 2019-02-13 MED ORDER — ACETAMINOPHEN 325 MG PO TABS: 650.0000 mg | ORAL_TABLET | ORAL | Status: DC | PRN

## 2019-02-13 MED ORDER — ZOLPIDEM TARTRATE 5 MG PO TABS: 5.0000 mg | ORAL_TABLET | Freq: Every evening | ORAL | Status: DC | PRN

## 2019-02-13 MED ORDER — SODIUM CHLORIDE 0.9 % IV SOLN: 250.0000 mL | INTRAVENOUS | Status: DC | PRN

## 2019-02-13 MED ORDER — PRENATAL MULTIVITAMIN CH
1.0000 | ORAL_TABLET | Freq: Every day | ORAL | Status: DC
  Administered 2019-02-13 – 2019-02-17 (×5): 1 via ORAL
  Filled 2019-02-13 (×6): qty 1

## 2019-02-13 MED ORDER — NIFEDIPINE ER OSMOTIC RELEASE 30 MG PO TB24
60.0000 mg | ORAL_TABLET | Freq: Two times a day (BID) | ORAL | Status: DC
  Administered 2019-02-13 – 2019-02-17 (×8): 60 mg via ORAL
  Filled 2019-02-13 (×8): qty 2

## 2019-02-13 MED ORDER — LABETALOL HCL 5 MG/ML IV SOLN: 40.0000 mg | INTRAVENOUS | Status: DC | PRN

## 2019-02-13 MED ORDER — SODIUM CHLORIDE 0.9% FLUSH
3.0000 mL | Freq: Two times a day (BID) | INTRAVENOUS | Status: DC
  Administered 2019-02-15 – 2019-02-17 (×5): 3 mL via INTRAVENOUS

## 2019-02-13 MED ORDER — DOCUSATE SODIUM 100 MG PO CAPS
100.0000 mg | ORAL_CAPSULE | Freq: Every day | ORAL | Status: DC
  Filled 2019-02-13 (×3): qty 1

## 2019-02-13 MED ORDER — LABETALOL HCL 5 MG/ML IV SOLN: 80.0000 mg | INTRAVENOUS | Status: DC | PRN

## 2019-02-13 MED ORDER — LABETALOL HCL 200 MG PO TABS
200.0000 mg | ORAL_TABLET | Freq: Two times a day (BID) | ORAL | Status: DC
  Administered 2019-02-13 – 2019-02-17 (×9): 200 mg via ORAL
  Filled 2019-02-13 (×9): qty 1

## 2019-02-13 MED ORDER — HYDRALAZINE HCL 20 MG/ML IJ SOLN: 10.0000 mg | INTRAMUSCULAR | Status: DC | PRN

## 2019-02-13 MED ORDER — SODIUM CHLORIDE 0.9% FLUSH: 3.0000 mL | INTRAVENOUS | Status: DC | PRN

## 2019-02-13 MED ORDER — LABETALOL HCL 5 MG/ML IV SOLN
20.0000 mg | INTRAVENOUS | Status: DC | PRN
  Filled 2019-02-13: qty 4

## 2019-02-13 MED ORDER — CALCIUM CARBONATE ANTACID 500 MG PO CHEW: 2.0000 | CHEWABLE_TABLET | ORAL | Status: DC | PRN

## 2019-02-13 NOTE — H&P (Signed)
Victoria George is a 43 y.o. female, G1P0, EGA [redacted]W[redacted]D with [facility] 1-28 presenting for [facility] with superimposed preeclampsia.  She was hospitalized this past weekend for HTN, sent home 3 days ago on procardia XL 60 mg bid.  Seen in the office today, feels fine, but BP 170-190/100-110, NST ok for 30 weeks.  Due to recurrent significant hypertension on max dose procardia, decision made to re-admit.  Pregnancy complicated by IVF, previous myomectomy with planned c-section for 37 weeks.  OB History    Gravida  1   Para      Term      Preterm      AB      Living        SAB      TAB      Ectopic      Multiple      Live Births             Past Medical History:  Diagnosis Date  . Anemia 03/02/2011  . History of gastritis    12/ 2012  . History of ovarian cyst   . HTN (hypertension) 03/02/2011  . Hypertension   . Iron deficiency anemia   . Moderate persistent asthma, uncomplicated    [facility]-- dr Nelva Bush  . Seasonal allergic conjunctivitis   . Seasonal and perennial allergic rhinitis   . Submucous leiomyoma of uterus   . Wears glasses    Past Surgical History:  Procedure Laterality Date  . CARDIOVASCULAR STRESS TEST    . CHOLECYSTECTOMY  02/01/2011   Procedure: LAPAROSCOPIC CHOLECYSTECTOMY WITH INTRAOPERATIVE CHOLANGIOGRAM;  Surgeon: Odis Hollingshead, MD;  Location: [facility];  Service: General;  Laterality: N/A;  Needs IOC and C-arm  . DIAGNOSTIC LAPAROSCOPY  07/2007   ?Lysis Adhesions for obstructive right fallopian tube  . ESOPHAGOGASTRODUODENOSCOPY  01/30/2011   Procedure: ESOPHAGOGASTRODUODENOSCOPY (EGD);  Surgeon: Zenovia Jarred, MD;  Location: [facility];  Service: Gastroenterology;  Laterality: N/A;  . ESOPHAGOGASTRODUODENOSCOPY  03/03/2011   Procedure: ESOPHAGOGASTRODUODENOSCOPY (EGD);  Surgeon: Zenovia Jarred, MD;  Location: [facility];  Service: Gastroenterology;  Laterality: N/A;  . HYSTEROSCOPY N/A 03/22/2016   Procedure: HYSTEROSCOPY, POLYPECTOMY;   Surgeon: Governor Specking, MD;  Location: [facility];  Service: Gynecology;  Laterality: N/A;  . HYSTEROSCOPY WITH RESECTOSCOPE N/A 09/19/2017   Procedure: HYSTEROSCOPY MYOMECTOMY;  Surgeon: Governor Specking, MD;  Location: [facility];  Service: Gynecology;  Laterality: N/A;  . LAPAROSCOPIC GELPORT ASSISTED MYOMECTOMY N/A 03/22/2016   Procedure: LAPAROSCOPIC GELPORT ASSISTED MYOMECTOMY, LYSIS OF ADHESIONS, CHROMOPERTUBATION;  Surgeon: Governor Specking, MD;  Location: [facility];  Service: Gynecology;  Laterality: N/A;  . TRANSTHORACIC ECHOCARDIOGRAM     Family History: family history includes Colon cancer in her maternal grandmother; Diabetes in her brother, maternal grandmother, and paternal grandmother; Diverticulosis in her paternal grandmother; Hypertension in her mother; Kidney disease in her maternal grandmother. Social History:  reports that she has never smoked. She has never used smokeless tobacco. She reports previous alcohol use. She reports that she does not use drugs.     Maternal Diabetes: No Genetic Screening: Normal Maternal Ultrasounds/Referrals: Normal Fetal Ultrasounds or other Referrals:  None Maternal Substance Abuse:  No Significant Maternal Medications:  None Significant Maternal Lab Results:  None Other Comments:  IVF pregnancy  Review of Systems  Respiratory: Negative.   Cardiovascular: Negative.    Maternal Medical History:  Fetal activity: Perceived fetal activity is normal.    Prenatal complications: PIH.  Prenatal Complications - Diabetes: none.      Blood pressure (!) 159/99, pulse 96, temperature 98.8 F (37.1 C), temperature source Oral, resp. rate 17, height 5' 2"  (1.575 m), weight 113.4 kg, SpO2 100 %. Maternal Exam:  Abdomen: Patient reports no abdominal tenderness. Surgical scars: low transverse.   Introitus: Normal vulva. Normal vagina.    Physical Exam  Vitals reviewed. Constitutional: She  appears well-developed and well-nourished.  Cardiovascular: Normal rate and regular rhythm.  Respiratory: Effort normal. No respiratory distress.  GI: Soft.  Genitourinary:    Vulva normal.     Prenatal labs: ABO, Rh: --/--/O POS (11/19 1348) Antibody: NEG (11/19 1348) Rubella: 8.36 (07/09 1504) RPR: Non Reactive (07/09 1504)  HBsAg: Negative (07/09 1504)  HIV: Non Reactive (07/09 1504)  GBS:   none  Assessment/Plan: IUP at [redacted]W[redacted]D with probable CHTN with superimposed preeclampsia.  Feels fine, but severe range BP in the office today on Procardia XL 60 mg bid.  She received betamethasone this past weekend, normal growth on u/s.  Will readmit for BP control, continue Procardia, use IV Labetalol prn and might need to add PO Labetalol.  Labs are stable except Cre has increased from 0.56 to 0.73.  Will monitor closely  Blane Ohara Mitsuye Schrodt 02/13/2019, 4:35 PM

## 2019-02-14 NOTE — Progress Notes (Signed)
Pt refusing IV. Pt educated on potential emergent needs for IV. Spoke with Dr. Willis Modena at 2127 about VS and pt refusal of IV. Verbal orders received and also spoke with pt again on the importance of an IV. Pt states still wanting to wait on IV.

## 2019-02-14 NOTE — Progress Notes (Signed)
Pt doing well, denies PreE symptoms, tolerating PO without issue.  BP range 138-153/74-93. Repeat CBC, CMP tomorrow AM

## 2019-02-14 NOTE — Progress Notes (Signed)
S: Patient feeling well this AM, denying PreE symptoms. Tolerating PO without issue, no issues with ambulating, voiding/stooling. Discussed need for IV in case of emergent medications or rapid hydration. Patient is amenable to this.   O:BP (!) 148/93 (BP Location: Left Arm)   Pulse (!) 102   Temp 98.4 F (36.9 C) (Oral)   Resp 18   Ht 5' 2"  (1.575 m)   Wt 113.4 kg   LMP  (Approximate) Comment: has been on IVF, embryo transfer date 5/15, LMP 4/25  SpO2 99%   BMI 45.73 kg/m  General. Pleasant AAF in NAD, eating breakfast CV: RR but tachy, CTAB (limited by habitus) Abd: Gravid, c/w EGA MSK: neg for edema/homans Psych: WNL  FHT WNL AST/ALT 31/46, Cr 0.73  A/P: This is a 43yo G1P0 @ 30 1/7 admitted now with SI-PreE without SF (incr BP meds, incr uPC form prior admission). PNC c/b AMA, obesity and IVF pregnancy w/ h/o myomectomy necessitating PLTCS this pregnancy  1) SI-PreE -Asx this AM, BP 146-151/73-93 -Nif 60BID, Lab 236m BID added today - continue to monitor Bps -Repeat CBC/CMP tomorrow -Keep T&S active  2) IUP @ 30 1/7 -S/p BMTZ on 11/14-15, received MgSO4 for CP ppx during 11/14-16 admission -PNV -NST q shift -GS done on 11/16: breech, post placenta, AFI WNL    *rpt GS @ 32wks  If BP symptoms present, pt requires multiple IV meds, will consider as severe features and mvoe towards primary section. Patient is aware R/B/A of cesarean section discussed with patient. Alternative would be vaginal delivery which would mean shorter postpartum stay and decreased risk of bleeding. Risks of section include infection of the uterus, pelvic organs, or skin, inadvertent injury to internal organs, such as bowel or bladder. If there is major injury, extensive surgery may be required. If injury is minor, it may be treated with relative ease. Discussed possibility of excessive blood loss and transfusion. If bleeding cannot be controlled using medical or minor surgical methods, a cesarean  hysterectomy may be performed which would mean no future fertility. Patient accepts the possibility of blood transfusion, if necessary. Patient understands and agrees to move forward with section if needed

## 2019-02-15 LAB — COMPREHENSIVE METABOLIC PANEL
ALT: 30 U/L (ref 0–44)
AST: 25 U/L (ref 15–41)
Albumin: 2.5 g/dL — ABNORMAL LOW (ref 3.5–5.0)
Alkaline Phosphatase: 91 U/L (ref 38–126)
Anion gap: 9 (ref 5–15)
BUN: 11 mg/dL (ref 6–20)
CO2: 20 mmol/L — ABNORMAL LOW (ref 22–32)
Calcium: 9 mg/dL (ref 8.9–10.3)
Chloride: 106 mmol/L (ref 98–111)
Creatinine, Ser: 0.7 mg/dL (ref 0.44–1.00)
GFR calc Af Amer: >60 mL/min (ref >60)
GFR calc non Af Amer: >60 mL/min (ref >60)
Glucose, Bld: 95 mg/dL (ref 70–99)
Potassium: 3.3 mmol/L — ABNORMAL LOW (ref 3.5–5.1)
Sodium: 135 mmol/L (ref 135–145)
Total Bilirubin: 0.4 mg/dL (ref 0.3–1.2)
Total Protein: 5.7 g/dL — ABNORMAL LOW (ref 6.5–8.1)

## 2019-02-15 LAB — CBC
HCT: 30.5 % — ABNORMAL LOW (ref 36.0–46.0)
Hemoglobin: 10.3 g/dL — ABNORMAL LOW (ref 12.0–15.0)
MCH: 30.9 pg (ref 26.0–34.0)
MCHC: 33.8 g/dL (ref 30.0–36.0)
MCV: 91.6 fL (ref 80.0–100.0)
Platelets: 275 10*3/uL (ref 150–400)
RBC: 3.33 MIL/uL — ABNORMAL LOW (ref 3.87–5.11)
RDW: 13.6 % (ref 11.5–15.5)
WBC: 6.2 10*3/uL (ref 4.0–10.5)
nRBC: 0 % (ref 0.0–0.2)

## 2019-02-15 NOTE — Progress Notes (Addendum)
S: Patient feeling well this AM, denying PreE symptoms. Tolerating PO without issue, no issues with ambulating, voiding/stooling. Walked to [employer] and back this morning, felt good to stretch  O:BP (!) 149/83   Pulse 98   Temp 98.2 F (36.8 C) (Oral)   Resp 17   Ht 5' 2"  (1.575 m)   Wt 113.4 kg   LMP  (Approximate) Comment: has been on IVF, embryo transfer date 5/15, LMP 4/25  SpO2 100%   BMI 45.73 kg/m  General. Pleasant AAF in NAD CV: RRR, CTAB (limited by habitus) Abd: Gravid, c/w EGA MSK: neg for edema/homans Psych: WNL  FHT WNL AST/ALT 31/46>25/30, Cr 0.73>0.7 (11/20>21)  A/P: This is a 43yo G1P0 @ 30 2/7 admitted HD#2 now with SI-PreE without SF (incr BP meds, incr uPC form prior admission). PNC c/b AMA, obesity and IVF pregnancy w/ h/o myomectomy necessitating PLTCS this pregnancy  1) SI-PreE -Asx this AM, BP 140-148/75-81 -Nif 60BID, Lab 242m BID added today - continue to monitor Bps -Repeat CBC/CMP stable from admission -Keep T&S active>due tomorrow  2) IUP @ 30 2/7 -S/p BMTZ on 11/14-15, received MgSO4 for CP ppx during 11/14-16 admission -PNV -NST q shift -GS done on 11/16: breech, post placenta, AFI WNL    *rpt GS @ 32wks  Possibility of remaining in-house until delivery vs outpatient management. However, still planing on watching patient throughout weekend as medication was added Thursday evening.

## 2019-02-15 NOTE — Progress Notes (Signed)
Bps this afternoon/evening 140s/80-90s. No reports of complaints. Continue to monitor. AM T&S

## 2019-02-16 LAB — TYPE AND SCREEN
ABO/RH(D): O POS
Antibody Screen: NEGATIVE

## 2019-02-16 NOTE — Progress Notes (Signed)
S: Patient feeling well this AM, denying PreE symptoms. Tolerating PO without issue, no issues with ambulating, voiding/stooling. Walked to [employer] and back this morning, felt good to stretch. Does have sweeling in feet but states this reduces when elevated or am,bulatory. Is no currently wearing her SCDs  O:BP (!) 147/81   Pulse 96   Temp 98 F (36.7 C) (Oral)   Resp 16   Ht 5' 2"  (1.575 m)   Wt 113.4 kg   LMP  (Approximate) Comment: has been on IVF, embryo transfer date 5/15, LMP 4/25  SpO2 99%   BMI 45.73 kg/m  General. Pleasant AAF in NAD CV: RRR, CTAB (limited by habitus) Abd: Gravid, c/w EGA MSK: neg for homans. 1-2+ pedal edema, NTTP. No palpable cords in calves, SCDs not one Psych: WNL  FHT WNL AST/ALT 31/46>25/30, Cr 0.73>0.7 (11/20>21)  A/P: This is a 43yo G1P0 @ 30 3/7 admitted HD#3 now with SI-PreE without SF (incr BP meds, incr uPC form prior admission). PNC c/b AMA, obesity and IVF pregnancy w/ h/o myomectomy necessitating PLTCS this pregnancy  1) SI-PreE -Asx this AM, BP 133-151/65-81 -Nif 60BID, Lab 228m BID added this admission    *BP stable at this time -Repeat CBC/CMP stable from admission -Keep T&S active>completed this AM  2) IUP @ 30 3/7 -S/p BMTZ on 11/14-15, received MgSO4 for CP ppx during 11/14-16 admission -PNV -NST q shift: reactive thus far -GS done on 11/16: breech, post placenta, AFI WNL    *rpt GS @ 32wks  Possibility of remaining in-house until delivery vs outpatient management. However, still planing on watching patient throughout weekend as medication was added Thursday evening.

## 2019-02-17 LAB — CBC
HCT: 31.5 % — ABNORMAL LOW (ref 36.0–46.0)
Hemoglobin: 10.4 g/dL — ABNORMAL LOW (ref 12.0–15.0)
MCH: 30.5 pg (ref 26.0–34.0)
MCHC: 33 g/dL (ref 30.0–36.0)
MCV: 92.4 fL (ref 80.0–100.0)
Platelets: 265 10*3/uL (ref 150–400)
RBC: 3.41 MIL/uL — ABNORMAL LOW (ref 3.87–5.11)
RDW: 13.9 % (ref 11.5–15.5)
WBC: 5.9 10*3/uL (ref 4.0–10.5)
nRBC: 0 % (ref 0.0–0.2)

## 2019-02-17 LAB — HEPATIC FUNCTION PANEL
ALT: 27 U/L (ref 0–44)
AST: 25 U/L (ref 15–41)
Albumin: 2.6 g/dL — ABNORMAL LOW (ref 3.5–5.0)
Alkaline Phosphatase: 93 U/L (ref 38–126)
Bilirubin, Direct: <0.1 mg/dL (ref 0.0–0.2)
Total Bilirubin: 0.4 mg/dL (ref 0.3–1.2)
Total Protein: 6 g/dL — ABNORMAL LOW (ref 6.5–8.1)

## 2019-02-17 LAB — URIC ACID: Uric Acid, Serum: 5.5 mg/dL (ref 2.5–7.1)

## 2019-02-17 LAB — PROTEIN / CREATININE RATIO, URINE
Creatinine, Urine: 132.64 mg/dL
Protein Creatinine Ratio: 0.84 mg/mg{Cre} — ABNORMAL HIGH (ref 0.00–0.15)
Total Protein, Urine: 111 mg/dL

## 2019-02-17 LAB — LACTATE DEHYDROGENASE: LDH: 178 U/L (ref 98–192)

## 2019-02-17 NOTE — Progress Notes (Signed)
Discharge teaching complete with pt. Medications discussed. Signs and symptoms of preeclampsia discussed.

## 2019-02-17 NOTE — Discharge Instructions (Signed)
Call office [phone] with any concerns

## 2019-02-17 NOTE — Progress Notes (Addendum)
Patient ID: Victoria George, female   DOB: 04-13-1975, 43 y.o.   MRN: [id number]  Pt doing well. She denies HA, blurry vision or Pain. +Fms. No contractions or abnormal discharge. She would love to be able to go home but understands concerns given recent readmission. Has burning at IV site. VS - 129-143/68-83, 87 GEN - NAD ABD - cw gestational age EFM - 140, cat 1 TOCO - no contractions SVE - deferred  A/P: 43yo prime at 17 4/7ks with CHTN with superimposed preeclampsia per BP; pt has been asymptomatic and labs have shown increased proteinuria but otherwise wnl.      - Discussed with pt given asymptomatic even with BPs as high as 190/110s, on max dose of procardia ( 60xl bid) and labetalol 278m po bid,  office being closed over the holidays hence unable to monitor her as closely outpt, I have concerns about discharging her to home at this time without further workup       - I will recheck preE labs today and consider speaking to MFM as well         - Continue on q shift nsts         - S/P BMZ this past weekend        - Given hx of myomectomy plan is for c/s for delivery no later than [redacted] weeks gestation

## 2019-02-17 NOTE — Progress Notes (Signed)
Patient ID: Victoria George, female   DOB: [birth year], 43 y.o.   MRN: [id number] Pt remains asymptomatic - no HA or blurry vision or CP BP stable 140-144/86-94 No contractions  Labs: AST 25 ; ALT 27, LDH - 178,            5.9>10.4<265           Urine/protein creatinine - 0.84  Reviewed patients care with MFM  Agree to discharge pt to home - symptoms more consistent with chronic hypertension ; exacerbation now controlled.  Pt to come to office in two days for BPP and BP check and return in a week. Pt also plans to follow with crisis nurse care to report her BP readings.  Reviewed preeclampsia precautions  Pt happy with plan

## 2019-02-17 NOTE — Discharge Summary (Addendum)
Physician Discharge Summary  Patient ID: Victoria George MRN: [id number] DOB/AGE: 45-02-1976 45 y.o.  Admit date: 02/13/2019 Discharge date: 02/17/2019  Admission Diagnoses:Pregnancy in third trimester                                  Chronic hypertension with exacerbation                                  Advanced maternal age                                  Pregnancy via IVF Discharge Diagnoses: Same   Active Problems:   Chronic hypertension with superimposed preeclampsia   Discharged Condition: stable  Hospital Course: Pt admitted for hypertension in severe range. Her blood pressure was controlled on procardia 60xl bid and labetalol 226m bid over the course of three days. Pt remained asymptomatic the entire time. Her labs were all in normal range. Pt deemed stable for outpatient follow up today ; close outpatient follow up planned  Consults: MFM  Significant Diagnostic Studies: labs: wnl  Treatments: antihypertensive oral medication  Discharge Exam: Blood pressure (!) 140/94, pulse 99, temperature 99.2 F (37.3 C), temperature source Oral, resp. rate 18, height _0  (1.575 m), weight 113.4 kg, SpO2 100 %. General appearance: alert, cooperative and no distress GI: cw gestational age Extremities: Homans sign is negative, no sign of DVT  Disposition: Discharge disposition: 01-Home or Self Care       Discharge Instructions    Call MD for:  difficulty breathing, headache or visual disturbances   Complete by: As directed    Call MD for:  extreme fatigue   Complete by: As directed    Call MD for:  persistant dizziness or light-headedness   Complete by: As directed    Call MD for:  persistant nausea and vomiting   Complete by: As directed    Call MD for:  severe uncontrolled pain   Complete by: As directed    Call MD for:  temperature >100.4   Complete by: As directed    Diet - low sodium heart healthy   Complete by: As directed    Discharge instructions    Complete by: As directed    Call with persistent headaches, blurry vision or uncontrolled pain   Increase activity slowly   Complete by: As directed      Allergies as of 02/17/2019      Reactions   Phenergan [promethazine] Shortness Of Breath, Palpitations   Shellfish Allergy Anaphylaxis   ALL SHELLFISH   Codeine Hives   Milk-related Compounds Itching, Swelling   All dairy products   Other Hives, Swelling   Oranges,  pineapples   Oxycodone Other (See Comments)   Percocet-- "insomnia"   Tramadol Other (See Comments)   Ultram-- " manic episode"   Hydrocodone Hives, Itching   vicodin      Medication List    TAKE these medications   albuterol 108 (90 Base) MCG/ACT inhaler Commonly known as: VENTOLIN HFA Inhale 2 puffs into the lungs every 6 (six) hours as needed for wheezing or shortness of breath.   aspirin EC 81 MG tablet Take 1 tablet (81 mg total) by mouth daily.   Blood Pressure Monitor Kit 1 kit by Does not  apply route once a week.   budesonide-formoterol 80-4.5 MCG/ACT inhaler Commonly known as: SYMBICORT Inhale 2 puffs into the lungs every morning. What changed:   when to take this  reasons to take this   calcium carbonate 500 MG chewable tablet Commonly known as: TUMS - dosed in mg elemental calcium Chew 1 tablet by mouth as needed for indigestion or heartburn.   EPINEPHrine 0.3 mg/0.3 mL Soaj injection Commonly known as: EPI-PEN Inject into the muscle once.   NIFEdipine 60 MG 24 hr tablet Commonly known as: PROCARDIA XL/NIFEDICAL XL Take 1 tablet (60 mg total) by mouth 2 (two) times daily.   PRENATAL VITAMINS PO Take by mouth.     labetalol 251m po bid  Follow-up Information    Meisinger, TSherren Mocha MD. Schedule an appointment as soon as possible for a visit.   Specialty: Obstetrics and Gynecology Why: 02/19/2019 for biophysical profile ( ultrasound) and visit to check blood pressure Contact information: [address] [id number]          Signed: CIsaiah Serge11/23/2020, 6:39 PM

## 2019-02-19 ENCOUNTER — Encounter (HOSPITAL_COMMUNITY): Payer: Self-pay | Admitting: *Deleted

## 2019-02-19 ENCOUNTER — Other Ambulatory Visit: Payer: Self-pay

## 2019-02-19 ENCOUNTER — Inpatient Hospital Stay (HOSPITAL_COMMUNITY)
Admission: AD | Admit: 2019-02-19 | Discharge: 2019-03-05 | DRG: 788 | Disposition: A | Discharge status: Home / Self Care | Payer: PRIVATE HEALTH INSURANCE | Attending: Obstetrics and Gynecology | Admitting: Obstetrics and Gynecology

## 2019-02-19 DIAGNOSIS — Z3A30 30 weeks gestation of pregnancy: Secondary | ICD-10-CM

## 2019-02-19 DIAGNOSIS — D259 Leiomyoma of uterus, unspecified: Secondary | ICD-10-CM

## 2019-02-19 DIAGNOSIS — O9952 Diseases of the respiratory system complicating childbirth: Secondary | ICD-10-CM | POA: Diagnosis present

## 2019-02-19 DIAGNOSIS — J4540 Moderate persistent asthma, uncomplicated: Secondary | ICD-10-CM | POA: Diagnosis present

## 2019-02-19 DIAGNOSIS — O1002 Pre-existing essential hypertension complicating childbirth: Secondary | ICD-10-CM | POA: Diagnosis present

## 2019-02-19 DIAGNOSIS — R062 Wheezing: Secondary | ICD-10-CM

## 2019-02-19 DIAGNOSIS — O321XX0 Maternal care for breech presentation, not applicable or unspecified: Secondary | ICD-10-CM | POA: Diagnosis present

## 2019-02-19 DIAGNOSIS — O3429 Maternal care due to uterine scar from other previous surgery: Secondary | ICD-10-CM | POA: Diagnosis present

## 2019-02-19 DIAGNOSIS — D250 Submucous leiomyoma of uterus: Secondary | ICD-10-CM | POA: Diagnosis present

## 2019-02-19 DIAGNOSIS — O119 Pre-existing hypertension with pre-eclampsia, unspecified trimester: Secondary | ICD-10-CM

## 2019-02-19 DIAGNOSIS — O3413 Maternal care for benign tumor of corpus uteri, third trimester: Secondary | ICD-10-CM | POA: Diagnosis present

## 2019-02-19 DIAGNOSIS — O114 Pre-existing hypertension with pre-eclampsia, complicating childbirth: Principal | ICD-10-CM | POA: Diagnosis present

## 2019-02-19 DIAGNOSIS — O113 Pre-existing hypertension with pre-eclampsia, third trimester: Secondary | ICD-10-CM

## 2019-02-19 DIAGNOSIS — D252 Subserosal leiomyoma of uterus: Secondary | ICD-10-CM | POA: Diagnosis present

## 2019-02-19 DIAGNOSIS — Z98891 History of uterine scar from previous surgery: Secondary | ICD-10-CM

## 2019-02-19 LAB — CBC
HCT: 33.7 % — ABNORMAL LOW (ref 36.0–46.0)
Hemoglobin: 11.3 g/dL — ABNORMAL LOW (ref 12.0–15.0)
MCH: 30.8 pg (ref 26.0–34.0)
MCHC: 33.5 g/dL (ref 30.0–36.0)
MCV: 91.8 fL (ref 80.0–100.0)
Platelets: 269 10*3/uL (ref 150–400)
RBC: 3.67 MIL/uL — ABNORMAL LOW (ref 3.87–5.11)
RDW: 13.6 % (ref 11.5–15.5)
WBC: 5.8 10*3/uL (ref 4.0–10.5)
nRBC: 0 % (ref 0.0–0.2)

## 2019-02-19 LAB — URINALYSIS, ROUTINE W REFLEX MICROSCOPIC
Bilirubin Urine: NEGATIVE
Glucose, UA: NEGATIVE mg/dL
Hgb urine dipstick: NEGATIVE
Ketones, ur: NEGATIVE mg/dL
Leukocytes,Ua: NEGATIVE
Nitrite: NEGATIVE
Protein, ur: 100 mg/dL — AB
Specific Gravity, Urine: 1.006 (ref 1.005–1.030)
pH: 6 (ref 5.0–8.0)

## 2019-02-19 LAB — COMPREHENSIVE METABOLIC PANEL
ALT: 31 U/L (ref 0–44)
AST: 35 U/L (ref 15–41)
Albumin: 2.9 g/dL — ABNORMAL LOW (ref 3.5–5.0)
Alkaline Phosphatase: 107 U/L (ref 38–126)
Anion gap: 8 (ref 5–15)
BUN: 7 mg/dL (ref 6–20)
CO2: 21 mmol/L — ABNORMAL LOW (ref 22–32)
Calcium: 8.9 mg/dL (ref 8.9–10.3)
Chloride: 107 mmol/L (ref 98–111)
Creatinine, Ser: 0.74 mg/dL (ref 0.44–1.00)
GFR calc Af Amer: >60 mL/min (ref >60)
GFR calc non Af Amer: >60 mL/min (ref >60)
Glucose, Bld: 90 mg/dL (ref 70–99)
Potassium: 3.7 mmol/L (ref 3.5–5.1)
Sodium: 136 mmol/L (ref 135–145)
Total Bilirubin: 0.5 mg/dL (ref 0.3–1.2)
Total Protein: 6.3 g/dL — ABNORMAL LOW (ref 6.5–8.1)

## 2019-02-19 LAB — TYPE AND SCREEN
ABO/RH(D): O POS
Antibody Screen: NEGATIVE

## 2019-02-19 LAB — PROTEIN / CREATININE RATIO, URINE
Creatinine, Urine: 52.86 mg/dL
Protein Creatinine Ratio: 1.66 mg/mg{Cre} — ABNORMAL HIGH (ref 0.00–0.15)
Total Protein, Urine: 88 mg/dL

## 2019-02-19 LAB — GROUP B STREP BY PCR: Group B strep by PCR: NEGATIVE

## 2019-02-19 MED ORDER — SODIUM CHLORIDE 0.9% FLUSH
3.0000 mL | Freq: Two times a day (BID) | INTRAVENOUS | Status: DC
  Administered 2019-02-19: 3 mL via INTRAVENOUS

## 2019-02-19 MED ORDER — ASPIRIN EC 81 MG PO TBEC
81.0000 mg | DELAYED_RELEASE_TABLET | Freq: Every day | ORAL | Status: DC
  Administered 2019-02-19 – 2019-03-01 (×11): 81 mg via ORAL
  Filled 2019-02-19 (×12): qty 1

## 2019-02-19 MED ORDER — LABETALOL HCL 5 MG/ML IV SOLN
40.0000 mg | INTRAVENOUS | Status: DC | PRN
  Administered 2019-02-24 – 2019-02-25 (×2): 40 mg via INTRAVENOUS
  Filled 2019-02-19 (×2): qty 8

## 2019-02-19 MED ORDER — HYDRALAZINE HCL 20 MG/ML IJ SOLN: 10.0000 mg | INTRAMUSCULAR | Status: DC | PRN

## 2019-02-19 MED ORDER — ACETAMINOPHEN 325 MG PO TABS: 650.0000 mg | ORAL_TABLET | ORAL | Status: DC | PRN

## 2019-02-19 MED ORDER — LABETALOL HCL 5 MG/ML IV SOLN: 80.0000 mg | INTRAVENOUS | Status: DC | PRN

## 2019-02-19 MED ORDER — CALCIUM CARBONATE ANTACID 500 MG PO CHEW: 2.0000 | CHEWABLE_TABLET | ORAL | Status: DC | PRN

## 2019-02-19 MED ORDER — LABETALOL HCL 200 MG PO TABS
200.0000 mg | ORAL_TABLET | Freq: Two times a day (BID) | ORAL | Status: DC
  Administered 2019-02-19 – 2019-02-24 (×11): 200 mg via ORAL
  Filled 2019-02-19 (×11): qty 1

## 2019-02-19 MED ORDER — SODIUM CHLORIDE 0.9% FLUSH
3.0000 mL | INTRAVENOUS | Status: DC | PRN
  Administered 2019-02-25 – 2019-03-01 (×2): 3 mL via INTRAVENOUS
  Filled 2019-02-19 (×2): qty 3

## 2019-02-19 MED ORDER — PRENATAL MULTIVITAMIN CH
1.0000 | ORAL_TABLET | Freq: Every day | ORAL | Status: DC
  Administered 2019-02-20 – 2019-03-01 (×9): 1 via ORAL
  Filled 2019-02-19 (×10): qty 1

## 2019-02-19 MED ORDER — ZOLPIDEM TARTRATE 5 MG PO TABS: 5.0000 mg | ORAL_TABLET | Freq: Every evening | ORAL | Status: DC | PRN

## 2019-02-19 MED ORDER — LABETALOL HCL 5 MG/ML IV SOLN
20.0000 mg | INTRAVENOUS | Status: DC | PRN
  Administered 2019-02-19 – 2019-02-25 (×4): 20 mg via INTRAVENOUS
  Filled 2019-02-19 (×4): qty 4

## 2019-02-19 MED ORDER — PRENATAL MULTIVITAMIN CH: 1.0000 | ORAL_TABLET | Freq: Every day | ORAL | Status: DC

## 2019-02-19 MED ORDER — LACTATED RINGERS IV SOLN
INTRAVENOUS | Status: DC | PRN
  Administered 2019-02-19: 14:00:00 via INTRAVENOUS

## 2019-02-19 MED ORDER — DOCUSATE SODIUM 100 MG PO CAPS: 100.0000 mg | ORAL_CAPSULE | Freq: Every day | ORAL | Status: DC

## 2019-02-19 MED ORDER — SODIUM CHLORIDE 0.9 % IV SOLN: 250.0000 mL | INTRAVENOUS | Status: DC | PRN

## 2019-02-19 MED ORDER — NIFEDIPINE ER OSMOTIC RELEASE 30 MG PO TB24
60.0000 mg | ORAL_TABLET | Freq: Two times a day (BID) | ORAL | Status: DC
  Administered 2019-02-19 – 2019-03-01 (×21): 60 mg via ORAL
  Filled 2019-02-19 (×22): qty 2

## 2019-02-19 MED ORDER — DOCUSATE SODIUM 100 MG PO CAPS
100.0000 mg | ORAL_CAPSULE | Freq: Every day | ORAL | Status: DC
  Administered 2019-02-20 – 2019-02-27 (×2): 100 mg via ORAL
  Filled 2019-02-19 (×6): qty 1

## 2019-02-19 NOTE — MAU Provider Note (Signed)
Chief Complaint  Patient presents with  . Hypertension     First Provider Initiated Contact with Patient 02/19/19 1401      S: Victoria George  is a 43 y.o. y.o. year old G59P0 female at 72w6dweeks gestation who presents to [facility]U with elevated blood pressures. Hx of chronic hypertension. Current blood pressure medication: procardia 60xl BID & labetalol 200 mg BID. Has not missed doses; last dose of both was this morning.  Has been admitted twice this month for BP management. Was in the office this morning & had 2 severe range BPs (170s/110s).  Associated symptoms: denies Headache, denies vision changes, denies epigastric pain Contractions: denies Vaginal bleeding: denies Fetal movement: good  O:  Patient Vitals for the past 24 hrs:  BP Temp Temp src Pulse Resp SpO2 Height Weight  02/19/19 1448 (!) 152/90 - - 84 - - - -  02/19/19 1430 (!) 151/88 - - 86 - - - -  02/19/19 1416 (!) 154/86 - - 84 - - - -  02/19/19 1400 (!) 163/90 - - 90 - - - -  02/19/19 1350 (!) 153/88 - - 86 - - - -  02/19/19 1317 (!) 164/93 97.9 F (36.6 C) Oral 84 20 100 % 5' 2"  (1.575 m) 254 lb (115.2 kg)   General: NAD Heart: Regular rate Lungs: Normal rate and effort Abd: Soft, NT, Gravid, S=D Extremities: 3+ pitting Pedal edema Neuro: 2+ deep tendon reflexes, No clonus  NST:  Baseline: 135 bpm, Variability: Good {> 6 bpm), Accelerations: Non-reactive but appropriate for gestational age and Decelerations: Absent  Results for orders placed or performed during the hospital encounter of 02/19/19 (from the past 24 hour(s))  Urinalysis, Routine w reflex microscopic     Status: Abnormal   Collection Time: 02/19/19  1:13 PM  Result Value Ref Range   Color, Urine YELLOW YELLOW   APPearance CLEAR CLEAR   Specific Gravity, Urine 1.006 1.005 - 1.030   pH 6.0 5.0 - 8.0   Glucose, UA NEGATIVE NEGATIVE mg/dL   Hgb urine dipstick NEGATIVE NEGATIVE   Bilirubin Urine NEGATIVE NEGATIVE   Ketones, ur NEGATIVE NEGATIVE  mg/dL   Protein, ur 100 (A) NEGATIVE mg/dL   Nitrite NEGATIVE NEGATIVE   Leukocytes,Ua NEGATIVE NEGATIVE   RBC / HPF 0-5 0 - 5 RBC/hpf   WBC, UA 0-5 0 - 5 WBC/hpf   Bacteria, UA RARE (A) NONE SEEN   Squamous Epithelial / LPF 0-5 0 - 5   Mucus PRESENT     A:  1. Chronic hypertension with superimposed preeclampsia  -severe range BPs treated with IV labetalol -pt asymptomatic -PEC labs stable  2. [redacted] weeks gestation of pregnancy      P:  [facility] Dr. DRosana HoesDr. BMelba Coonmade aware & agrees with recommendation Pt received BMZ during previous admission Will continue IV antihypertensive protocol Pt to continue oral meds  LJorje Guild NP 02/19/2019 2:01 PM

## 2019-02-19 NOTE — H&P (Signed)
Victoria George is a 43 y.o. female G1P0 at 59+ with CHTN and SiPE admitted for uncontrolled BP.  Currently on Procardia XL 60 bid and labetalol 200bid.  Admitted with close monitoring, has received BMZ and Magnesium.    OB History    Gravida  1   Para      Term      Preterm      AB      Living        SAB      TAB      Ectopic      Multiple      Live Births            G1 present, IVF Last pap 03/2017, no abn pap No STDs   Past Medical History:  Diagnosis Date  . Anemia 03/02/2011  . History of gastritis    12/ 2012  . History of ovarian cyst   . HTN (hypertension) 03/02/2011  . Hypertension   . Iron deficiency anemia   . Moderate persistent asthma, uncomplicated    [facility] dr Nelva Bush  . Seasonal allergic conjunctivitis   . Seasonal and perennial allergic rhinitis   . Submucous leiomyoma of uterus   . Wears glasses    Past Surgical History:  Procedure Laterality Date  . CARDIOVASCULAR STRESS TEST    . CHOLECYSTECTOMY  02/01/2011   Procedure: LAPAROSCOPIC CHOLECYSTECTOMY WITH INTRAOPERATIVE CHOLANGIOGRAM;  Surgeon: Odis Hollingshead, MD;  Location: [facility];  Service: General;  Laterality: N/A;  Needs IOC and C-arm  . DIAGNOSTIC LAPAROSCOPY  07/2007   ?Lysis Adhesions for obstructive right fallopian tube  . ESOPHAGOGASTRODUODENOSCOPY  01/30/2011   Procedure: ESOPHAGOGASTRODUODENOSCOPY (EGD);  Surgeon: Zenovia Jarred, MD;  Location: [facility];  Service: Gastroenterology;  Laterality: N/A;  . ESOPHAGOGASTRODUODENOSCOPY  03/03/2011   Procedure: ESOPHAGOGASTRODUODENOSCOPY (EGD);  Surgeon: Zenovia Jarred, MD;  Location: [facility];  Service: Gastroenterology;  Laterality: N/A;  . HYSTEROSCOPY N/A 03/22/2016   Procedure: HYSTEROSCOPY, POLYPECTOMY;  Surgeon: Governor Specking, MD;  Location: [facility];  Service: Gynecology;  Laterality: N/A;  . HYSTEROSCOPY WITH RESECTOSCOPE N/A 09/19/2017   Procedure: HYSTEROSCOPY MYOMECTOMY;  Surgeon:  Governor Specking, MD;  Location: [facility];  Service: Gynecology;  Laterality: N/A;  . LAPAROSCOPIC GELPORT ASSISTED MYOMECTOMY N/A 03/22/2016   Procedure: LAPAROSCOPIC GELPORT ASSISTED MYOMECTOMY, LYSIS OF ADHESIONS, CHROMOPERTUBATION;  Surgeon: Governor Specking, MD;  Location: [place];  Service: Gynecology;  Laterality: N/A;  . TRANSTHORACIC ECHOCARDIOGRAM     Family History: family history includes Colon cancer in her maternal grandmother; Diabetes in her brother, maternal grandmother, and paternal grandmother; Diverticulosis in her paternal grandmother; Hypertension in her mother; Kidney disease in her maternal grandmother. Social History:  reports that she has never smoked. She has never used smokeless tobacco. She reports previous alcohol use. She reports that she does not use drugs. married, counselor  Meds Labetalol, procardia, PNV All codeine, hydrocodone, oxycodone, tramadol, shellfish derived, oranges, pineapples, and milk products     Maternal Diabetes: No Genetic Screening: Declined - IVF donor egg Maternal Ultrasounds/Referrals: Normal Fetal Ultrasounds or other Referrals:  None Maternal Substance Abuse:  No Significant Maternal Medications:  Meds include: Other: Procardia, labetalol Significant Maternal Lab Results:  None Other Comments:  s/p BMZ  Review of Systems  Constitutional: Negative.   HENT: Negative.   Eyes: Negative.   Respiratory: Negative.   Cardiovascular: Negative.   Gastrointestinal: Negative.   Genitourinary: Negative.   Musculoskeletal: Negative.  Skin: Negative.   Neurological: Negative.   Psychiatric/Behavioral: Negative.    Maternal Medical History:  Contractions: Frequency: irregular.    Fetal activity: Perceived fetal activity is normal.    Prenatal complications: PIH and pre-eclampsia.   S/p IV, AMA, CHTN, SiPreE, h/o myomectomy - needs LTCS  Prenatal Complications - Diabetes: none.      Blood  pressure (!) 154/87, pulse 89, temperature 97.9 F (36.6 C), temperature source Oral, resp. rate 20, height 5' 2"  (1.575 m), weight 115.2 kg, SpO2 100 %. Maternal Exam:  Uterine Assessment: Contraction frequency is irregular.   Abdomen: Patient reports no abdominal tenderness. Surgical scars: low transverse.   Fundal height is appropriate for gestation.    Introitus: Normal vulva. Normal vagina.    Physical Exam  Constitutional: She is oriented to person, place, and time. She appears well-developed and well-nourished.  HENT:  Head: Normocephalic and atraumatic.  Cardiovascular: Normal rate and regular rhythm.  Respiratory: Effort normal and breath sounds normal. No respiratory distress. She has no wheezes.  GI: Soft. Bowel sounds are normal. She exhibits no distension. There is no abdominal tenderness.  Genitourinary:    Vulva normal.   Musculoskeletal: Normal range of motion.  Neurological: She is alert and oriented to person, place, and time.  Skin: Skin is warm and dry.  Psychiatric: She has a normal mood and affect. Her behavior is normal.    Prenatal labs: ABO, Rh: --/--/O POS (11/22 0518) Antibody: NEG (11/22 0518) Rubella: 8.36 (07/09 1504) RPR: Non Reactive (07/09 1504)  HBsAg: Negative (07/09 1504)  HIV: Non Reactive (07/09 1504)  GBS:     Hgb 12.4/Plt 293 - multiple checks have been stable; Chl neg/Chl neg/ Pap WNL/CF neg/glucola 124   Assessment/Plan: 43yo G1@30 + with CHTN and SiPE Close monotoring S/p BMZ BP control    Hasten Sweitzer Bovard-Stuckert 02/19/2019, 4:15 PM

## 2019-02-19 NOTE — MAU Note (Signed)
To restroom first.

## 2019-02-19 NOTE — Progress Notes (Signed)
Report given to M. Hoppenbauer, R, charge nurse.

## 2019-02-19 NOTE — MAU Note (Signed)
Dr Willis Modena sent her over from the office because her BP was elevated. Denies HA, visual changes,  Epigastric pain.  Some increased swelling in ankles. Pt is on Procardia 33m BID. Labetalol 2013mBID

## 2019-02-20 NOTE — Progress Notes (Signed)
HD #2, [redacted]W[redacted]D, CHTN with superimposed preeclampsia Feels ok, no problems, no preeclampsia symptoms Afeb, VSS, BP 131-164/74-90 FHT last pm reactive for 31 weeks Labs yesterday stable Will continue on max dose procardia and labetalol 200 mg bid, monitor BP closely.  Discussed that any day her BP could get high enough to require delivery.  Had [place] one week ago. She may ambulate in the halls

## 2019-02-21 NOTE — Progress Notes (Signed)
HD #3, [redacted]W[redacted]D, CHTN with superimposed preeclampsia Feels fine, nothing new Afeb, VSS, BP 130-150/60-70 FHT- reactive Will continue on Procardia and Labetalol, recheck labs tomorrow, close observation

## 2019-02-22 LAB — CBC
HCT: 31.4 % — ABNORMAL LOW (ref 36.0–46.0)
Hemoglobin: 10.6 g/dL — ABNORMAL LOW (ref 12.0–15.0)
MCH: 30.7 pg (ref 26.0–34.0)
MCHC: 33.8 g/dL (ref 30.0–36.0)
MCV: 91 fL (ref 80.0–100.0)
Platelets: 242 10*3/uL (ref 150–400)
RBC: 3.45 MIL/uL — ABNORMAL LOW (ref 3.87–5.11)
RDW: 13.6 % (ref 11.5–15.5)
WBC: 5.8 10*3/uL (ref 4.0–10.5)
nRBC: 0 % (ref 0.0–0.2)

## 2019-02-22 LAB — COMPREHENSIVE METABOLIC PANEL
ALT: 28 U/L (ref 0–44)
AST: 30 U/L (ref 15–41)
Albumin: 2.5 g/dL — ABNORMAL LOW (ref 3.5–5.0)
Alkaline Phosphatase: 94 U/L (ref 38–126)
Anion gap: 10 (ref 5–15)
BUN: 12 mg/dL (ref 6–20)
CO2: 19 mmol/L — ABNORMAL LOW (ref 22–32)
Calcium: 8.8 mg/dL — ABNORMAL LOW (ref 8.9–10.3)
Chloride: 109 mmol/L (ref 98–111)
Creatinine, Ser: 0.87 mg/dL (ref 0.44–1.00)
GFR calc Af Amer: >60 mL/min (ref >60)
GFR calc non Af Amer: >60 mL/min (ref >60)
Glucose, Bld: 109 mg/dL — ABNORMAL HIGH (ref 70–99)
Potassium: 3.4 mmol/L — ABNORMAL LOW (ref 3.5–5.1)
Sodium: 138 mmol/L (ref 135–145)
Total Bilirubin: 0.7 mg/dL (ref 0.3–1.2)
Total Protein: 5.8 g/dL — ABNORMAL LOW (ref 6.5–8.1)

## 2019-02-22 NOTE — Progress Notes (Signed)
Patient ID: Victoria George, female   DOB: 03/23/1976, 43 y.o.   MRN: [id number] HD #4  CHTN with superimposed preeclampsia  Feeling good Denies HA or PIH sx, good FM  BP stable  130-140/70-81 FHR category 1 on last night strip  Fundus NT  BP stable on labetalol 254m po BID and Procardia 661mL BID Last growth [place] with normal growth Previous myomectomy with planned c-section to deliver S/p betamethasone 02/08/19 and 02/09/19 Will check labs today

## 2019-02-23 LAB — CBC
HCT: 31.1 % — ABNORMAL LOW (ref 36.0–46.0)
Hemoglobin: 10.5 g/dL — ABNORMAL LOW (ref 12.0–15.0)
MCH: 31 pg (ref 26.0–34.0)
MCHC: 33.8 g/dL (ref 30.0–36.0)
MCV: 91.7 fL (ref 80.0–100.0)
Platelets: 256 10*3/uL (ref 150–400)
RBC: 3.39 MIL/uL — ABNORMAL LOW (ref 3.87–5.11)
RDW: 13.5 % (ref 11.5–15.5)
WBC: 6.1 10*3/uL (ref 4.0–10.5)
nRBC: 0 % (ref 0.0–0.2)

## 2019-02-23 LAB — TYPE AND SCREEN
ABO/RH(D): O POS
Antibody Screen: NEGATIVE

## 2019-02-23 NOTE — Progress Notes (Signed)
Patient ID: MONICA CODD, female   DOB: 04/13/1975, 43 y.o.   MRN: [id number] HD #5 CHTN with superimposed preeclampsia  31 3/7 weeks  Pt states feeling well.  No c/o no HA Good FM   BP stable 130-140's/69-82 FHR reassuring on monitoring-category 1  BP stable on labetalol 230m po BID and Procardia 648mL BID Last growth [place]20 with normal growth Previous myomectomy with planned c-section to deliver S/p betamethasone 02/08/19 and 02/09/19 Labs WNL, will plan to recheck 02/25/19 unless BP worsens Pt asks about going home but has bounced back x2 and d/w her we feel safest to keep her in-house for now

## 2019-02-24 LAB — COMPREHENSIVE METABOLIC PANEL
ALT: 29 U/L (ref 0–44)
AST: 32 U/L (ref 15–41)
Albumin: 2.5 g/dL — ABNORMAL LOW (ref 3.5–5.0)
Alkaline Phosphatase: 102 U/L (ref 38–126)
Anion gap: 9 (ref 5–15)
BUN: 10 mg/dL (ref 6–20)
CO2: 19 mmol/L — ABNORMAL LOW (ref 22–32)
Calcium: 9 mg/dL (ref 8.9–10.3)
Chloride: 108 mmol/L (ref 98–111)
Creatinine, Ser: 0.76 mg/dL (ref 0.44–1.00)
GFR calc Af Amer: >60 mL/min (ref >60)
GFR calc non Af Amer: >60 mL/min (ref >60)
Glucose, Bld: 89 mg/dL (ref 70–99)
Potassium: 3.5 mmol/L (ref 3.5–5.1)
Sodium: 136 mmol/L (ref 135–145)
Total Bilirubin: 0.4 mg/dL (ref 0.3–1.2)
Total Protein: 5.9 g/dL — ABNORMAL LOW (ref 6.5–8.1)

## 2019-02-24 LAB — CBC
HCT: 32.2 % — ABNORMAL LOW (ref 36.0–46.0)
Hemoglobin: 11.1 g/dL — ABNORMAL LOW (ref 12.0–15.0)
MCH: 31.1 pg (ref 26.0–34.0)
MCHC: 34.5 g/dL (ref 30.0–36.0)
MCV: 90.2 fL (ref 80.0–100.0)
Platelets: 255 10*3/uL (ref 150–400)
RBC: 3.57 MIL/uL — ABNORMAL LOW (ref 3.87–5.11)
RDW: 13.4 % (ref 11.5–15.5)
WBC: 5.8 10*3/uL (ref 4.0–10.5)
nRBC: 0 % (ref 0.0–0.2)

## 2019-02-24 MED ORDER — LABETALOL HCL 200 MG PO TABS
300.0000 mg | ORAL_TABLET | Freq: Three times a day (TID) | ORAL | Status: DC
  Administered 2019-02-25 – 2019-03-01 (×15): 300 mg via ORAL
  Filled 2019-02-24 (×15): qty 1

## 2019-02-24 MED ORDER — LABETALOL HCL 100 MG PO TABS
100.0000 mg | ORAL_TABLET | Freq: Once | ORAL | Status: AC
  Administered 2019-02-24: 100 mg via ORAL
  Filled 2019-02-24: qty 1

## 2019-02-24 NOTE — Progress Notes (Signed)
2 unsuccessful IV insertion attempts by 2 RN's. Patient requesting IV team at this time.

## 2019-02-24 NOTE — Progress Notes (Signed)
Patient ID: Victoria George, female   DOB: 05/12/1975, 43 y.o.   MRN: [id number]   43yo G1 at 31+4 CHTN, SiPE  +FM, no LOF, no VB, rare ctx.  No PIH sx's  AFVSS gen NAD FHTs 130's, mod var, appropriate for gestational age toco rare  Labs stable  Continue current management/close monitoring Procardia 60XL bid Labetalol 210m bid S/p BMZ

## 2019-02-24 NOTE — Consult Note (Signed)
  Prenatal Consult    I was asked by Dr. Sandford Craze to consult on this patient for preterm delivery.  She is a 43 yo G1 at 31+4 who has [place] with [facility].  Blood pressures continue to be difficult to control and OB is considering delivery tonight.  If blood pressures do normalize tonight, plan per MFM is for delivery at 32 weeks.  I spoke with Victoria George in her room; father was asleep on couch. They are expecting a girl named Victoria George.   I explained that the neonatal intensive care team would be present for the delivery and outlined the likely delivery room course for this baby including routine resuscitation and NRP-guided approaches to the treatment of respiratory distress. We discussed other common problems associated with prematurity including respiratory distress syndrome/CLD, apnea, feeding issues, temperature regulation, and infection risk.  We briefly discussed IVH/PVL, ROP, and NEC and that these are complications associated with prematurity, but that by 30 weeks are uncommon.    We discussed the average length of stay but I noted that the actual LOS would depend on the severity of problems encountered and response to treatments.  We discussed visitation policies and the resources available while her child is in the hospital.  We discussed the importance of good nutrition and various methods of providing nutrition (parenteral hyperalimentation, gavage feedings and/or oral feeding). We discussed the benefits of human milk. I encouraged breast feeding and pumping soon after birth and outlined resources that are available to support breast feeding.    Thank you for involving [place] in the care of this patient. A member of our team will be available should the family have additional questions.  Time for consultation approximately 20 minutes.   _____________________ Electronically Signed By: Towana Badger, MD, MS Neonatologist

## 2019-02-24 NOTE — Progress Notes (Addendum)
Patient ID: Victoria George, female   DOB: 10/24/1975, 43 y.o.   MRN: [id number]  Called with elevated BP. 170/90 treated w IV labetalol, improved; repeat 158/86, then repeat 176/88, treated again with 73m then 474m D/w Dr BoGertie ExonPt is s/p BMZ x 2, Magnesium for CP prophylaxis Admitted last Wednesday on current regimen Procardia XL 60 bid and Labetalol 200 bid Pt asymptomatic for PreE  As pt is only 31+ weeks, attempt to get to 32 weeks,  Change labetalol to 300 tid Stagger dosing of antihypertensives.  If cannot control BP with IV regimen, deliver tonight.  D/W pt.  Will make NPO, repeat labs  D/W pt r/b/a of LTCS including but not limited to bleeding, infection, damage to surrounding organs, trouble w incision healing and injury to infant.  (Needs C/S due to h/o myomectomy)  D/W pt NICU admission would like to talk to NICU team, will try to arrange this PM

## 2019-02-24 NOTE — Progress Notes (Signed)
IV team with patient, obtaining IV access.

## 2019-02-25 MED ORDER — BETAMETHASONE SOD PHOS & ACET 6 (3-3) MG/ML IJ SUSP: 12.5000 mg | Freq: Once | INTRAMUSCULAR | Status: DC

## 2019-02-25 MED ORDER — BETAMETHASONE SOD PHOS & ACET 6 (3-3) MG/ML IJ SUSP
12.0000 mg | Freq: Once | INTRAMUSCULAR | Status: AC
  Administered 2019-02-25: 12 mg via INTRAMUSCULAR
  Filled 2019-02-25: qty 5

## 2019-02-25 NOTE — Progress Notes (Signed)
S: Patient feeling better since initial visit. Denies "foggy' feeling along with other PreE questions, UE edema still present. Rpt Bps this AM improved.  O: BP 137/84 (BP Location: Left Arm)   Pulse 87   Temp 98.3 F (36.8 C) (Oral)   Resp 18   Ht 5' 2"  (1.575 m)   Wt 115.2 kg   LMP  (Approximate) Comment: has been on IVF, embryo transfer date 5/15, LMP 4/25  SpO2 99%   BMI 46.46 kg/m  Gen: Pleasant AAF in NAD sitting up in bed CV: RRR, CTAB Abd: gravid fundus appropriate w/ EGA MSK: UE BL 1+ pitting edema to antecubital fossa. BL LE edema 1+ to mid-anterior tibia, SCDs not currently on  Cr 0.76, 32/29, H/H 11.1/32.2, plt 255  A/P: This is a 43yo G1P0 @ 31 5/7 admitted HD#6 now with SI-PreE without SF (incr BP meds, incr uPC from prior admission). PNC c/b AMA, obesity and IVF pregnancy w/ h/o myomectomy necessitating PLTCS this pregnancy  1) SI-PreE -BP 130s-150s/80s-90s -Nif 60BID, Lab 316m TID increased this admission    *Last required IV overnight (labetalol protocol through 493m -Repeat CBC/CMP stable from admission, uPC now 1.66 -Keep T&S active>due 12/3  2) IUP @ 31 5/7 -S/p BMTZ on 11/14-15, received MgSO4 for CP ppx during 11/14-16 admission    *Given overnight pressures and close eye, rescue dose initiated this AM at 0930 -PNV -NST q shift: reactive thus far -GS done on 11/16: breech, post placenta, AFI WNL    *rpt GS @ 32wks if still pregnant -S/p NICU consult  Per Dr BoRoe Rutherfordote, spoke with MFM, ideally will get patient to [redacted]wks EGA. If IV meds required again in entirety of protocol, will move towards section and repeat MgSO4 to continue for 24hrs pp for seizure ppx.   Will allow one time clear tray and patient to shower then NPO after

## 2019-02-25 NOTE — Progress Notes (Signed)
Presented for AM evaluation. BP w/ systolics 329V. Per MFM, would attempt to get to [redacted]wks EGA. Today, 31 5/7. This morning, patient feels "foggy" but denies frank N/V, HA, visual changes, thinks she is just sleepy. Also notes BL UE edema, states this has been present for two days. Reviewed, again, concern for ongoing SI PreE w/ concern for severe features. Will re-eval patient in 1 hour after anticipated SVD on L&D. If still concerning for worsening PreE, will move forward with PLTCS.   BSUS shows baby complete breech, back down, head on upper maternal left. Will order rescue dose of BMTZ at this time. Continue NPO

## 2019-02-25 NOTE — Progress Notes (Signed)
Patient feels significantly improved this evening. Had a salty hot dog last night, wonder if this is why she "swole up." Denies PreE symptoims. Edema in UE now resolved. BP 140s-150s/80s-90s. CLD at this time.

## 2019-02-26 LAB — CBC
HCT: 31.1 % — ABNORMAL LOW (ref 36.0–46.0)
Hemoglobin: 10.8 g/dL — ABNORMAL LOW (ref 12.0–15.0)
MCH: 31.3 pg (ref 26.0–34.0)
MCHC: 34.7 g/dL (ref 30.0–36.0)
MCV: 90.1 fL (ref 80.0–100.0)
Platelets: 267 10*3/uL (ref 150–400)
RBC: 3.45 MIL/uL — ABNORMAL LOW (ref 3.87–5.11)
RDW: 13.4 % (ref 11.5–15.5)
WBC: 6.2 10*3/uL (ref 4.0–10.5)
nRBC: 0 % (ref 0.0–0.2)

## 2019-02-26 LAB — COMPREHENSIVE METABOLIC PANEL
ALT: 28 U/L (ref 0–44)
AST: 31 U/L (ref 15–41)
Albumin: 2.6 g/dL — ABNORMAL LOW (ref 3.5–5.0)
Alkaline Phosphatase: 101 U/L (ref 38–126)
Anion gap: 11 (ref 5–15)
BUN: 14 mg/dL (ref 6–20)
CO2: 17 mmol/L — ABNORMAL LOW (ref 22–32)
Calcium: 8.7 mg/dL — ABNORMAL LOW (ref 8.9–10.3)
Chloride: 107 mmol/L (ref 98–111)
Creatinine, Ser: 0.94 mg/dL (ref 0.44–1.00)
GFR calc Af Amer: >60 mL/min (ref >60)
GFR calc non Af Amer: >60 mL/min (ref >60)
Glucose, Bld: 105 mg/dL — ABNORMAL HIGH (ref 70–99)
Potassium: 3.6 mmol/L (ref 3.5–5.1)
Sodium: 135 mmol/L (ref 135–145)
Total Bilirubin: 0.4 mg/dL (ref 0.3–1.2)
Total Protein: 5.8 g/dL — ABNORMAL LOW (ref 6.5–8.1)

## 2019-02-26 LAB — TYPE AND SCREEN
ABO/RH(D): O POS
Antibody Screen: NEGATIVE

## 2019-02-26 LAB — PROTEIN / CREATININE RATIO, URINE
Creatinine, Urine: 281.78 mg/dL
Protein Creatinine Ratio: 0.65 mg/mg{Cre} — ABNORMAL HIGH (ref 0.00–0.15)
Total Protein, Urine: 183 mg/dL

## 2019-02-26 MED ORDER — FUROSEMIDE 20 MG PO TABS
20.0000 mg | ORAL_TABLET | Freq: Once | ORAL | Status: AC
  Administered 2019-02-26: 20 mg via ORAL
  Filled 2019-02-26: qty 1

## 2019-02-26 MED ORDER — BETAMETHASONE SOD PHOS & ACET 6 (3-3) MG/ML IJ SUSP
12.0000 mg | Freq: Once | INTRAMUSCULAR | Status: AC
  Administered 2019-02-26: 12 mg via INTRAMUSCULAR
  Filled 2019-02-26: qty 5

## 2019-02-26 NOTE — Progress Notes (Addendum)
HD #8, [redacted]W[redacted]D, CHTN with superimposed preeclampsia Feels fine except for more edema, +FM Afeb, VSS, BP 129-154/71-85 FHT this am reactive Labs stable, Cre bumped up slightly from 0.76 to 0.94  -Will continue on Procardia XL 60 mg bid, labetalol 300 mg bid, monitor BP closely, IV Labetalol prn, consider delivery if requires more IV meds.  Edema a bit worse this am, was given one dose of lasix, will not give any more lasix.  Labs stable.  Received 1st dose of second course of betamethasone yesterday, second dose today.  Will allow regular diet again since BP stabilized.    -Will need c-section for delivery due to previous myomectomy

## 2019-02-26 NOTE — Progress Notes (Signed)
Notified by RN regarding new onset dependent edema in upper thighs and UE this AM at approx 0530, patient feels that face is somewhat puffy as well. Denies other PreE symptoms. BP last 129/71. Patient does anaphylactic reaction, still only on CLD, denies new lotions/chap sticks, etc. Denies urticaria.  1-2+ ptting edema in upper thighs near buttocks. 1+ on dorsal surface of UE. C/w dependent edema. Lsaix 58m PO x1 given.Low concern for worsening of SI PreE at this time, however will repeat stat labs to trend.

## 2019-02-27 NOTE — Progress Notes (Signed)
Initial Nutrition Assessment  DOCUMENTATION CODES:  Morbid obesity  INTERVENTION:  Regular diet Snacks and double protein portions upon request  NUTRITION DIAGNOSIS:   Increased nutrient needs related to (pregnancy and fetal growth requirements) as evidenced by (32 weeks IUP).  GOAL:  Patient will meet greater than or equal to 90% of their needs  MONITOR:  Weight trends  REASON FOR ASSESSMENT:  Antenatal   ASSESSMENT:  Now 32 weeks, Adm with PEC. Wt at initial prenatal visit 242 lbs, BMI 44.4, 11 lbs overall weight gain. c/s for delivery   Diet Order:   Diet Order            Diet regular Room service appropriate? Yes; Fluid consistency: Thin  Diet effective now              EDUCATION NEEDS:   No education needs have been identified at this time  Skin:  Skin Assessment: Reviewed RN Assessment  Height:   Ht Readings from Last 1 Encounters:  02/19/19 5' 2"  (1.575 m)    Weight:   Wt Readings from Last 1 Encounters:  02/19/19 115.2 kg    Ideal Body Weight:   110 g  BMI:  Body mass index is 46.46 kg/m.  Estimated Nutritional Needs:   Kcal:  2200-2400  Protein:  98-108 g  Fluid:  2.5L    Weyman Rodney M.Fredderick Severance LDN Neonatal Nutrition Support Specialist/RD III Pager [phone]      Phone [phone]

## 2019-02-27 NOTE — Progress Notes (Addendum)
Patient ID: Victoria George, female   DOB: Jan 16, 1976, 43 y.o.   MRN: [id number] HD #9   32 0/7 weeks CHTN with superimposed preeclampsia  Pt denies HA or PIH sx, edema unchanged  FHT Category 1 last PM BP stable at 130-150/68-86  Fundus NT  BP stable on labetalol 332m po BID and Procardia 631mL BID Last growth [place]20 with normal growth Previous myomectomy with planned c-sectionto deliver S/p betamethasone 02/08/19 and 02/09/19 Labs WNLyesterday, will plan to recheck tomorrow unless BP worsens, prot:creat ratio decreased slightly to .65

## 2019-02-28 LAB — CBC
HCT: 29.7 % — ABNORMAL LOW (ref 36.0–46.0)
HCT: 29.7 % — ABNORMAL LOW (ref 36.0–46.0)
Hemoglobin: 10 g/dL — ABNORMAL LOW (ref 12.0–15.0)
Hemoglobin: 10 g/dL — ABNORMAL LOW (ref 12.0–15.0)
MCH: 31 pg (ref 26.0–34.0)
MCH: 30.8 pg (ref 26.0–34.0)
MCHC: 33.7 g/dL (ref 30.0–36.0)
MCHC: 33.7 g/dL (ref 30.0–36.0)
MCV: 92 fL (ref 80.0–100.0)
MCV: 91.4 fL (ref 80.0–100.0)
Platelets: 260 10*3/uL (ref 150–400)
Platelets: 250 10*3/uL (ref 150–400)
RBC: 3.23 MIL/uL — ABNORMAL LOW (ref 3.87–5.11)
RBC: 3.25 MIL/uL — ABNORMAL LOW (ref 3.87–5.11)
RDW: 13.5 % (ref 11.5–15.5)
RDW: 13.6 % (ref 11.5–15.5)
WBC: 7.5 10*3/uL (ref 4.0–10.5)
WBC: 7.1 10*3/uL (ref 4.0–10.5)
nRBC: 0.7 % — ABNORMAL HIGH (ref 0.0–0.2)
nRBC: 0.4 % — ABNORMAL HIGH (ref 0.0–0.2)

## 2019-02-28 LAB — LACTATE DEHYDROGENASE: LDH: 221 U/L — ABNORMAL HIGH (ref 98–192)

## 2019-02-28 LAB — HEPATIC FUNCTION PANEL
ALT: 50 U/L — ABNORMAL HIGH (ref 0–44)
AST: 50 U/L — ABNORMAL HIGH (ref 15–41)
Albumin: 2.6 g/dL — ABNORMAL LOW (ref 3.5–5.0)
Alkaline Phosphatase: 102 U/L (ref 38–126)
Bilirubin, Direct: <0.1 mg/dL (ref 0.0–0.2)
Total Bilirubin: 0.2 mg/dL — ABNORMAL LOW (ref 0.3–1.2)
Total Protein: 6 g/dL — ABNORMAL LOW (ref 6.5–8.1)

## 2019-02-28 LAB — URIC ACID: Uric Acid, Serum: 8 mg/dL — ABNORMAL HIGH (ref 2.5–7.1)

## 2019-02-28 LAB — COMPREHENSIVE METABOLIC PANEL
ALT: 40 U/L (ref 0–44)
AST: 45 U/L — ABNORMAL HIGH (ref 15–41)
Albumin: 2.8 g/dL — ABNORMAL LOW (ref 3.5–5.0)
Alkaline Phosphatase: 108 U/L (ref 38–126)
Anion gap: 9 (ref 5–15)
BUN: 16 mg/dL (ref 6–20)
CO2: 19 mmol/L — ABNORMAL LOW (ref 22–32)
Calcium: 8.8 mg/dL — ABNORMAL LOW (ref 8.9–10.3)
Chloride: 109 mmol/L (ref 98–111)
Creatinine, Ser: 0.93 mg/dL (ref 0.44–1.00)
GFR calc Af Amer: >60 mL/min (ref >60)
GFR calc non Af Amer: >60 mL/min (ref >60)
Glucose, Bld: 86 mg/dL (ref 70–99)
Potassium: 3.4 mmol/L — ABNORMAL LOW (ref 3.5–5.1)
Sodium: 137 mmol/L (ref 135–145)
Total Bilirubin: 0.5 mg/dL (ref 0.3–1.2)
Total Protein: 5.8 g/dL — ABNORMAL LOW (ref 6.5–8.1)

## 2019-02-28 NOTE — Progress Notes (Signed)
Patient ID: Victoria George, female   DOB: August 31, 1975, 43 y.o.   MRN: [id number] Pt doing well. Had a bout of vomiting this am but states feels better now. No HA or blurry vision +Fms. No contractions  VS- 147/82. 18, 90 GEN - NAD EFM - 125, cat 1 TOCO - no contractions SVE - deferred  Labs: AST 45 ( from 31) and ALT 40 ( from 28); plts nl  A/P: HD#10  BP stable on labetalol 356m po BID and                           Procardia 666mL BID             Last growth [place]16[place]20 with normal growth- repeat on 12/7  Previous myomectomy with planned c-sto deliver S/p betamethasone 02/08/19 and 02/09/19 Labs: slightly increasing LFTs,prot:creat ratio decreased slightly to .65 on 12/3

## 2019-02-28 NOTE — Progress Notes (Signed)
Patient ID: Victoria George, female   DOB: 1975-08-10, 43 y.o.   MRN: [id number] Pt reports has felt fatigued today. Did not have any more vomiting after episode this am; tolerated lunch and dinner. No HA or blurry vision. No diarrhea or fever.  VS - 143/80 ( 143-151/80-83), 93 GEN - NAD ABD - c/w ga EXT - no homans, scds off  EFM - 135, +acels, no decels, cat 1 TOCO - no contractions SVE - deferred  A/P: HD#10    BP stable on labetalol370m po BID and                           Procardia 648mL BID    Recheck labs tonight given slight bump in LFTs this am

## 2019-03-01 ENCOUNTER — Encounter (HOSPITAL_COMMUNITY): Payer: Self-pay | Admitting: Anesthesiology

## 2019-03-01 ENCOUNTER — Encounter (HOSPITAL_COMMUNITY): Payer: Self-pay | Admitting: Certified Registered Nurse Anesthetist

## 2019-03-01 LAB — TYPE AND SCREEN
ABO/RH(D): O POS
Antibody Screen: NEGATIVE

## 2019-03-01 LAB — CBC
HCT: 30.7 % — ABNORMAL LOW (ref 36.0–46.0)
HCT: 31.9 % — ABNORMAL LOW (ref 36.0–46.0)
Hemoglobin: 10.2 g/dL — ABNORMAL LOW (ref 12.0–15.0)
Hemoglobin: 10.9 g/dL — ABNORMAL LOW (ref 12.0–15.0)
MCH: 31 pg (ref 26.0–34.0)
MCH: 31.1 pg (ref 26.0–34.0)
MCHC: 33.2 g/dL (ref 30.0–36.0)
MCHC: 34.2 g/dL (ref 30.0–36.0)
MCV: 93.3 fL (ref 80.0–100.0)
MCV: 90.9 fL (ref 80.0–100.0)
Platelets: 264 10*3/uL (ref 150–400)
Platelets: 265 10*3/uL (ref 150–400)
RBC: 3.29 MIL/uL — ABNORMAL LOW (ref 3.87–5.11)
RBC: 3.51 MIL/uL — ABNORMAL LOW (ref 3.87–5.11)
RDW: 13.8 % (ref 11.5–15.5)
RDW: 13.5 % (ref 11.5–15.5)
WBC: 7.4 10*3/uL (ref 4.0–10.5)
WBC: 6.6 10*3/uL (ref 4.0–10.5)
nRBC: 0.4 % — ABNORMAL HIGH (ref 0.0–0.2)
nRBC: 0 % (ref 0.0–0.2)

## 2019-03-01 LAB — HEPATIC FUNCTION PANEL
ALT: 50 U/L — ABNORMAL HIGH (ref 0–44)
AST: 54 U/L — ABNORMAL HIGH (ref 15–41)
Albumin: 2.6 g/dL — ABNORMAL LOW (ref 3.5–5.0)
Alkaline Phosphatase: 101 U/L (ref 38–126)
Bilirubin, Direct: <0.1 mg/dL (ref 0.0–0.2)
Total Bilirubin: 0.4 mg/dL (ref 0.3–1.2)
Total Protein: 5.6 g/dL — ABNORMAL LOW (ref 6.5–8.1)

## 2019-03-01 LAB — URIC ACID: Uric Acid, Serum: 7.9 mg/dL — ABNORMAL HIGH (ref 2.5–7.1)

## 2019-03-01 LAB — LACTATE DEHYDROGENASE: LDH: 226 U/L — ABNORMAL HIGH (ref 98–192)

## 2019-03-01 MED ORDER — LABETALOL HCL 5 MG/ML IV SOLN: 40.0000 mg | INTRAVENOUS | Status: DC | PRN

## 2019-03-01 MED ORDER — LABETALOL HCL 5 MG/ML IV SOLN: 20.0000 mg | INTRAVENOUS | Status: DC | PRN

## 2019-03-01 MED ORDER — ONDANSETRON HCL 4 MG/2ML IJ SOLN
INTRAMUSCULAR | Status: AC
  Filled 2019-03-01: qty 2

## 2019-03-01 MED ORDER — MORPHINE SULFATE (PF) 0.5 MG/ML IJ SOLN
INTRAMUSCULAR | Status: AC
  Filled 2019-03-01: qty 10

## 2019-03-01 MED ORDER — OXYTOCIN 40 UNITS IN NORMAL SALINE INFUSION - SIMPLE MED
INTRAVENOUS | Status: AC
  Filled 2019-03-01: qty 1000

## 2019-03-01 MED ORDER — PHENYLEPHRINE HCL-NACL 20-0.9 MG/250ML-% IV SOLN
INTRAVENOUS | Status: AC
  Filled 2019-03-01: qty 250

## 2019-03-01 MED ORDER — LABETALOL HCL 5 MG/ML IV SOLN
20.0000 mg | INTRAVENOUS | Status: DC | PRN
  Administered 2019-03-01: 20 mg via INTRAVENOUS
  Filled 2019-03-01 (×2): qty 4

## 2019-03-01 MED ORDER — HYDRALAZINE HCL 20 MG/ML IJ SOLN: 10.0000 mg | INTRAMUSCULAR | Status: DC | PRN

## 2019-03-01 MED ORDER — FENTANYL CITRATE (PF) 100 MCG/2ML IJ SOLN
INTRAMUSCULAR | Status: AC
  Filled 2019-03-01: qty 2

## 2019-03-01 MED ORDER — HYDRALAZINE HCL 20 MG/ML IJ SOLN: 5.0000 mg | INTRAMUSCULAR | Status: DC | PRN

## 2019-03-01 MED ORDER — LABETALOL HCL 5 MG/ML IV SOLN
80.0000 mg | INTRAVENOUS | Status: DC | PRN
  Filled 2019-03-01: qty 16

## 2019-03-01 MED ORDER — LABETALOL HCL 5 MG/ML IV SOLN
40.0000 mg | INTRAVENOUS | Status: DC | PRN
  Filled 2019-03-01: qty 8

## 2019-03-01 MED ORDER — DEXAMETHASONE SODIUM PHOSPHATE 4 MG/ML IJ SOLN
INTRAMUSCULAR | Status: AC
  Filled 2019-03-01: qty 1

## 2019-03-01 NOTE — Plan of Care (Signed)
Blood pressures continue to fluctuate as well as lab work.Plan of care remains the same at this time.

## 2019-03-01 NOTE — Progress Notes (Addendum)
Patient ID: Victoria George, female   DOB: [birth year], 43 y.o.   MRN: [id number] Pt reports feels much better today. She denies HA or blurry vision or nausea/vomiting. +Fms, no contractions. Hungry VS - 143/73 (154-155/75-80 overnight), 91 GEN - NAD ABD - c/w GA EXT - no clonus, mild edema, +2 reflexes  EFM - 135, cat 1 TOCO - no contractions SVE - deferred  AST 54 ( from 50) and ALT 50- stable LDH stable at 226, Plts stable at 264, uric acid stable at 7.9  A/P: HD#11   BP stable on labetalol338m po BID and  Procardia 641mL BID    Will allow pt to eat ( kept her NPO after midnight)     Discussed plan as follows: If IV medication needed to control BP, if pt becomes symptomatic ( especially if lab work worsens concurrently) or cat 3 strip noted, these will be indications to proceed with delivery.         Continue current care at present

## 2019-03-01 NOTE — Progress Notes (Signed)
Labetalol 20 mg  IV push given at 2038 and repeat blood pressure at 2052 was 153/79. Dr Terri Piedra notified of result and talked with patient about proceeding with C-Section at midnight.

## 2019-03-01 NOTE — Anesthesia Preprocedure Evaluation (Deleted)
Anesthesia Evaluation  Patient identified by MRN, date of birth, ID bandGeneral Assessment Comment:Last ate at 1800  Reviewed: Allergy & Precautions, Patient's Chart, lab work & pertinent test results  Airway        Dental   Pulmonary asthma ,           Cardiovascular hypertension, Pt. on medications   Chronic HTn w preeclampsia   Neuro/Psych negative neurological ROS  negative psych ROS   GI/Hepatic negative GI ROS, AST 54 ALT 50   Endo/Other  negative endocrine ROS  Renal/GU      Musculoskeletal   Abdominal (+) + obese,   Peds  Hematology Hgb 10.9 Plt 265 T&S available   Anesthesia Other Findings   Reproductive/Obstetrics (+) Pregnancy                            Anesthesia Physical Anesthesia Plan  ASA: III  Anesthesia Plan: Spinal   Post-op Pain Management:    Induction:   PONV Risk Score and Plan: Treatment may vary due to age or medical condition  Airway Management Planned: Nasal Cannula and Natural Airway  Additional Equipment: None  Intra-op Plan:   Post-operative Plan:   Informed Consent: I have reviewed the patients History and Physical, chart, labs and discussed the procedure including the risks, benefits and alternatives for the proposed anesthesia with the patient or authorized representative who has indicated his/her understanding and acceptance.       Plan Discussed with:   Anesthesia Plan Comments: (8 Wk G1P0 w Chronic Htn and Superimposed Preeclampsia for Spinal for C/S)        Anesthesia Quick Evaluation

## 2019-03-01 NOTE — Progress Notes (Signed)
At [place] took patients blood pressure and it was 167/92.Denies H/A, blurred vision, or RUQ pain.Reflexes are 2+ no clonus.Blood pressure repeated at 2000 168/88 in left arm and at 2004 165/89 in right arm.Dr Terri Piedra notified and wants to proceed with IV Labetalol protocol.

## 2019-03-02 ENCOUNTER — Encounter (HOSPITAL_COMMUNITY)
Admission: AD | Disposition: A | Discharge status: Home / Self Care | Payer: Self-pay | Source: Home / Self Care | Attending: Obstetrics and Gynecology

## 2019-03-02 ENCOUNTER — Inpatient Hospital Stay (HOSPITAL_COMMUNITY): Payer: PRIVATE HEALTH INSURANCE | Admitting: Anesthesiology

## 2019-03-02 ENCOUNTER — Inpatient Hospital Stay (HOSPITAL_COMMUNITY): Payer: PRIVATE HEALTH INSURANCE

## 2019-03-02 ENCOUNTER — Encounter (HOSPITAL_COMMUNITY): Payer: Self-pay

## 2019-03-02 DIAGNOSIS — O3413 Maternal care for benign tumor of corpus uteri, third trimester: Secondary | ICD-10-CM

## 2019-03-02 DIAGNOSIS — Z98891 History of uterine scar from previous surgery: Secondary | ICD-10-CM

## 2019-03-02 DIAGNOSIS — D259 Leiomyoma of uterus, unspecified: Secondary | ICD-10-CM

## 2019-03-02 HISTORY — DX: History of uterine scar from previous surgery: Z98.891

## 2019-03-02 HISTORY — DX: Maternal care for benign tumor of corpus uteri, third trimester: O34.13

## 2019-03-02 LAB — COMPREHENSIVE METABOLIC PANEL
ALT: 54 U/L — ABNORMAL HIGH (ref 0–44)
AST: 53 U/L — ABNORMAL HIGH (ref 15–41)
Albumin: 2.4 g/dL — ABNORMAL LOW (ref 3.5–5.0)
Alkaline Phosphatase: 103 U/L (ref 38–126)
Anion gap: 7 (ref 5–15)
BUN: 9 mg/dL (ref 6–20)
CO2: 20 mmol/L — ABNORMAL LOW (ref 22–32)
Calcium: 8.4 mg/dL — ABNORMAL LOW (ref 8.9–10.3)
Chloride: 109 mmol/L (ref 98–111)
Creatinine, Ser: 0.76 mg/dL (ref 0.44–1.00)
GFR calc Af Amer: >60 mL/min (ref >60)
GFR calc non Af Amer: >60 mL/min (ref >60)
Glucose, Bld: 116 mg/dL — ABNORMAL HIGH (ref 70–99)
Potassium: 4 mmol/L (ref 3.5–5.1)
Sodium: 136 mmol/L (ref 135–145)
Total Bilirubin: 0.4 mg/dL (ref 0.3–1.2)
Total Protein: 5.7 g/dL — ABNORMAL LOW (ref 6.5–8.1)

## 2019-03-02 LAB — CBC
HCT: 33 % — ABNORMAL LOW (ref 36.0–46.0)
HCT: 33.4 % — ABNORMAL LOW (ref 36.0–46.0)
Hemoglobin: 11.3 g/dL — ABNORMAL LOW (ref 12.0–15.0)
Hemoglobin: 11.3 g/dL — ABNORMAL LOW (ref 12.0–15.0)
MCH: 31.2 pg (ref 26.0–34.0)
MCH: 31.2 pg (ref 26.0–34.0)
MCHC: 34.2 g/dL (ref 30.0–36.0)
MCHC: 33.8 g/dL (ref 30.0–36.0)
MCV: 91.2 fL (ref 80.0–100.0)
MCV: 92.3 fL (ref 80.0–100.0)
Platelets: 274 10*3/uL (ref 150–400)
Platelets: 300 10*3/uL (ref 150–400)
RBC: 3.62 MIL/uL — ABNORMAL LOW (ref 3.87–5.11)
RBC: 3.62 MIL/uL — ABNORMAL LOW (ref 3.87–5.11)
RDW: 13.4 % (ref 11.5–15.5)
RDW: 13.6 % (ref 11.5–15.5)
WBC: 12.6 10*3/uL — ABNORMAL HIGH (ref 4.0–10.5)
WBC: 14.9 10*3/uL — ABNORMAL HIGH (ref 4.0–10.5)
nRBC: 0 % (ref 0.0–0.2)
nRBC: 0 % (ref 0.0–0.2)

## 2019-03-02 LAB — URIC ACID: Uric Acid, Serum: 6.7 mg/dL (ref 2.5–7.1)

## 2019-03-02 LAB — LACTATE DEHYDROGENASE: LDH: 268 U/L — ABNORMAL HIGH (ref 98–192)

## 2019-03-02 LAB — MRSA PCR SCREENING: MRSA by PCR: NEGATIVE

## 2019-03-02 SURGERY — Surgical Case
Anesthesia: Spinal

## 2019-03-02 SURGERY — Surgical Case
Anesthesia: Regional

## 2019-03-02 MED ORDER — LABETALOL HCL 5 MG/ML IV SOLN
INTRAVENOUS | Status: AC
  Filled 2019-03-02: qty 4

## 2019-03-02 MED ORDER — LABETALOL HCL 5 MG/ML IV SOLN
40.0000 mg | INTRAVENOUS | Status: DC | PRN
  Administered 2019-03-02: 40 mg via INTRAVENOUS

## 2019-03-02 MED ORDER — ONDANSETRON HCL 4 MG/2ML IJ SOLN
INTRAMUSCULAR | Status: DC | PRN
  Administered 2019-03-02: 4 mg via INTRAVENOUS

## 2019-03-02 MED ORDER — NALBUPHINE HCL 10 MG/ML IJ SOLN: 5.0000 mg | Freq: Once | INTRAMUSCULAR | Status: DC | PRN

## 2019-03-02 MED ORDER — ONDANSETRON HCL 4 MG/2ML IJ SOLN
4.0000 mg | Freq: Once | INTRAMUSCULAR | Status: AC | PRN
  Administered 2019-03-02: 4 mg via INTRAVENOUS

## 2019-03-02 MED ORDER — HYDRALAZINE HCL 20 MG/ML IJ SOLN
5.0000 mg | INTRAMUSCULAR | Status: DC | PRN
  Administered 2019-03-02: 5 mg via INTRAVENOUS

## 2019-03-02 MED ORDER — LACTATED RINGERS IV SOLN
INTRAVENOUS | Status: DC | PRN
  Administered 2019-03-02 (×2): via INTRAVENOUS

## 2019-03-02 MED ORDER — MORPHINE SULFATE (PF) 0.5 MG/ML IJ SOLN
INTRAMUSCULAR | Status: DC | PRN
  Administered 2019-03-02: .15 mg via INTRATHECAL

## 2019-03-02 MED ORDER — COCONUT OIL OIL: 1.0000 "application " | TOPICAL_OIL | Status: DC | PRN

## 2019-03-02 MED ORDER — PRENATAL MULTIVITAMIN CH
1.0000 | ORAL_TABLET | Freq: Every day | ORAL | Status: DC
  Administered 2019-03-02 – 2019-03-05 (×4): 1 via ORAL
  Filled 2019-03-02 (×4): qty 1

## 2019-03-02 MED ORDER — ONDANSETRON HCL 4 MG/2ML IJ SOLN
4.0000 mg | Freq: Three times a day (TID) | INTRAMUSCULAR | Status: DC | PRN
  Administered 2019-03-02 – 2019-03-03 (×2): 4 mg via INTRAVENOUS
  Filled 2019-03-02 (×2): qty 2

## 2019-03-02 MED ORDER — NALBUPHINE HCL 10 MG/ML IJ SOLN: 5.0000 mg | INTRAMUSCULAR | Status: DC | PRN

## 2019-03-02 MED ORDER — MENTHOL 3 MG MT LOZG: 1.0000 | LOZENGE | OROMUCOSAL | Status: DC | PRN

## 2019-03-02 MED ORDER — LACTATED RINGERS IV SOLN
INTRAVENOUS | Status: DC
  Administered 2019-03-02: 09:00:00 via INTRAVENOUS

## 2019-03-02 MED ORDER — MAGNESIUM SULFATE BOLUS VIA INFUSION
4.0000 g | Freq: Once | INTRAVENOUS | Status: AC
  Administered 2019-03-02: 4 g via INTRAVENOUS
  Filled 2019-03-02: qty 1000

## 2019-03-02 MED ORDER — LABETALOL HCL 5 MG/ML IV SOLN
80.0000 mg | INTRAVENOUS | Status: DC | PRN
  Administered 2019-03-02: 80 mg via INTRAVENOUS
  Filled 2019-03-02: qty 16

## 2019-03-02 MED ORDER — BUPIVACAINE IN DEXTROSE 0.75-8.25 % IT SOLN
INTRATHECAL | Status: DC | PRN
  Administered 2019-03-02: 1.6 mL via INTRATHECAL

## 2019-03-02 MED ORDER — ZOLPIDEM TARTRATE 5 MG PO TABS: 5.0000 mg | ORAL_TABLET | Freq: Every evening | ORAL | Status: DC | PRN

## 2019-03-02 MED ORDER — HYDROMORPHONE HCL 2 MG PO TABS
2.0000 mg | ORAL_TABLET | ORAL | Status: DC | PRN
  Administered 2019-03-02 – 2019-03-03 (×4): 2 mg via ORAL
  Filled 2019-03-02 (×5): qty 1

## 2019-03-02 MED ORDER — SIMETHICONE 80 MG PO CHEW
80.0000 mg | CHEWABLE_TABLET | Freq: Three times a day (TID) | ORAL | Status: DC
  Administered 2019-03-02 – 2019-03-05 (×9): 80 mg via ORAL
  Filled 2019-03-02 (×12): qty 1

## 2019-03-02 MED ORDER — DIPHENHYDRAMINE HCL 25 MG PO CAPS: 25.0000 mg | ORAL_CAPSULE | ORAL | Status: DC | PRN

## 2019-03-02 MED ORDER — METHYLERGONOVINE MALEATE 0.2 MG/ML IJ SOLN: 0.2000 mg | INTRAMUSCULAR | Status: DC | PRN

## 2019-03-02 MED ORDER — SODIUM CHLORIDE 0.9% FLUSH: 3.0000 mL | INTRAVENOUS | Status: DC | PRN

## 2019-03-02 MED ORDER — SODIUM CHLORIDE 0.9 % IV SOLN
INTRAVENOUS | Status: DC | PRN
  Administered 2019-03-02: 01:00:00 via INTRAVENOUS

## 2019-03-02 MED ORDER — HYDRALAZINE HCL 20 MG/ML IJ SOLN
10.0000 mg | INTRAMUSCULAR | Status: DC | PRN
  Administered 2019-03-02: 10 mg via INTRAVENOUS
  Filled 2019-03-02: qty 1

## 2019-03-02 MED ORDER — DIBUCAINE (PERIANAL) 1 % EX OINT: 1.0000 "application " | TOPICAL_OINTMENT | CUTANEOUS | Status: DC | PRN

## 2019-03-02 MED ORDER — TETANUS-DIPHTH-ACELL PERTUSSIS 5-2.5-18.5 LF-MCG/0.5 IM SUSP: 0.5000 mL | Freq: Once | INTRAMUSCULAR | Status: DC

## 2019-03-02 MED ORDER — STERILE WATER FOR IRRIGATION IR SOLN
Status: DC | PRN
  Administered 2019-03-02: 1

## 2019-03-02 MED ORDER — DEXTROSE 5 % IV SOLN
INTRAVENOUS | Status: DC | PRN
  Administered 2019-03-02: 3 g via INTRAVENOUS

## 2019-03-02 MED ORDER — KETOROLAC TROMETHAMINE 30 MG/ML IJ SOLN: 30.0000 mg | Freq: Once | INTRAMUSCULAR | Status: DC | PRN

## 2019-03-02 MED ORDER — NALOXONE HCL 4 MG/10ML IJ SOLN
1.0000 ug/kg/h | INTRAVENOUS | Status: DC | PRN
  Filled 2019-03-02: qty 5

## 2019-03-02 MED ORDER — LABETALOL HCL 200 MG PO TABS
300.0000 mg | ORAL_TABLET | Freq: Three times a day (TID) | ORAL | Status: DC
  Administered 2019-03-02 – 2019-03-05 (×11): 300 mg via ORAL
  Filled 2019-03-02 (×11): qty 1

## 2019-03-02 MED ORDER — SODIUM CHLORIDE 0.9 % IR SOLN
Status: DC | PRN
  Administered 2019-03-02: 1

## 2019-03-02 MED ORDER — LABETALOL HCL 5 MG/ML IV SOLN
INTRAVENOUS | Status: AC
  Filled 2019-03-02: qty 8

## 2019-03-02 MED ORDER — NALOXONE HCL 0.4 MG/ML IJ SOLN: 0.4000 mg | INTRAMUSCULAR | Status: DC | PRN

## 2019-03-02 MED ORDER — FUROSEMIDE 10 MG/ML IJ SOLN
INTRAMUSCULAR | Status: AC
  Filled 2019-03-02: qty 2

## 2019-03-02 MED ORDER — DEXAMETHASONE SODIUM PHOSPHATE 4 MG/ML IJ SOLN
INTRAMUSCULAR | Status: DC | PRN
  Administered 2019-03-02: 4 mg via INTRAVENOUS

## 2019-03-02 MED ORDER — MISOPROSTOL 200 MCG PO TABS
ORAL_TABLET | ORAL | Status: AC
  Filled 2019-03-02: qty 3

## 2019-03-02 MED ORDER — HYDRALAZINE HCL 20 MG/ML IJ SOLN
10.0000 mg | INTRAMUSCULAR | Status: DC | PRN
  Administered 2019-03-02: 10 mg via INTRAVENOUS

## 2019-03-02 MED ORDER — ONDANSETRON HCL 4 MG/2ML IJ SOLN
INTRAMUSCULAR | Status: AC
  Filled 2019-03-02: qty 2

## 2019-03-02 MED ORDER — WITCH HAZEL-GLYCERIN EX PADS: 1.0000 "application " | MEDICATED_PAD | CUTANEOUS | Status: DC | PRN

## 2019-03-02 MED ORDER — LABETALOL HCL 5 MG/ML IV SOLN
20.0000 mg | INTRAVENOUS | Status: DC | PRN
  Administered 2019-03-02: 20 mg via INTRAVENOUS

## 2019-03-02 MED ORDER — LABETALOL HCL 5 MG/ML IV SOLN: 40.0000 mg | INTRAVENOUS | Status: DC | PRN

## 2019-03-02 MED ORDER — LABETALOL HCL 5 MG/ML IV SOLN: 20.0000 mg | INTRAVENOUS | Status: DC | PRN

## 2019-03-02 MED ORDER — OXYCODONE HCL 5 MG PO TABS: 5.0000 mg | ORAL_TABLET | Freq: Once | ORAL | Status: DC | PRN

## 2019-03-02 MED ORDER — SIMETHICONE 80 MG PO CHEW
80.0000 mg | CHEWABLE_TABLET | ORAL | Status: DC | PRN
  Administered 2019-03-03: 80 mg via ORAL

## 2019-03-02 MED ORDER — FENTANYL CITRATE (PF) 100 MCG/2ML IJ SOLN
INTRAMUSCULAR | Status: DC | PRN
  Administered 2019-03-02: 15 ug via INTRATHECAL

## 2019-03-02 MED ORDER — HYDRALAZINE HCL 20 MG/ML IJ SOLN
INTRAMUSCULAR | Status: AC
  Filled 2019-03-02: qty 1

## 2019-03-02 MED ORDER — NIFEDIPINE ER OSMOTIC RELEASE 30 MG PO TB24
60.0000 mg | ORAL_TABLET | Freq: Two times a day (BID) | ORAL | Status: DC
  Administered 2019-03-02 – 2019-03-05 (×7): 60 mg via ORAL
  Filled 2019-03-02 (×8): qty 2

## 2019-03-02 MED ORDER — DIPHENHYDRAMINE HCL 25 MG PO CAPS: 25.0000 mg | ORAL_CAPSULE | Freq: Four times a day (QID) | ORAL | Status: DC | PRN

## 2019-03-02 MED ORDER — ALBUTEROL SULFATE (2.5 MG/3ML) 0.083% IN NEBU
3.0000 mL | INHALATION_SOLUTION | RESPIRATORY_TRACT | Status: DC | PRN
  Administered 2019-03-02: 3 mL via RESPIRATORY_TRACT

## 2019-03-02 MED ORDER — HYDROMORPHONE HCL 1 MG/ML IJ SOLN
0.2500 mg | INTRAMUSCULAR | Status: DC | PRN
  Administered 2019-03-02: 0.5 mg via INTRAVENOUS

## 2019-03-02 MED ORDER — METHYLERGONOVINE MALEATE 0.2 MG PO TABS: 0.2000 mg | ORAL_TABLET | ORAL | Status: DC | PRN

## 2019-03-02 MED ORDER — HYDROMORPHONE HCL 1 MG/ML IJ SOLN
INTRAMUSCULAR | Status: AC
  Filled 2019-03-02: qty 1

## 2019-03-02 MED ORDER — MISOPROSTOL 200 MCG PO TABS
ORAL_TABLET | ORAL | Status: AC
  Filled 2019-03-02: qty 1

## 2019-03-02 MED ORDER — DIPHENHYDRAMINE HCL 50 MG/ML IJ SOLN: 12.5000 mg | INTRAMUSCULAR | Status: DC | PRN

## 2019-03-02 MED ORDER — OXYTOCIN 40 UNITS IN NORMAL SALINE INFUSION - SIMPLE MED
INTRAVENOUS | Status: DC | PRN
  Administered 2019-03-02: 40 [IU] via INTRAVENOUS

## 2019-03-02 MED ORDER — OXYTOCIN 40 UNITS IN NORMAL SALINE INFUSION - SIMPLE MED: 2.5000 [IU]/h | INTRAVENOUS | Status: AC

## 2019-03-02 MED ORDER — FUROSEMIDE 10 MG/ML IJ SOLN
20.0000 mg | INTRAMUSCULAR | Status: AC
  Administered 2019-03-02: 20 mg via INTRAVENOUS

## 2019-03-02 MED ORDER — ALBUTEROL SULFATE (2.5 MG/3ML) 0.083% IN NEBU
INHALATION_SOLUTION | RESPIRATORY_TRACT | Status: AC
  Filled 2019-03-02: qty 3

## 2019-03-02 MED ORDER — PHENYLEPHRINE HCL-NACL 20-0.9 MG/250ML-% IV SOLN
INTRAVENOUS | Status: DC | PRN
  Administered 2019-03-02: 30 ug/min via INTRAVENOUS

## 2019-03-02 MED ORDER — IBUPROFEN 800 MG PO TABS
800.0000 mg | ORAL_TABLET | Freq: Three times a day (TID) | ORAL | Status: AC
  Administered 2019-03-02 – 2019-03-04 (×8): 800 mg via ORAL
  Filled 2019-03-02 (×9): qty 1

## 2019-03-02 MED ORDER — MISOPROSTOL 200 MCG PO TABS
800.0000 ug | ORAL_TABLET | Freq: Once | ORAL | Status: AC
  Administered 2019-03-02: 800 ug via RECTAL

## 2019-03-02 MED ORDER — OXYCODONE HCL 5 MG/5ML PO SOLN: 5.0000 mg | Freq: Once | ORAL | Status: DC | PRN

## 2019-03-02 MED ORDER — HYDROCHLOROTHIAZIDE 12.5 MG PO CAPS
12.5000 mg | ORAL_CAPSULE | Freq: Every day | ORAL | Status: AC
  Administered 2019-03-02 – 2019-03-03 (×2): 12.5 mg via ORAL
  Filled 2019-03-02 (×3): qty 1

## 2019-03-02 MED ORDER — SCOPOLAMINE 1 MG/3DAYS TD PT72
1.0000 | MEDICATED_PATCH | Freq: Once | TRANSDERMAL | Status: DC
  Filled 2019-03-02: qty 1

## 2019-03-02 MED ORDER — SIMETHICONE 80 MG PO CHEW
80.0000 mg | CHEWABLE_TABLET | ORAL | Status: DC
  Administered 2019-03-03 – 2019-03-04 (×3): 80 mg via ORAL
  Filled 2019-03-02 (×3): qty 1

## 2019-03-02 MED ORDER — SENNOSIDES-DOCUSATE SODIUM 8.6-50 MG PO TABS
2.0000 | ORAL_TABLET | ORAL | Status: DC
  Administered 2019-03-03 – 2019-03-04 (×2): 2 via ORAL
  Filled 2019-03-02 (×2): qty 2

## 2019-03-02 MED ORDER — MAGNESIUM SULFATE 40 GM/1000ML IV SOLN
INTRAVENOUS | Status: AC
  Filled 2019-03-02: qty 1000

## 2019-03-02 MED ORDER — DEXTROSE 5 % IV SOLN
INTRAVENOUS | Status: AC
  Filled 2019-03-02: qty 3000

## 2019-03-02 MED ORDER — MAGNESIUM SULFATE 40 GM/1000ML IV SOLN
2.0000 g/h | INTRAVENOUS | Status: AC
  Administered 2019-03-02: 2 g/h via INTRAVENOUS
  Filled 2019-03-02: qty 1000

## 2019-03-02 SURGICAL SUPPLY — 41 items
BENZOIN TINCTURE PRP APPL 2/3 (GAUZE/BANDAGES/DRESSINGS) ×3 IMPLANT
CHLORAPREP W/TINT 26ML (MISCELLANEOUS) ×3 IMPLANT
CLAMP CORD UMBIL (MISCELLANEOUS) IMPLANT
CLOSURE WOUND 1/2 X4 (GAUZE/BANDAGES/DRESSINGS) ×1
CLOTH BEACON ORANGE TIMEOUT ST (SAFETY) ×3 IMPLANT
DRAPE C SECTION CLR SCREEN (DRAPES) ×3 IMPLANT
DRSG OPSITE POSTOP 4X10 (GAUZE/BANDAGES/DRESSINGS) ×3 IMPLANT
ELECT REM PT RETURN 9FT ADLT (ELECTROSURGICAL) ×3
ELECTRODE REM PT RTRN 9FT ADLT (ELECTROSURGICAL) ×1 IMPLANT
EXTRACTOR VACUUM KIWI (MISCELLANEOUS) IMPLANT
GAUZE SPONGE 4X4 12PLY STRL LF (GAUZE/BANDAGES/DRESSINGS) ×6 IMPLANT
GLOVE BIO SURGEON STRL SZ 6.5 (GLOVE) ×2 IMPLANT
GLOVE BIO SURGEONS STRL SZ 6.5 (GLOVE) ×1
GLOVE BIOGEL PI IND STRL 7.0 (GLOVE) ×2 IMPLANT
GLOVE BIOGEL PI INDICATOR 7.0 (GLOVE) ×4
GOWN STRL REUS W/TWL LRG LVL3 (GOWN DISPOSABLE) ×6 IMPLANT
HOVERMATT SINGLE USE (MISCELLANEOUS) ×3 IMPLANT
KIT ABG SYR 3ML LUER SLIP (SYRINGE) IMPLANT
NEEDLE HYPO 25X5/8 SAFETYGLIDE (NEEDLE) IMPLANT
NS IRRIG 1000ML POUR BTL (IV SOLUTION) ×3 IMPLANT
PACK C SECTION WH (CUSTOM PROCEDURE TRAY) ×3 IMPLANT
PAD ABD 7.5X8 STRL (GAUZE/BANDAGES/DRESSINGS) ×3 IMPLANT
PAD OB MATERNITY 4.3X12.25 (PERSONAL CARE ITEMS) ×3 IMPLANT
RETAINER VISCERAL (MISCELLANEOUS) ×3 IMPLANT
RETRACTOR WND ALEXIS 25 LRG (MISCELLANEOUS) ×1 IMPLANT
RTRCTR C-SECT PINK 25CM LRG (MISCELLANEOUS) IMPLANT
RTRCTR WOUND ALEXIS 25CM LRG (MISCELLANEOUS) ×3
STRIP CLOSURE SKIN 1/2X4 (GAUZE/BANDAGES/DRESSINGS) ×2 IMPLANT
SUT CHROMIC 1 CTX 36 (SUTURE) ×6 IMPLANT
SUT PLAIN 0 NONE (SUTURE) IMPLANT
SUT PLAIN 2 0 XLH (SUTURE) ×3 IMPLANT
SUT VIC AB 0 CT1 27 (SUTURE) ×4
SUT VIC AB 0 CT1 27XBRD ANBCTR (SUTURE) ×2 IMPLANT
SUT VIC AB 2-0 CT1 27 (SUTURE) ×2
SUT VIC AB 2-0 CT1 TAPERPNT 27 (SUTURE) ×1 IMPLANT
SUT VIC AB 3-0 CT1 27 (SUTURE)
SUT VIC AB 3-0 CT1 TAPERPNT 27 (SUTURE) IMPLANT
SUT VIC AB 4-0 KS 27 (SUTURE) ×3 IMPLANT
TOWEL OR 17X24 6PK STRL BLUE (TOWEL DISPOSABLE) ×3 IMPLANT
TRAY FOLEY W/BAG SLVR 14FR LF (SET/KITS/TRAYS/PACK) ×3 IMPLANT
WATER STERILE IRR 1000ML POUR (IV SOLUTION) ×3 IMPLANT

## 2019-03-02 NOTE — Progress Notes (Signed)
Dr Terri Piedra called with CXR results, mild cardiomegaly and mild interstitial edema. Last BP 149/97. Neb treatment given, sats holding at 98% RA. Orders given for 13m IV Lasix now. Will continue to monitor.

## 2019-03-02 NOTE — Transfer of Care (Signed)
Immediate Anesthesia Transfer of Care Note  Patient: Remas Sobel Sharp  Procedure(s) Performed: CESAREAN SECTION (N/A )  Patient Location: PACU  Anesthesia Type:Spinal  Level of Consciousness: awake, alert  and patient cooperative  Airway & Oxygen Therapy: Patient Spontanous Breathing  Post-op Assessment: Report given to RN and Post -op Vital signs reviewed and stable  Post vital signs: Reviewed and stable  Last Vitals:  Vitals Value Taken Time  BP 143/90 03/02/19 0202  Temp    Pulse 72 03/02/19 0206  Resp 16 03/02/19 0206  SpO2 97 % 03/02/19 0206  Vitals shown include unvalidated device data.  Last Pain:  Vitals:   03/01/19 2353  TempSrc: Oral  PainSc:       Patients Stated Pain Goal: 3 (60/04/59 9774)  Complications: No apparent anesthesia complications

## 2019-03-02 NOTE — Progress Notes (Signed)
Pt up to chair with 1 assist. Pt states she was starting to feel dizzy and lightheaded and did not feel comfortable with ambulating yet. Pt placed back in bed without issues. Pt condition improving. MD aware.

## 2019-03-02 NOTE — Progress Notes (Signed)
Patient ID: Victoria George, female   DOB: October 04, 1975, 43 y.o.   MRN: [id number]  Chart check   VS- 128-140/69-80  Plan : Routine pp/post op care           Continue on MgSO4 till 250am 12/7           Continue on procardia 30xl bid and labetalol 348m po bid

## 2019-03-02 NOTE — Progress Notes (Signed)
Sitting

## 2019-03-02 NOTE — Plan of Care (Signed)
Patient s/p c-section and returns on Magnesium drip.Blood pressures remain elevated and she will continue on her po antihypertensive medication as before.

## 2019-03-02 NOTE — Progress Notes (Signed)
Patient ID: KIARIA QUINNELL, female   DOB: [birth year], 43 y.o.   MRN: [id number] Late entry  Pt had elevated BP at 845pm. BP responded to one dose of IV labetalol. Pt remained asymptomatic. Given parameters for delivery discussed earlier in day, advised pt of need to proceed to delivery.   Pt last ate ( chipotle) at 6pm.   Given BP responded to IV medication; plan to proceed with delivery 6hrs after last meal  Will proceed to OR now   Risks and benefits of delivery via c/s reviewed and all questions answered

## 2019-03-02 NOTE — Progress Notes (Signed)
Standing

## 2019-03-02 NOTE — Anesthesia Procedure Notes (Signed)
Spinal  Patient location during procedure: OB Start time: 03/02/2019 12:20 AM End time: 03/02/2019 12:27 AM Staffing Anesthesiologist: Barnet Glasgow, MD Performed: anesthesiologist  Preanesthetic Checklist Completed: patient identified, surgical consent, pre-op evaluation, timeout performed, IV checked, risks and benefits discussed and monitors and equipment checked Spinal Block Patient position: sitting Prep: site prepped and draped and DuraPrep Patient monitoring: heart rate, cardiac monitor, continuous pulse ox and blood pressure Approach: midline Location: L2-3 Injection technique: single-shot Needle Needle type: Pencan  Needle gauge: 24 G Needle length: 10 cm Needle insertion depth: 7 cm Assessment Sensory level: T4 Additional Notes  1 Attempt (s). Pt tolerated procedure well.

## 2019-03-02 NOTE — Anesthesia Preprocedure Evaluation (Signed)
Anesthesia Evaluation  Patient identified by MRN, date of birth, ID band Patient awake  General Assessment Comment:Pt last ate at 1730  Reviewed: Allergy & Precautions, NPO status , Patient's Chart, lab work & pertinent test results  Airway Mallampati: II  TM Distance: >3 FB Neck ROM: Full  Mouth opening: Limited Mouth Opening  Dental no notable dental hx. (+) Teeth Intact, Dental Advisory Given   Pulmonary asthma ,    Pulmonary exam normal breath sounds clear to auscultation       Cardiovascular hypertension, Pt. on medications Normal cardiovascular exam Rhythm:Regular Rate:Normal  Chronic HTN Pre eclampsia   Neuro/Psych negative neurological ROS  negative psych ROS   GI/Hepatic negative GI ROS, AST 54 ALT 50   Endo/Other  negative endocrine ROS  Renal/GU negative Renal ROS     Musculoskeletal negative musculoskeletal ROS (+)   Abdominal (+) + obese,   Peds  Hematology Hgb 10.9 Plt 265   Anesthesia Other Findings   Reproductive/Obstetrics (+) Pregnancy                             Anesthesia Physical Anesthesia Plan  ASA: III and emergent  Anesthesia Plan: Spinal   Post-op Pain Management:    Induction:   PONV Risk Score and Plan: 3 and Treatment may vary due to age or medical condition, Dexamethasone and Ondansetron  Airway Management Planned: Nasal Cannula and Natural Airway  Additional Equipment: None  Intra-op Plan:   Post-operative Plan:   Informed Consent: I have reviewed the patients History and Physical, chart, labs and discussed the procedure including the risks, benefits and alternatives for the proposed anesthesia with the patient or authorized representative who has indicated his/her understanding and acceptance.     Dental advisory given  Plan Discussed with: CRNA  Anesthesia Plan Comments: (58 2/7 wk G1P0 w hx of Chronic HTN, Pre-eclampsia for primary  c/s under spinal)        Anesthesia Quick Evaluation

## 2019-03-02 NOTE — Op Note (Signed)
Operative Note    Preoperative Diagnosis: IUP at 32 3/7 wks                                              Worsening preeclampsia                                             History of myomectomy                                             Breech infant   Postoperative Diagnosis: Same                                               Subserosal and submucosal fibroids                                                Uterine atony   Procedure: Primary low transverse cesarean section with double layered closure   Surgeon: Mickle Mallory DO Scrub: Christina L  Anesthesia: Spinal  Fluids: LR  2526m EBL: 604mUOP: 10041m Findings: Viable female infant initially presenting with left arm in tranverse/oblique lie then rotated to frank breech for delivery. Apgars and weight pending NICU  Grossly normal tubes and ovaries. Multiple subserosal fibroids - largest approximately 3cm; one submucosal fibroid palpated    Specimen: Placenta to pathology; Cord blood and gas   Procedure Note Patient was taken to the operating room where spinal anesthesia was administered.  She was placed in the dorsal supine position with a leftward tilt. A foley was placed  in a sterile manner to drain the bladder.  Pt was prepped and draped in the usual sterile fashion. An appropriate time out was performed. Allis clamp test confirmed adequate anesthesia. A Pfannenstiel skin incision was then made with the scalpel and carried through to the underlying layer of fascia by sharp dissection and Bovie cautery. The fascia was nicked in the midline and the incision was extended laterally with Mayo scissors. The superior, then inferior, aspects of the incision were grasped with Coker clamps and dissected off the underlying rectus muscles. Rectus muscles were separated in the midline and the peritoneal cavity entered bluntly. The peritoneal incision was then extended both superiorly and inferiorly with careful attention to avoid both  bowel and bladder. The Alexis self-retaining wound retractor was then placed within the incision and the lower uterine segment exposed.  The lower uterine segment was then incised in a transverse fashion and the cavity itself entered bluntly.The infant was noted to be in transverse but oblique lie with fetal head in maternal left upper quadrant. Attempt was made to rotate fetal head to vertex with arm returned into uterus however infant rotated to frank breech instead. Infant was therefore delivered in frank breech position to the level of the scapula. The legs were flexed and delivered and then the arms  were delivered next across infants chest. The vertex was then flexed and delivered next.. The cord was quickly clamped and cut. The infant was handed off to the waiting NICU team. The placenta was then spontaneously expressed from the uterus. It was noted to be small but intact with some calcifications. The uterus was then cleared of all clots and debris with moist lap sponge. The uterine incision was then repaired in 2 layers:  the first layer was a running locked layer 0 chromic and the second an imbricating layer of the same suture. The tubes and ovaries were inspected and the gutters cleared of all clots and debris. Uterus was noted to have multiple fibroids as noted in findings. The uterine incision was inspected again and found to be hemostatic. All instruments and sponges as well as the Alexis retractor were then removed from the abdomen. The  peritoneum was then sutured in a running fashion with the rectus muscles incorporated. The fascia was then closed with 0 Vicryl in a running fashion. Subcutaneous tissue was reapproximated with 3-0 plain in a running fashion. The skin was closed with a subcuticular stitch of 4-0 Vicryl on a Keith needle and then reinforced with benzoin and Steri-Strips and a pressure dressing. 824mg of cytotec was placed rectally to effect better uterine tone. At the conclusion of the  procedure all instruments and sponge counts were correct. Patient was taken to the recovery room in good condition with her baby accompanying her skin to skin.

## 2019-03-02 NOTE — Progress Notes (Addendum)
Patient ID: Victoria George, female   DOB: [birth year], 43 y.o.   MRN: [id number]  Pt had decreased O2 sats postpartum to low 90s. Reported felt well but fatigued and acknowledged wheezing BP elevated to 170s/100s but pt denied any HA or blurry vision  Plan: Started pt on MgSO4 - 4gm bolus to start          Completed IV labetalol protocol then started hydralazine                protocol with good response          Resume BP meds: labetalol 379m BID/procardia 60 BID          Obtain CXR ; getting breathing treatment as well now          Check labs: CBC, CMP, COAGS          Routine post op/postpartum care

## 2019-03-02 NOTE — Lactation Note (Signed)
This note was copied from a baby's chart. Lactation Consultation Note  Patient Name: Victoria George [id number] Date: 03/02/2019 Reason for consult: Initial assessment;NICU baby;Preterm <34wks;Infant < 6lbs;Primapara P1 Baby was delivered at 32.3 weeks and is in the NICU.  IVF pregnancy with donor egg.  Instructed on breast massage and hand expression.  Colostrum easily expressed from both breast and 2 mls collected in colostrum container.  Breasts are large and nipples everted.  Symphony pump initiated with instructions on use, cleaning and EBM storage.  Assisted with pumping using 24 mm flanges.  Mom pumped for 15 minutes with drops obtained on right but none on left.  Mom has a Medela breast pump at home.  Instructed to pump and hand express every 3 hours.  Questions answered.  Breastfeeding consultation services and Providing Breastmilk For Your Baby In NICU book given.  Maternal Data Has patient been taught Hand Expression?: Yes Does the patient have breastfeeding experience prior to this delivery?: No  Feeding    LATCH Score                   Interventions Interventions: DEBP  Lactation Tools Discussed/Used Pump Review: Setup, frequency, and cleaning;Milk Storage Initiated by:: [email or web address] Date initiated:: 03/02/19   Consult Status Consult Status: Follow-up Date: 03/03/19 Follow-up type: In-patient    Ave Filter 03/02/2019, 9:30 AM

## 2019-03-02 NOTE — Progress Notes (Signed)
Patient ID: Victoria George, female   DOB: 03-06-1976, 43 y.o.   MRN: [id number] Pt doing well. Fatigued but otherwise no HA or blurry vision reported. Breathing improved  VS - 141-147/90-91, 77          UOP 1241m in past 5hrs  CXR - mild cardiomegaly and interstitial                 edema  GEN - NAD, mild general edema ABD - dressing c/d/i EXT - no homans, scds in place  14.9>11.3<300, ALT 54, AST 53  A/P: POD#1 s/p primary c/s for preE and due to history of myomectomy, breech - Started on MgSO4 at 250am today              continue for 24hrs  - Received one dose of lasixs at 430am - Continue on procardia 30xl BID and labetalol 3028mpo BID - Routine pp/post op care

## 2019-03-02 NOTE — Anesthesia Postprocedure Evaluation (Signed)
Anesthesia Post Note  Patient: Victoria George  Procedure(s) Performed: CESAREAN SECTION (N/A )     Patient location during evaluation: Mother Baby Anesthesia Type: Spinal Level of consciousness: oriented and awake and alert Pain management: pain level controlled Vital Signs Assessment: post-procedure vital signs reviewed and stable Respiratory status: spontaneous breathing and respiratory function stable Cardiovascular status: blood pressure returned to baseline and stable Postop Assessment: no headache, no backache, no apparent nausea or vomiting and able to ambulate Anesthetic complications: no    Last Vitals:  Vitals:   03/02/19 0203 03/02/19 0204  BP: (!) 143/90   Pulse:  67  Resp:  (!) 9  Temp: 36.5 C   SpO2:  98%    Last Pain:  Vitals:   03/02/19 0203  TempSrc: Oral  PainSc:    Pain Goal: Patients Stated Pain Goal: 3 (02/28/19 1045)                 Barnet Glasgow

## 2019-03-03 ENCOUNTER — Encounter (HOSPITAL_COMMUNITY): Payer: Self-pay | Admitting: *Deleted

## 2019-03-03 NOTE — Lactation Note (Signed)
This note was copied from a baby's chart. Lactation Consultation Note  Patient Name: Victoria George [id number] Date: 03/03/2019 Reason for consult: Follow-up assessment   LC Follow Up Visit:  Mother was in the NICU holding her baby on her chest.  She had no immediate questions/concerns related to pumping.  She did state that her breasts are a little bit swollen.  She had a Cesarean section and I reassured her that extra fluid around the nipple/areola is common.  Discussed RPS and hand expression before/after pumping.  Informed her that she could call me for a visual inspection if she desires when she returns to her room.  Baby was sleeping comfortably, but not doing STS, so I did not observe at this time.  Encouraged mother to keep pumping every three hours.  She has some colostrum in the NICU refrigerator for use.  Praised her for her diligence in pumping and providing EBM.  Emotional support given.  Mother will call as needed for any questions/concerns.   Consult Status Consult Status: Follow-up Date: 03/04/19 Follow-up type: In-patient    Little Ishikawa 03/03/2019, 3:33 PM

## 2019-03-03 NOTE — Lactation Note (Signed)
This note was copied from a baby's chart. Lactation Consultation Note  Patient Name: Victoria George [name] Date: 03/03/2019 Reason for consult: Follow-up assessment   LC Follow Up Visit:  Attempted to visit with mother, however, she was not in her room.  I will visit NICU to see if she is with her baby.     Consult Status Consult Status: Follow-up Date: 03/04/19 Follow-up type: In-patient    Little Ishikawa 03/03/2019, 3:23 PM

## 2019-03-03 NOTE — Lactation Note (Signed)
This note was copied from a baby's chart. Lactation Consultation Note  Patient Name: Victoria George [id number] Date: 03/03/2019  [facility] visit attempted, but Mom appeared to be sleeping.   Matthias Hughs [facility] 03/03/2019, 2:12 PM

## 2019-03-03 NOTE — Clinical Social Work Maternal (Signed)
CLINICAL SOCIAL WORK MATERNAL/CHILD NOTE  Patient Details  Name: Victoria George MRN: [id number] Date of Birth: April 26, 1975  Date:  03/03/2019  Clinical Social Worker Initiating Note:  Laurey Arrow Date/Time: Initiated:  03/03/19/1054     Child's Name:  Lynnea Maizes Niblett   Biological Parents:  Mother, Father   Need for Interpreter:  None   Reason for Referral:      Address:  [address] 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   Education:  Production designer, theatre/television/film   Homebound arranged:    Museum/gallery curator Resources:  Multimedia programmer   Other Resources:      Cultural/Religious Considerations Which May Impact Care:  Per MOB's Face Sheet, MOB is Holiness.   Strengths:  Ability to meet basic needs , Pediatrician chosen   Psychotropic Medications:         Pediatrician:    [place]  Pediatrician List:   [facility]  [place]      Pediatrician Fax Number:    Risk Factors/Current Problems:  None   Cognitive State:  Alert , Able to Concentrate , Insightful , Linear Thinking , Goal Oriented    Mood/Affect:  Happy , Interested , Comfortable , Anxious    CSW Assessment: CSW met with MOB in room 107 to complete an assessment for NICU admission.  MOB was pleasant and receptive to CSW intervention.  She reports feeling  well today and seems to have a good understanding of baby's medical situation at this time as she was able to update CSW on her status.    CSW asked MOB to share her story of labor and delivery as well as baby's admission to NICU and how she felt emotionally throughout her experience.  MOB was open to talking with CSW and sharing her feelings.  She states baby's admission to NICU was "bittersweet" because she didn't want to be away from her baby, but "knew she'd get the proper care there (NICU)."  MOB is keeping this (necessary care baby is receiving) as her focus and acknowledges the situation as temporary.  CSW assisted her in identifying strengths, which she was able to.  She is pleased with baby's progress.  MOB shared her first experience with holding infant and reported feeling nervous. CSW validated and normalized MOB's thoughts and feelings. MOB also reported that MOB has "Tried for many years to become a mom and I just feel overwhelmed with excitement." CSW provided education regarding PPD signs and symptoms to watch for and asked that MOB commit to talking with CSW and or her doctor if  symptoms arise at any time.  She agreed.  CSW also discussed common emotions often experienced during the first two weeks after delivery, keeping in mind the separation that is inevitably caused by baby's admission to NICU.  MOB denies any hx of depression. MOB states she has a good support system and names FOB/husband as her main support person.   MOB reports having all needed baby supplies at home and has chosen a pediatrician.  She states no issues with transportation.  She states no further questions, concerns or needs at this time.  CSW explained ongoing support services offered by NICU CSW and provided contact information.  MOB seemed very appreciative of the visit and thanked CSW.  CSW Plan/Description:  Psychosocial Support and Ongoing Assessment of Needs, Sudden Infant Death Syndrome (SIDS) Education,  Perinatal Mood and Anxiety Disorder (PMADs) Education, Other Patient/Family Education   Laurey Arrow, MSW, LCSW Clinical Social Work [phone]  Dimple Nanas, LCSW 03/03/2019, 11:07 AM

## 2019-03-03 NOTE — Progress Notes (Signed)
Pt taken to NICU via wheelchair

## 2019-03-03 NOTE — Progress Notes (Signed)
POSTPARTUM POSTOP PROGRESS NOTE  POD #2  Subjective:  No acute events overnight.  Pt denies problems with ambulating, voiding or po intake.  She denies nausea or vomiting.  Pain is well controlled.  She has had flatus. She has not had bowel movement.  Lochia Small. Desires abdominal binder, Denies PreE symptoms including SOB, states medicine from yesterday helped  Objective: Blood pressure 135/72, pulse 85, temperature 98.4 F (36.9 C), temperature source Oral, resp. rate 17, height 5' 2"  (1.575 m), weight 115.2 kg, SpO2 100 %, unknown if currently breastfeeding.  Physical Exam:  General: alert, cooperative and no distress Lochia:normal flow Chest: CTAB but limited 2/2 habitus Heart: RRR no m/r/g Abdomen: +BS, soft, nontender Uterine Fundus: firm, 1cm below umbilicus. Honeycomb dressing intact, no drainage Extremities: trace edema, neg calf TTP BL, neg Homans BL  Recent Labs    03/02/19 0407 03/02/19 0621  HGB 11.3* 11.3*  HCT 33.0* 33.4*   UOP 2180 over past 24hrs BP 138-142/72-80 Assessment/Plan:  A/P: POD#2 s/p primary c/s for preE and due to history of myomectomy, breech - S/p MgSO4 0250 - Received one dose of lasixs at 430am 12/6, wheezing significantly improved, continued diuresis    *Reviewed incentive spirometry this AM - Continue on procardia 30xl BID and labetalol 366m po TID - Routine pp/post op care   Baby girl in NICU, will likely room in if possible     LOS: 12 days

## 2019-03-04 LAB — SURGICAL PATHOLOGY

## 2019-03-04 NOTE — Lactation Note (Signed)
This note was copied from a baby's chart. Lactation Consultation Note  Patient Name: Victoria George [id number] Date: 03/04/2019 Reason for consult: Follow-up assessment;Preterm <34wks;Infant < 6lbs;Primapara;1st time breastfeeding;NICU baby   LC Follow Up Visit:  P1 mother whose infant is now 39 hours old.  This is a preterm baby weighing < 6 lbs and in the NICU.  Mother was resting when I arrived.  She had visited baby this morning.  She commented on how she is so tired.  Provided information concerning factors that are making her feel so tired.  Mother verbalized understanding.  Encouraged continued rest when she is not visiting in the NICU and to ask the RN for a "quiet sign" to be placed on her door as needed for short intervals of rest.  Mother verbalized understanding.  Mother was concerned yesterday about some "swelling" around her nipples/areolas.  Upon assessment this a.m. I do not find any swelling to this area.  Her breasts are soft and non tender and nipples are everted and intact.  Discussed mother's fluid intake and also the extra fluid she has been receiving via IV administration.    Mother verbalized she is not seeing much colostrum yet and I encouraged continued hand expression before/after pumping and to be diligent about pumping every 2 1/2-3 hours.  Mother aware.  Emotional support given and mother was happy to report that her daughter is doing well in the NICU.  She will call as needed for further questions/concerns.   Consult Status Consult Status: Follow-up Date: 03/05/19 Follow-up type: In-patient    Victoria George 03/04/2019, 12:12 PM

## 2019-03-04 NOTE — Progress Notes (Signed)
Subjective: Postpartum Day 2: Cesarean Delivery/preeclampsia Patient reports tolerating PO and no problems voiding.  Reports no HA's and feels well.  Working on pumping and baby stable in NICU.  Objective: Vital signs in last 24 hours: Temp:  [98 F (36.7 C)-99 F (37.2 C)] 98 F (36.7 C) (12/08 0510) Pulse Rate:  [82-97] 97 (12/08 0510) Resp:  [17-18] 18 (12/08 0510) BP: (117-134)/(60-76) 125/72 (12/08 0510) SpO2:  [98 %-100 %] 99 % (12/08 0510)  Physical Exam:  General: alert and cooperative Lochia: appropriate Uterine Fundus: firm Incision: C/D/I DVT Evaluation: LE edema  Recent Labs    03/02/19 0407 03/02/19 0621  HGB 11.3* 11.3*  HCT 33.0* 33.4*    Assessment/Plan: Status post Cesarean section. Doing well postoperatively.  BP stable on labetalol 224m po TID and procardia 6106mpo BID Probable d/c tomorrow  KaLogan Bores2/10/2018, 10:43 AM

## 2019-03-05 MED ORDER — HYDROMORPHONE HCL 2 MG PO TABS: 2.0000 mg | ORAL_TABLET | ORAL | 0 refills | Status: DC | PRN

## 2019-03-05 MED ORDER — FUROSEMIDE 10 MG/ML IJ SOLN
20.0000 mg | Freq: Once | INTRAMUSCULAR | Status: AC
  Administered 2019-03-05: 20 mg via INTRAVENOUS
  Filled 2019-03-05: qty 2

## 2019-03-05 MED ORDER — PRENATAL VITAMINS 28-0.8 MG PO TABS: 1.0000 | ORAL_TABLET | Freq: Every morning | ORAL | 3 refills | Status: DC

## 2019-03-05 MED ORDER — NIFEDIPINE ER OSMOTIC RELEASE 60 MG PO TB24: 60.0000 mg | ORAL_TABLET | Freq: Two times a day (BID) | ORAL | 2 refills | Status: DC

## 2019-03-05 MED ORDER — IBUPROFEN 800 MG PO TABS: 800.0000 mg | ORAL_TABLET | Freq: Three times a day (TID) | ORAL | 1 refills | Status: DC

## 2019-03-05 MED ORDER — LABETALOL HCL 300 MG PO TABS: 300.0000 mg | ORAL_TABLET | Freq: Three times a day (TID) | ORAL | 2 refills | Status: DC

## 2019-03-05 NOTE — Progress Notes (Signed)
Patient ID: Victoria George, female   DOB: 08-09-1975, 43 y.o.   MRN: [id number]  Pt's BP controlled thru day.  Desires d/c home (May room in in NICU)  Reviewed w patient need to take BP meds twice daily, reviewed pain medicine Will f/u in office in 1,2 and 6 weeks.    Voices understanding

## 2019-03-05 NOTE — Discharge Summary (Signed)
OB Discharge Summary     Patient Name: Victoria George DOB: [birth year] MRN: [id number]  Date of admission: 02/19/2019 Delivering MD: Carlynn Purl [facility]   Date of discharge: 03/05/2019  Admitting diagnosis: 34WKS EVAL BP Intrauterine pregnancy: [redacted]w[redacted]d    Secondary diagnosis:  Active Problems:   Chronic hypertension with superimposed preeclampsia   Status post primary low transverse cesarean section   Uterine fibroids affecting pregnancy in third trimester  Additional problems: N/A     Discharge diagnosis: Preterm Pregnancy Delivered                                                                                                Post partum procedures:N/A  Augmentation: LTCS no augmentation  Complications: None  Hospital course:  Sceduled C/S   43y.o. yo G1P0101 at 45w3das admitted to the hospital 02/19/2019 for scheduled cesarean section with the following indication:PreEclampsia.  Membrane Rupture Time/Date: 12:50 AM ,03/02/2019   Patient delivered a Viable infant.03/02/2019  Details of operation can be found in separate operative note.  Pateint had an uncomplicated postpartum course.  She is ambulating, tolerating a regular diet, passing flatus, and urinating well. Patient is discharged home in stable condition on  03/05/19         Physical exam  Vitals:   03/05/19 0536 03/05/19 0814 03/05/19 1215 03/05/19 1710  BP: (!) 142/75 136/75 (!) 149/81   Pulse: 97 94 94   Resp: 20 18 18    Temp: 98 F (36.7 C) 98.5 F (36.9 C) 98.4 F (36.9 C) 98.5 F (36.9 C)  TempSrc: Oral Oral Oral Oral  SpO2: 100% 99% 100%   Weight:      Height:       General: alert and no distress Lochia: appropriate Uterine Fundus: firm Incision: Healing well with no significant drainage DVT Evaluation: No evidence of DVT seen on physical exam. Labs: Lab Results  Component Value Date   WBC 14.9 (H) 03/02/2019   HGB 11.3 (L) 03/02/2019   HCT 33.4 (L) 03/02/2019   MCV 92.3 03/02/2019   PLT 300 03/02/2019   CMP Latest Ref Rng & Units 03/02/2019  Glucose 70 - 99 mg/dL 116(H)  BUN 6 - 20 mg/dL 9  Creatinine 0.44 - 1.00 mg/dL 0.76  Sodium 135 - 145 mmol/L 136  Potassium 3.5 - 5.1 mmol/L 4.0  Chloride 98 - 111 mmol/L 109  CO2 22 - 32 mmol/L 20(L)  Calcium 8.9 - 10.3 mg/dL 8.4(L)  Total Protein 6.5 - 8.1 g/dL 5.7(L)  Total Bilirubin 0.3 - 1.2 mg/dL 0.4  Alkaline Phos 38 - 126 U/L 103  AST 15 - 41 U/L 53(H)  ALT 0 - 44 U/L 54(H)    Discharge instruction: per After Visit Summary and "Baby and Me Booklet".  After visit meds:  Allergies as of 03/05/2019      Reactions   Phenergan [promethazine] Shortness Of Breath, Palpitations   Shellfish Allergy Anaphylaxis   ALL SHELLFISH   Codeine Hives   Milk-related Compounds Itching, Swelling   All dairy products   Other Hives, Swelling   Oranges,  pineapples   Oxycodone Other (See Comments)   Percocet-- "insomnia"   Tramadol Other (See Comments)   Ultram-- " manic episode"   Hydrocodone Hives, Itching   vicodin      Medication List    STOP taking these medications   aspirin EC 81 MG tablet   Blood Pressure Monitor Kit     TAKE these medications   albuterol 108 (90 Base) MCG/ACT inhaler Commonly known as: VENTOLIN HFA Inhale 2 puffs into the lungs every 6 (six) hours as needed for wheezing or shortness of breath.   budesonide-formoterol 80-4.5 MCG/ACT inhaler Commonly known as: SYMBICORT Inhale 2 puffs into the lungs every morning. What changed:   when to take this  reasons to take this   calcium carbonate 500 MG chewable tablet Commonly known as: TUMS - dosed in mg elemental calcium Chew 1 tablet by mouth as needed for indigestion or heartburn.   EPINEPHrine 0.3 mg/0.3 mL Soaj injection Commonly known as: EPI-PEN Inject into the muscle once.   HYDROmorphone 2 MG tablet Commonly known as: DILAUDID Take 1 tablet (2 mg total) by mouth every 4 (four) hours as needed for severe pain.   ibuprofen 800  MG tablet Commonly known as: ADVIL Take 1 tablet (800 mg total) by mouth every 8 (eight) hours.   labetalol 300 MG tablet Commonly known as: NORMODYNE Take 1 tablet (300 mg total) by mouth every 8 (eight) hours. What changed:   medication strength  how much to take  when to take this   NIFEdipine 60 MG 24 hr tablet Commonly known as: PROCARDIA XL/NIFEDICAL XL Take 1 tablet (60 mg total) by mouth 2 (two) times daily.   Prenatal Vitamins 28-0.8 MG Tabs Take 1 tablet by mouth every morning. What changed:   medication strength  how much to take  when to take this       Diet: routine diet  Activity: Advance as tolerated. Pelvic rest for 6 weeks.   Outpatient follow up:1,2 and 6 weeks Follow up Appt:No future appointments. Follow up Visit:No follow-ups on file.  Postpartum contraception: Undecided  Newborn Data: Live born female  Birth Weight: 3 lb 1.4 oz (1400 g) APGAR: 5, 9  Newborn Delivery   Birth date/time: 03/02/2019 00:53:00 Delivery type: C-Section, Low Transverse Trial of labor: No C-section categorization: Primary      Baby Feeding: Bottle and Breast Disposition:NICU   03/05/2019 Janyth Contes, MD

## 2019-03-05 NOTE — Progress Notes (Signed)
3 Days Post-Op Procedure(s) (LRB): CESAREAN SECTION (N/A)  Subjective: Patient reports incisional pain, tolerating PO and no problems voiding.  Pain controlled, ambulating    Objective: I have reviewed patient's vital signs, intake and output, medications and labs.  General: alert and no distress Resp: clear to auscultation bilaterally Cardio: regular rate and rhythm GI: soft, non-tender; bowel sounds normal; no masses,  no organomegaly Extremities: sym, NT, +2 edema Inc: C/D/I  Assessment: s/p Procedure(s): CESAREAN SECTION (N/A): stable and progressing well  Plan: Encourage ambulation, tol po, ambulating, voiding Discharge home - with Motrin, percocet and PNV Baby in NICU D/c with labetalol 225m bid, Procardia 60 bid Poss d/c this PM, Dose of lasix BP check 1 week, incision check 2 weeks, full postpartum check 6 wk  LOS: 14 days    Victoria George 03/05/2019, 8:20 AM

## 2019-03-05 NOTE — Lactation Note (Signed)
This note was copied from a baby's chart. Lactation Consultation Note  Patient Name: Victoria George Date: 03/05/2019   Attempted to visit with mother and she is not in her room at this time.       Maternal Data    Feeding Feeding Type: Donor Breast Milk  LATCH Score                   Interventions    Lactation Tools Discussed/Used     Consult Status      Carlye Grippe 03/05/2019, 9:02 AM

## 2019-03-05 NOTE — Progress Notes (Signed)
Discharge instructions given to patient. Discussed med changes including dosage times of procardia, labetalol, ibuprofen, dilaudid and prenatal vitamin. Reviewed signs and symptoms of hypertension. Discussed postpartum care. Patient verbalized understanding.

## 2019-03-06 ENCOUNTER — Ambulatory Visit: Payer: Self-pay

## 2019-03-06 NOTE — Lactation Note (Signed)
This note was copied from a baby's chart. Lactation Consultation Note  Patient Name: Victoria George [id number] Date: 03/06/2019   Baby in NICU 56 days old.  [redacted] week GA. Mother received a new Medela pump and style max flow and is worried because she did not express as much as she does with Medela Symphony. Assisted mother with flange size.  24 seems to work better than 21 flange.  Although nipples are small, 24 flange produces more milk. Rotated flanges to vertical oval. Reviewed hands on pumping to increase flow, suggest she think about baby "Trinity"and milk started flowing.  Mother excited. Recommend pumping q 2-3 hours during the day and q 4 hours at night. Mother has hands free pumping bra.  Praised her for her efforts.      Maternal Data    Feeding Feeding Type: Breast Milk  LATCH Score                   Interventions    Lactation Tools Discussed/Used     Consult Status      Victoria George 03/06/2019, 1:30 PM

## 2019-03-07 ENCOUNTER — Ambulatory Visit: Payer: Self-pay

## 2019-03-07 NOTE — Lactation Note (Signed)
This note was copied from a baby's chart. Lactation Consultation Note  Patient Name: Victoria George [id number] Date: 03/07/2019 Reason for consult: Follow-up assessment;NICU baby;Preterm <34wks;Mother's request;Infant < 6lbs  Visited with mom of a 85 days old pre-term NICU female < 4 lbs; she called lactation because she's still having problems with her new Medela pump and style max flow and is worried because she did not express as much as she does with Medela Symphony. Mom acknowledged that lactation came yesterday and helped her but she said she's not sure if the difference in yield is because of the pump or because she's not pumping every 3 hours.  Explained to mom the importance of consistent pumping, especially when there is infant separation (such a NICU stay) where baby is not sucking/latching to the breast. Reviewed supply & demand, lactogenesis II, pump rentals and other ways to get a Symphony pump if mom ends up buying one. She told LC she saw the symphony pump on her insurance website and she believes she can get it. She told LC she won't qualify for the [facility] program if it's income bases, she doesn't meet the requirements.  LC also explained to mom the milk supply tends to dwindle throughout the day and that she may want to try her pump (after troubleshooting) at home first thing in the morning to see if there is a difference with the hospital Symphony pump. Mom holding baby on her chest but not doing STS. Reviewed the benefits of STS care and also how it would help to impact in her supply, mom getting about 20 ml per pumping session with Symphony pump and only 10 ml with  Medela pump and style max flow.  Feeding plan:  1. Encouraged mom to start pumping consistently, every 3 hours, at least 8 pumping sessions/24 hours  2. She'll troubleshoot her Medela pump and style max flow at home and compare the yield with the Symphony pump around the same time of the day (AM vs AM; PM vs PM) 3.  She'll check with her insurance company/gift shop for a pump rental if she decides to go with the Symphony pump  Mom reported al questions and concerns were answered, she's aware of [place] OP services and will call PRN.   Maternal Data    Feeding Feeding Type: Donor Breast Milk  LATCH Score                   Interventions Interventions: Breast feeding basics reviewed;DEBP  Lactation Tools Discussed/Used Tools: Pump Breast pump type: Double-Electric Breast Pump   Consult Status Consult Status: PRN Follow-up type: In-patient    Braylynn Lewing Francene Boyers 03/07/2019, 2:39 PM

## 2019-03-10 ENCOUNTER — Ambulatory Visit: Payer: Self-pay

## 2019-03-10 NOTE — Lactation Note (Signed)
This note was copied from a baby's chart. Lactation Consultation Note  Patient Name: Victoria George [id number] Date: 03/10/2019   Visited with mom of an 73 day old pre-term NICU female < 6 lbs, mom told LC that she has ordered a new DEBP from her insurance company, the [employer]; she wasn't happy with the Medela Pump & Style Max Flow. However after checking the amounts of EBM that she turned in Vs what she was getting on the Symphony DEBP, mom realized that the "difference" might have just been a perception due to the different sizes bottles between her personal pump and the hospital grade pump. Mom doing STS with baby when entering the room; praised her for her efforts.  The bottles on her personal pump are much bigger than the bottles on the Symphony at the hospital. Mom told LC that when she pumps here she gets about 20-40 ml per pumping session but at home is "less". When LC checked the Medela bags that she brought from home, noticed that the line was at 60 ml of EBM even though the bags were lined "flat" to squeeze the air from it; it might have been less.  Mom also told LC that she has not been pumping bilaterally, but that she's been pumping religiously every 3 hours, 15 minutes off one breast and then immediately after that 15 minutes on the other breast. Advised mom to do bilateral pumping instead but she said she's tried before and that didn't work for her, "it doesn't feel right" were her exact words. Discussed with mom the benefits of bilateral pumping, and also power pumping.  Feeding plan:  1. Encouraged mom to start power pumping for at least a week, every morning, in addition to her other 7 regular pumping sessions (8 pumping sessions/24 hours including power pumping in the AM) 2. She'll try her new [employer]meda Finesse once it gets in the mail, and will report to [facility] how it's working out  Mom reported al questions and concerns were answered, she's aware of [place] OP services and will  call PRN.   Maternal Data    Feeding Feeding Type: Breast Milk  LATCH Score                   Interventions    Lactation Tools Discussed/Used     Consult Status      Victoria George 03/10/2019, 12:34 PM

## 2019-03-21 ENCOUNTER — Ambulatory Visit: Payer: Self-pay

## 2019-03-21 NOTE — Lactation Note (Signed)
This note was copied from a baby's chart. Lactation Consultation Note  Patient Name: Victoria George WOEHO'Z Date: 03/21/2019 Reason for consult: Follow-up assessment;NICU baby;Preterm <34wks  [id number] Asked to visit mom by NICU RN  Mom states pumping is going great and she is very pleased with her breastfeeding/pumping experience. Mom states she has noticed in the last few sessions her right breast produces as much as half an ounce more than her left. Reassured mom that asymetrical volume production is completely normal. Mom denies any pain or any other concerns with pumping.  Encouraged to call prn with any issues.  Interventions Interventions: DEBP    Consult Status Consult Status: PRN Follow-up type: In-patient    Cranston Neighbor 03/21/2019, 3:19 PM

## 2019-03-29 ENCOUNTER — Ambulatory Visit: Payer: Self-pay

## 2019-03-29 NOTE — Lactation Note (Signed)
This note was copied from a baby's chart. Lactation Consultation Note  Patient Name: Victoria George [id number] Date: 03/29/2019 Reason for consult: Follow-up assessment;NICU baby;Late-preterm 34-36.6wks;Infant < 6lbs;Primapara;1st time breastfeeding  LC in to visit with P47 Mom of 22 week old baby in the NICU.  AGA [redacted]w[redacted]d  Mom is pumping regularly and noticing more milk from right breast than left.  Talked about normal variations between breasts.  Mom denies any symptoms of plugged ducts.  Mom using heat and massage.  Reassured Mom that expressing 2 oz and 1-1.5 oz from each breast is WNL.  Mom knows to continue to ask for assistance prn.  Consult Status Consult Status: PRN Date: 03/29/19 Follow-up type: In-patient    SBroadus John1/04/2019, 12:02 PM

## 2019-04-03 ENCOUNTER — Ambulatory Visit: Payer: Self-pay

## 2019-04-03 ENCOUNTER — Inpatient Hospital Stay (HOSPITAL_COMMUNITY): Admit: 2019-04-03 | Payer: PRIVATE HEALTH INSURANCE | Admitting: Obstetrics and Gynecology

## 2019-04-03 NOTE — Lactation Note (Signed)
This note was copied from a baby's chart. Lactation Consultation Note  Patient Name: Victoria George QQUIV'H Date: 04/03/2019 Reason for consult: Follow-up assessment;Mother's request;Infant < 6lbs;Primapara;1st time breastfeeding;NICU baby;Preterm <34wks  Visited with mom of a 3 weeks old, she requested lactation due to breast pain. Mom has very large breast and she told LC that her pain started about 2 weeks ago, mom also noted that she got her period for the first time. Mom's breast felt soft, but she said she had some "knots" that she couldn't get rid of and they're the ones causing her the pain. LC did some deep tissue massage, and along with [facility] student Victoria George were able to resolve those knots with some light/medium pressure.  LC also did some hand expression and milk was flowing easily, mom voiced that the pain was gone, but she did feel a little "tender" to touch because of all the breast massage. She's using her Medela DEBP, she's pumping every 4 hours, about 6 times/24 hours and she's getting about 3.5 ounces combined per pumping session. She told LC that when she pumps every 3 hours, she doesn't get as much. Reviewed supply and demand, pumping schedule, power pumping, engorgement prevention/treatment, plug ducts and mastitis.  Feeding plan:  1. Mom will continue pumping every 4 hours, but pumping every 3 was recommended 2. She'll do breast massage at the end of her pumping sessions to assure her breasts drained completely and if knots are still present she'll pump again for 5 minutes to fully empty her breasts  Mom reported all questions and concerns were answered, she's aware of [place] OP services and will call PRN.   Maternal Data    Feeding Feeding Type: Breast Milk Nipple Type: Dr. Myra Gianotti Preemie  LATCH Score                   Interventions Interventions: Breast feeding basics reviewed;Breast massage;Hand express;Breast compression  Lactation Tools  Discussed/Used     Consult Status Consult Status: PRN Follow-up type: In-patient    Victoria George Francene Boyers 04/03/2019, 4:23 PM

## 2019-04-07 ENCOUNTER — Ambulatory Visit: Payer: Self-pay

## 2019-04-07 NOTE — Lactation Note (Signed)
This note was copied from a baby's chart. Lactation Consultation Note:  Infant  Is 41 weeks old. Staff nurse Jaquelyn Bitter reports that mother has some questions of [place].  Mother reports that she is exclusively pumping. She reports that she is now pumping every 3 hours . She reports that she feels that she is making less than when pumping every 4 hours.  Mother advised to try and get at least 8 pumpings in in 24 hours. Mother advised to try power pumping again pumping both breast at the same time.  Encouraged mother to do hand express and breast massage before and after pumping.  Mothers questions were about the best pump to get. She had questions about the [place] and the [place]. Mother was advised to phone the pump company and talk to representative to compare.   Mother committed to pumping and is pumping consistently. Mother will page as needed.   Patient Name: Victoria George [id number] Date: 04/07/2019 Reason for consult: Follow-up assessment;NICU baby   Maternal Data    Feeding Feeding Type: Bottle Fed - Breast Milk  LATCH Score                   Interventions Interventions: Breast massage;Hand express;DEBP  Lactation Tools Discussed/Used     Consult Status Consult Status: PRN    Darla Lesches 04/07/2019, 2:11 PM

## 2019-09-12 ENCOUNTER — Ambulatory Visit: Payer: PRIVATE HEALTH INSURANCE | Attending: Internal Medicine

## 2019-09-12 DIAGNOSIS — Z23 Encounter for immunization: Secondary | ICD-10-CM

## 2019-09-12 NOTE — Progress Notes (Signed)
   Covid-19 Vaccination Clinic  Name:  Victoria George    MRN: [id number] DOB: 1975/07/24  09/12/2019  [name] was observed post Covid-19 immunization for 15 minutes without incident. She was provided with Vaccine Information Sheet and instruction to access the V-Safe system.   [name] was instructed to call 911 with any severe reactions post vaccine: Marland Kitchen Difficulty breathing  . Swelling of face and throat  . A fast heartbeat  . A bad rash all over body  . Dizziness and weakness   Immunizations Administered    Name Date Dose VIS Date Route   Pfizer COVID-19 Vaccine 09/12/2019 11:35 AM 0.3 mL 05/21/2018 Intramuscular   Manufacturer: [place]   Lot: [id number]   [place]: [id number]

## 2019-10-03 ENCOUNTER — Ambulatory Visit: Payer: Medicaid Other | Attending: Internal Medicine

## 2019-10-03 DIAGNOSIS — Z23 Encounter for immunization: Secondary | ICD-10-CM

## 2019-10-03 NOTE — Progress Notes (Signed)
   Covid-19 Vaccination Clinic  Name:  Victoria George    MRN: [id number] DOB: Jul 08, 1975  10/03/2019  [name] was observed post Covid-19 immunization for 15 minutes without incident. She was provided with Vaccine Information Sheet and instruction to access the V-Safe system.   [name] was instructed to call 911 with any severe reactions post vaccine: Marland Kitchen Difficulty breathing  . Swelling of face and throat  . A fast heartbeat  . A bad rash all over body  . Dizziness and weakness   Immunizations Administered    Name Date Dose VIS Date Route   Pfizer COVID-19 Vaccine 10/03/2019 11:35 AM 0.3 mL 05/21/2018 Intramuscular   Manufacturer: [place]   Lot: [id number]   [place]: [id number]

## 2020-03-11 ENCOUNTER — Other Ambulatory Visit: Payer: Self-pay | Admitting: Obstetrics and Gynecology

## 2020-03-11 DIAGNOSIS — Z1231 Encounter for screening mammogram for malignant neoplasm of breast: Secondary | ICD-10-CM

## 2020-04-21 ENCOUNTER — Inpatient Hospital Stay: Admission: RE | Admit: 2020-04-21 | Payer: Medicaid Other | Source: Ambulatory Visit

## 2020-04-26 ENCOUNTER — Other Ambulatory Visit: Payer: Self-pay | Admitting: Nurse Practitioner

## 2020-04-26 DIAGNOSIS — Z1231 Encounter for screening mammogram for malignant neoplasm of breast: Secondary | ICD-10-CM

## 2020-04-28 ENCOUNTER — Ambulatory Visit
Admission: RE | Admit: 2020-04-28 | Discharge: 2020-04-28 | Disposition: A | Discharge status: Home / Self Care | Payer: PRIVATE HEALTH INSURANCE | Source: Ambulatory Visit

## 2020-04-28 ENCOUNTER — Other Ambulatory Visit: Payer: Self-pay

## 2020-04-28 DIAGNOSIS — Z1231 Encounter for screening mammogram for malignant neoplasm of breast: Secondary | ICD-10-CM

## 2021-03-27 DIAGNOSIS — Z8639 Personal history of other endocrine, nutritional and metabolic disease: Secondary | ICD-10-CM

## 2021-03-27 HISTORY — DX: Personal history of other endocrine, nutritional and metabolic disease: Z86.39

## 2021-03-31 ENCOUNTER — Other Ambulatory Visit: Payer: Self-pay

## 2021-03-31 ENCOUNTER — Emergency Department
Admission: EM | Admit: 2021-03-31 | Discharge: 2021-03-31 | Disposition: A | Discharge status: Home / Self Care | Payer: PRIVATE HEALTH INSURANCE | Attending: Emergency Medicine | Admitting: Emergency Medicine

## 2021-03-31 ENCOUNTER — Emergency Department: Payer: PRIVATE HEALTH INSURANCE

## 2021-03-31 DIAGNOSIS — E876 Hypokalemia: Secondary | ICD-10-CM

## 2021-03-31 DIAGNOSIS — I10 Essential (primary) hypertension: Secondary | ICD-10-CM | POA: Diagnosis not present

## 2021-03-31 DIAGNOSIS — R0602 Shortness of breath: Secondary | ICD-10-CM

## 2021-03-31 DIAGNOSIS — J4540 Moderate persistent asthma, uncomplicated: Secondary | ICD-10-CM | POA: Diagnosis not present

## 2021-03-31 DIAGNOSIS — R42 Dizziness and giddiness: Secondary | ICD-10-CM

## 2021-03-31 DIAGNOSIS — N9489 Other specified conditions associated with female genital organs and menstrual cycle: Secondary | ICD-10-CM | POA: Insufficient documentation

## 2021-03-31 LAB — URINALYSIS, ROUTINE W REFLEX MICROSCOPIC
Bacteria, UA: NONE SEEN
Bilirubin Urine: NEGATIVE
Glucose, UA: NEGATIVE mg/dL
Hgb urine dipstick: NEGATIVE
Ketones, ur: NEGATIVE mg/dL
Leukocytes,Ua: NEGATIVE
Nitrite: NEGATIVE
Protein, ur: 30 mg/dL — AB
Specific Gravity, Urine: 1.025 (ref 1.005–1.030)
pH: 6 (ref 5.0–8.0)

## 2021-03-31 LAB — CBC
HCT: 37.6 % (ref 36.0–46.0)
Hemoglobin: 12.5 g/dL (ref 12.0–15.0)
MCH: 30 pg (ref 26.0–34.0)
MCHC: 33.2 g/dL (ref 30.0–36.0)
MCV: 90.4 fL (ref 80.0–100.0)
Platelets: 242 10*3/uL (ref 150–400)
RBC: 4.16 MIL/uL (ref 3.87–5.11)
RDW: 13.2 % (ref 11.5–15.5)
WBC: 9 10*3/uL (ref 4.0–10.5)
nRBC: 0 % (ref 0.0–0.2)

## 2021-03-31 LAB — BASIC METABOLIC PANEL
Anion gap: 8 (ref 5–15)
BUN: 14 mg/dL (ref 6–20)
CO2: 29 mmol/L (ref 22–32)
Calcium: 8.7 mg/dL — ABNORMAL LOW (ref 8.9–10.3)
Chloride: 98 mmol/L (ref 98–111)
Creatinine, Ser: 0.93 mg/dL (ref 0.44–1.00)
GFR, Estimated: >60 mL/min (ref >60)
Glucose, Bld: 99 mg/dL (ref 70–99)
Potassium: 2.7 mmol/L — CL (ref 3.5–5.1)
Sodium: 135 mmol/L (ref 135–145)

## 2021-03-31 LAB — MAGNESIUM: Magnesium: 1.9 mg/dL (ref 1.7–2.4)

## 2021-03-31 LAB — TROPONIN I (HIGH SENSITIVITY): Troponin I (High Sensitivity): 4 ng/L (ref <18)

## 2021-03-31 LAB — POC URINE PREG, ED: Preg Test, Ur: NEGATIVE

## 2021-03-31 LAB — HCG, QUANTITATIVE, PREGNANCY: hCG, Beta Chain, Quant, S: 2 m[IU]/mL (ref <5)

## 2021-03-31 MED ORDER — POTASSIUM CHLORIDE 10 MEQ/100ML IV SOLN
10.0000 meq | INTRAVENOUS | Status: AC
  Administered 2021-03-31 (×2): 10 meq via INTRAVENOUS
  Filled 2021-03-31 (×2): qty 100

## 2021-03-31 MED ORDER — POTASSIUM CHLORIDE CRYS ER 20 MEQ PO TBCR: 20.0000 meq | EXTENDED_RELEASE_TABLET | Freq: Two times a day (BID) | ORAL | 0 refills | Status: DC

## 2021-03-31 MED ORDER — POTASSIUM CHLORIDE CRYS ER 20 MEQ PO TBCR
40.0000 meq | EXTENDED_RELEASE_TABLET | Freq: Once | ORAL | Status: AC
  Administered 2021-03-31: 40 meq via ORAL
  Filled 2021-03-31: qty 2

## 2021-03-31 MED ORDER — SODIUM CHLORIDE 0.9 % IV BOLUS
1000.0000 mL | Freq: Once | INTRAVENOUS | Status: AC
  Administered 2021-03-31: 1000 mL via INTRAVENOUS

## 2021-03-31 MED ORDER — MECLIZINE HCL 25 MG PO TABS: 25.0000 mg | ORAL_TABLET | Freq: Three times a day (TID) | ORAL | 0 refills | Status: DC | PRN

## 2021-03-31 MED ORDER — MECLIZINE HCL 25 MG PO TABS
25.0000 mg | ORAL_TABLET | Freq: Once | ORAL | Status: AC
  Administered 2021-03-31: 25 mg via ORAL
  Filled 2021-03-31: qty 1

## 2021-03-31 NOTE — Discharge Instructions (Signed)
Please take the potassium supplementation for the next 5 days.  Please have your potassium rechecked in about 1 week by your primary doctor.  Your dizziness was likely caused by vertigo which could be from an inner ear problem.  You can take the meclizine if your symptoms return.  If you develop chest pain shortness of breath or feel you are going to pass out again, please return to the emergency department.

## 2021-03-31 NOTE — ED Notes (Signed)
Was able to ambulate with walker

## 2021-03-31 NOTE — ED Provider Notes (Signed)
[facility] Provider Note    Event Date/Time   First MD Initiated Contact with Patient 03/31/21 1030     (approximate)   History   Dizziness and Shortness of Breath   HPI  Vaishali Baise Salih is a 46 y.o. female with past medical history of hypertension, anemia, asthma who presents with dizziness and syncope.  Patient was in her normal state of health when she woke up this morning sat up in bed and felt very dizzy/lightheaded had a syncopal episode.  She is not able to get up on her own and since then has had ongoing feeling of room spinning.  She is also had multiple episodes of palpitations associated with dyspnea and nausea.  Prior to the presyncopal episode she had palpitations and dyspnea but denies any associated chest pain.  While in the lobby had several episodes of palpitations again but no additional syncopal episodes.  Denies any diplopia dysarthria numbness weakness.  Does have mild headache.  Denies any nausea vomiting diarrhea or poor p.o. intake.    Past Medical History:  Diagnosis Date   Anemia 03/02/2011   History of gastritis    12/ 2012   History of ovarian cyst    HTN (hypertension) 03/02/2011   Hypertension    Iron deficiency anemia    Moderate persistent asthma, uncomplicated    [facility]-- dr Nelva Bush   Seasonal allergic conjunctivitis    Seasonal and perennial allergic rhinitis    Status post primary low transverse cesarean section 03/02/2019   Submucous leiomyoma of uterus    Uterine fibroids affecting pregnancy in third trimester 03/02/2019   Wears glasses     Patient Active Problem List   Diagnosis Date Noted   Status post primary low transverse cesarean section 03/02/2019   Uterine fibroids affecting pregnancy in third trimester 03/02/2019   Chronic hypertension with superimposed preeclampsia 02/08/2019   Uterine fibroids affecting pregnancy 10/16/2018   Obesity affecting pregnancy, antepartum 10/06/2018   In  vitro fertilization 10/05/2018   Chronic hypertension affecting pregnancy 10/03/2018   Supervision of high risk pregnancy, antepartum 10/03/2018   Seasonal and perennial allergic rhinitis 05/03/2017   Moderate persistent asthma without complication 38/25/0539   Seasonal allergic conjunctivitis 03/01/2017   Anaphylactic shock due to adverse food reaction 03/01/2017   Chronic urticaria 03/01/2017   Nausea and vomiting during pregnancy 03/02/2011     Physical Exam  Triage Vital Signs: ED Triage Vitals [03/31/21 0846]  Enc Vitals Group     BP 134/89     Pulse Rate 100     Resp 16     Temp 98.1 F (36.7 C)     Temp Source Oral     SpO2 100 %     Weight 218 lb (98.9 kg)     Height 5' 1"  (1.549 m)     Head Circumference      Peak Flow      Pain Score      Pain Loc      Pain Edu?      Excl. in [place]?     Most recent vital signs: Vitals:   03/31/21 1216 03/31/21 1514  BP: 129/86 121/82  Pulse: 91 90  Resp: 14 20  Temp: 98 F (36.7 C) 98 F (36.7 C)  SpO2: 100% 100%     General: Awake, patient appears uncomfortable, prefers to keep her eyes closed CV:  Good peripheral perfusion.  Resp:  Normal effort.  No tachypnea or increased  work of breathing Abd:  No distention.  Neuro:             Awake, Alert, Oriented x 3  Aox3, nml speech  PERRL, EOMI, face symmetric, nml tongue movement  5/5 strength in the BL upper and lower extremities  Sensation grossly intact in the BL upper and lower extremities  Finger-nose-finger intact BL     ED Results / Procedures / Treatments  Labsall labs ordered are listed, but only abnormal results are displayed) Labs Reviewed  BASIC METABOLIC PANEL - Abnormal; Notable for the following components:      Result Value   Potassium 2.7 (*)    Calcium 8.7 (*)    All other components within normal limits  CBC  MAGNESIUM  HCG, QUANTITATIVE, PREGNANCY  URINALYSIS, ROUTINE W REFLEX MICROSCOPIC  POC URINE PREG, ED  TROPONIN I (HIGH SENSITIVITY)      EKG  EKG interpreted by myself, normal sinus rhythm, prolonged QT interval, nonspecific ST flattening, no acute ischemic changes   RADIOLOGY I reviewed the CT scan of the brain which does not show any acute intracranial process; agree with radiology report   I reviewed the CXR which does not show any acute cardiopulmonary process; agree with radiology report     PROCEDURES:  Critical Care performed: No  Procedures  The patient is on the cardiac monitor to evaluate for evidence of arrhythmia and/or significant heart rate changes.   MEDICATIONS ORDERED IN ED: Medications  potassium chloride 10 mEq in 100 mL IVPB (0 mEq Intravenous Stopped 03/31/21 1455)  potassium chloride SA (KLOR-CON M) CR tablet 40 mEq (40 mEq Oral Given 03/31/21 1121)  sodium chloride 0.9 % bolus 1,000 mL (0 mLs Intravenous Stopped 03/31/21 1455)  meclizine (ANTIVERT) tablet 25 mg (25 mg Oral Given 03/31/21 1121)     IMPRESSION / MDM / ASSESSMENT AND PLAN / ED COURSE  I reviewed the triage vital signs and the nursing notes.                              Differential diagnosis includes, but is not limited to, dysrhythmia, electrolyte abnormality, vertigo, posterior stroke  The patient is a 46 year old female who presents with an episode of presyncope and dizziness.  Symptoms started this morning when she sat up in bed and felt a room spinning feeling and subsequently fell to the ground.  She says she did not lose consciousness so unclear if this was presyncope versus just losing her balance secondary to being dizzy.  She does describe preceding palpitations and some dyspnea which she is also had while in the lobby.  Denies any chest pain or dyspnea at the time of my evaluation.  On exam she does look somewhat uncomfortable prefers to keep her eyes closed and the dizziness is seems to be exacerbated by eye movement.  She has a nonfocal neurologic exam, no nystagmus and no dysmetria.  When attempting to walk she  feels very symptomatic with standing although does not have any frank ataxia but we were unable to walk her because of her symptoms.  Differential is broad, somewhat unclear if this is syncope versus vertigo.  Her labs are notable for hypokalemia with a potassium 2.7, I reviewed the EKG and although EKG QTC is under 500 appears to be more prolonged than that, greater than half the RR interval.  This does make me question whether that she had a dysrhythmia secondary to the hypokalemia  including torsade.  We will supplement her potassium orally and with IV and check a magnesium.  We will also send troponin.  Plan to get a chest x-ray and CT head.  Will treat with meclizine to see if this helps her with her dizziness.  After meclizine patient feeling significantly improved.  She is no longer dizzy.  Able to ambulate to the bathroom without issue.  CT head is negative.  She received 40 of IV potassium and 40 p.o. potassium.  Magnesium is normal and troponin is negative.  Given she is so much clinically improved after meclizine and she has a normal CT head is not able to ambulate do not feel that she needs any further evaluation for stroke.  Arrhythmia is certainly still possible.  Potassium is now supplemented which could have been the cause of this, given the potentially reversible cause I do think she is appropriate for outpatient management.  She is also not had any additional palpitations since being in the emergency department.  Will discharge with 5 days of p.o. potassium supplementation.  I stressed the importance of primary care follow-up in about 1 week to have a recheck.      FINAL CLINICAL IMPRESSION(S) / ED DIAGNOSES   Final diagnoses:  Hypokalemia  Vertigo     Rx / DC Orders   ED Discharge Orders          Ordered    potassium chloride SA (KLOR-CON M) 20 MEQ tablet  2 times daily        03/31/21 1536    meclizine (ANTIVERT) 25 MG tablet  3 times daily PRN        03/31/21 1536              Note:  This document was prepared using Dragon voice recognition software and may include unintentional dictation errors.   Rada Hay, MD 03/31/21 [phone]

## 2021-03-31 NOTE — ED Triage Notes (Signed)
Pt comes into the ED via EMS from home with c/o SOB and dizziness today  133/80 97HR 100%RA [id number]

## 2021-03-31 NOTE — ED Notes (Signed)
Pt seen walking with Evelena Peat, NT with a walker. Pt able to ambulate with minimal assistance. Pt returned to room with no issue.

## 2021-04-26 DIAGNOSIS — Z1231 Encounter for screening mammogram for malignant neoplasm of breast: Secondary | ICD-10-CM

## 2021-05-02 ENCOUNTER — Other Ambulatory Visit: Payer: Self-pay | Admitting: Nurse Practitioner

## 2021-05-02 DIAGNOSIS — Z1231 Encounter for screening mammogram for malignant neoplasm of breast: Secondary | ICD-10-CM

## 2021-05-09 ENCOUNTER — Ambulatory Visit
Admission: RE | Admit: 2021-05-09 | Discharge: 2021-05-09 | Disposition: A | Discharge status: Home / Self Care | Payer: PRIVATE HEALTH INSURANCE | Source: Ambulatory Visit

## 2021-05-09 ENCOUNTER — Other Ambulatory Visit: Payer: Self-pay

## 2021-05-09 DIAGNOSIS — Z1231 Encounter for screening mammogram for malignant neoplasm of breast: Secondary | ICD-10-CM

## 2021-09-24 DIAGNOSIS — E876 Hypokalemia: Secondary | ICD-10-CM

## 2021-09-24 HISTORY — DX: Hypokalemia: E87.6

## 2021-10-09 ENCOUNTER — Other Ambulatory Visit: Payer: Self-pay

## 2021-10-09 ENCOUNTER — Emergency Department (HOSPITAL_BASED_OUTPATIENT_CLINIC_OR_DEPARTMENT_OTHER)
Admission: EM | Admit: 2021-10-09 | Discharge: 2021-10-09 | Disposition: A | Discharge status: Home / Self Care | Payer: PRIVATE HEALTH INSURANCE | Attending: Emergency Medicine | Admitting: Emergency Medicine

## 2021-10-09 ENCOUNTER — Encounter (HOSPITAL_BASED_OUTPATIENT_CLINIC_OR_DEPARTMENT_OTHER): Payer: Self-pay | Admitting: Emergency Medicine

## 2021-10-09 DIAGNOSIS — R002 Palpitations: Secondary | ICD-10-CM | POA: Diagnosis present

## 2021-10-09 DIAGNOSIS — I10 Essential (primary) hypertension: Secondary | ICD-10-CM | POA: Insufficient documentation

## 2021-10-09 DIAGNOSIS — R55 Syncope and collapse: Secondary | ICD-10-CM | POA: Diagnosis not present

## 2021-10-09 DIAGNOSIS — J45909 Unspecified asthma, uncomplicated: Secondary | ICD-10-CM | POA: Diagnosis not present

## 2021-10-09 HISTORY — DX: Hypokalemia: E87.6

## 2021-10-09 LAB — CBC
HCT: 41.6 % (ref 36.0–46.0)
Hemoglobin: 13.4 g/dL (ref 12.0–15.0)
MCH: 29.6 pg (ref 26.0–34.0)
MCHC: 32.2 g/dL (ref 30.0–36.0)
MCV: 91.8 fL (ref 80.0–100.0)
Platelets: 341 10*3/uL (ref 150–400)
RBC: 4.53 MIL/uL (ref 3.87–5.11)
RDW: 13.4 % (ref 11.5–15.5)
WBC: 6.8 10*3/uL (ref 4.0–10.5)
nRBC: 0 % (ref 0.0–0.2)

## 2021-10-09 LAB — BASIC METABOLIC PANEL
Anion gap: 6 (ref 5–15)
BUN: 12 mg/dL (ref 6–20)
CO2: 30 mmol/L (ref 22–32)
Calcium: 10.2 mg/dL (ref 8.9–10.3)
Chloride: 101 mmol/L (ref 98–111)
Creatinine, Ser: 0.88 mg/dL (ref 0.44–1.00)
GFR, Estimated: >60 mL/min (ref >60)
Glucose, Bld: 97 mg/dL (ref 70–99)
Potassium: 3.4 mmol/L — ABNORMAL LOW (ref 3.5–5.1)
Sodium: 137 mmol/L (ref 135–145)

## 2021-10-09 LAB — PREGNANCY, URINE: Preg Test, Ur: NEGATIVE

## 2021-10-09 LAB — URINALYSIS, ROUTINE W REFLEX MICROSCOPIC
Bilirubin Urine: NEGATIVE
Glucose, UA: NEGATIVE mg/dL
Hgb urine dipstick: NEGATIVE
Ketones, ur: NEGATIVE mg/dL
Leukocytes,Ua: NEGATIVE
Nitrite: NEGATIVE
Protein, ur: NEGATIVE mg/dL
Specific Gravity, Urine: 1.009 (ref 1.005–1.030)
pH: 7.5 (ref 5.0–8.0)

## 2021-10-09 MED ORDER — POTASSIUM CHLORIDE CRYS ER 20 MEQ PO TBCR
40.0000 meq | EXTENDED_RELEASE_TABLET | Freq: Two times a day (BID) | ORAL | Status: DC
  Administered 2021-10-09: 40 meq via ORAL
  Filled 2021-10-09: qty 2

## 2021-10-09 MED ORDER — POTASSIUM CHLORIDE CRYS ER 20 MEQ PO TBCR
40.0000 meq | EXTENDED_RELEASE_TABLET | Freq: Two times a day (BID) | ORAL | Status: DC
  Filled 2021-10-09: qty 2

## 2021-10-09 NOTE — ED Provider Notes (Signed)
[facility] EMERGENCY DEPT Provider Note   CSN: [id number] Arrival date & time: 10/09/21  1027     History  Chief Complaint  Patient presents with   Palpitations    Victoria George is a 46 y.o. female.  HPI     46 year old female with a history of hypertension, gastritis, ovarian cyst, anemia, hypokalemia, asthma, presents with concern for near syncope and palpitations.  Last night got out of shower and having same lightheadedness, palpitatoins Did not have syncope Drank an electrolyte drink last night This morning heart was still having palpitations Felt irregular, fast, felt like couldn't catch breath Nausea last night, no vomiting This AM would come and go palpitations HR has not been irregular since being in the ED, just a lightheadedness No shortness of breath now NO diarrhea or black or bloody stools No new medications, didn't take anything this AM  No fevers Did have some headache last night and day before No vaginal discharge In January was having similar symptoms of palpitations, lightheadedness, had syncope, came to ED and potassium was 2.7  Saw Dr, was 3.3, was taking OTC potassium and rx from doctor but not sure if same time  Was having increased urination, pressure, sometimes had to go and nothing came out  Last night felt hot, this morning felt hot  Was not referred to Cardiologist, was on k at the time  Past Medical History:  Diagnosis Date   Anemia 03/02/2011   History of gastritis    12/ 2012   History of ovarian cyst    HTN (hypertension) 03/02/2011   Hypertension    Iron deficiency anemia    Low serum potassium level    Moderate persistent asthma, uncomplicated    [facility] & [facility] center-- dr Nelva Bush   Seasonal allergic conjunctivitis    Seasonal and perennial allergic rhinitis    Status post primary low transverse cesarean section 03/02/2019   Submucous leiomyoma of uterus    Uterine fibroids affecting pregnancy in  third trimester 03/02/2019   Wears glasses     Past Surgical History:  Procedure Laterality Date   CARDIOVASCULAR STRESS TEST     CESAREAN SECTION N/A 03/02/2019   Procedure: CESAREAN SECTION;  Surgeon: Sherlyn Hay, DO;  Location: [facility];  Service: Obstetrics;  Laterality: N/A;   CHOLECYSTECTOMY  02/01/2011   Procedure: LAPAROSCOPIC CHOLECYSTECTOMY WITH INTRAOPERATIVE CHOLANGIOGRAM;  Surgeon: Odis Hollingshead, MD;  Location: [facility];  Service: General;  Laterality: N/A;  Needs IOC and C-arm   DIAGNOSTIC LAPAROSCOPY  07/2007   ?Lysis Adhesions for obstructive right fallopian tube   ESOPHAGOGASTRODUODENOSCOPY  01/30/2011   Procedure: ESOPHAGOGASTRODUODENOSCOPY (EGD);  Surgeon: Zenovia Jarred, MD;  Location: [facility];  Service: Gastroenterology;  Laterality: N/A;   ESOPHAGOGASTRODUODENOSCOPY  03/03/2011   Procedure: ESOPHAGOGASTRODUODENOSCOPY (EGD);  Surgeon: Zenovia Jarred, MD;  Location: [facility];  Service: Gastroenterology;  Laterality: N/A;   HYSTEROSCOPY N/A 03/22/2016   Procedure: HYSTEROSCOPY, POLYPECTOMY;  Surgeon: Governor Specking, MD;  Location: [facility];  Service: Gynecology;  Laterality: N/A;   HYSTEROSCOPY WITH RESECTOSCOPE N/A 09/19/2017   Procedure: HYSTEROSCOPY MYOMECTOMY;  Surgeon: Governor Specking, MD;  Location[facility];  Service: Gynecology;  Laterality: N/A;   LAPAROSCOPIC GELPORT ASSISTED MYOMECTOMY N/A 03/22/2016   Procedure: LAPAROSCOPIC GELPORT ASSISTED MYOMECTOMY, LYSIS OF ADHESIONS, CHROMOPERTUBATION;  Surgeon: Governor Specking, MD;  Location: [facility];  Service: Gynecology;  Laterality: N/A;   TRANSTHORACIC ECHOCARDIOGRAM       Home Medications Prior  to Admission medications   Medication Sig Start Date End Date Taking? Authorizing Provider  albuterol (PROVENTIL HFA;VENTOLIN HFA) 108 (90 Base) MCG/ACT inhaler Inhale 2 puffs into the lungs every 6 (six) hours as needed for wheezing or shortness of  breath. 02/28/17   Dara Hoyer, FNP  budesonide-formoterol (SYMBICORT) 80-4.5 MCG/ACT inhaler Inhale 2 puffs into the lungs every morning. Patient taking differently: Inhale 2 puffs into the lungs as needed (asthma).  02/28/17   Dara Hoyer, FNP  calcium carbonate (TUMS - DOSED IN MG ELEMENTAL CALCIUM) 500 MG chewable tablet Chew 1 tablet by mouth as needed for indigestion or heartburn.    [provider]  EPINEPHrine 0.3 mg/0.3 mL IJ SOAJ injection Inject into the muscle once.    [provider]  HYDROmorphone (DILAUDID) 2 MG tablet Take 1 tablet (2 mg total) by mouth every 4 (four) hours as needed for severe pain. 03/05/19   Bovard-Stuckert, Jeral Fruit, MD  ibuprofen (ADVIL) 800 MG tablet Take 1 tablet (800 mg total) by mouth every 8 (eight) hours. 03/05/19   Bovard-Stuckert, Jeral Fruit, MD  labetalol (NORMODYNE) 300 MG tablet Take 1 tablet (300 mg total) by mouth every 8 (eight) hours. 03/05/19   Bovard-Stuckert, Jeral Fruit, MD  meclizine (ANTIVERT) 25 MG tablet Take 1 tablet (25 mg total) by mouth 3 (three) times daily as needed for dizziness. 03/31/21   Rada Hay, MD  NIFEdipine (PROCARDIA XL/NIFEDICAL XL) 60 MG 24 hr tablet Take 1 tablet (60 mg total) by mouth 2 (two) times daily. 03/05/19   Bovard-Stuckert, Jeral Fruit, MD  potassium chloride SA (KLOR-CON M) 20 MEQ tablet Take 1 tablet (20 mEq total) by mouth 2 (two) times daily for 5 days. 03/31/21 04/05/21  Rada Hay, MD  Prenatal Vit-Fe Fumarate-FA (PRENATAL VITAMINS) 28-0.8 MG TABS Take 1 tablet by mouth every morning. 03/05/19   Bovard-Stuckert, Jeral Fruit, MD      Allergies    Phenergan [promethazine], Shellfish allergy, Codeine, K-tab [potassium chloride er], Milk-related compounds, Other, Oxycodone, Tramadol, and Hydrocodone    Review of Systems   Review of Systems  Physical Exam Updated Vital Signs BP (!) 160/95   Pulse 66   Temp 98.3 F (36.8 C)   Resp 20   Ht '5\' 1"'$  (1.549 m)   Wt 100.2 kg   LMP 08/21/2021   SpO2 100%   BMI  41.76 kg/m  Physical Exam Vitals and nursing note reviewed.  Constitutional:      General: She is not in acute distress.    Appearance: Normal appearance. She is well-developed. She is not ill-appearing or diaphoretic.  HENT:     Head: Normocephalic and atraumatic.  Eyes:     General: No visual field deficit.    Extraocular Movements: Extraocular movements intact.     Conjunctiva/sclera: Conjunctivae normal.     Pupils: Pupils are equal, round, and reactive to light.  Cardiovascular:     Rate and Rhythm: Normal rate and regular rhythm.     Pulses: Normal pulses.     Heart sounds: Normal heart sounds. No murmur heard.    No friction rub. No gallop.  Pulmonary:     Effort: Pulmonary effort is normal. No respiratory distress.     Breath sounds: Normal breath sounds. No wheezing or rales.  Abdominal:     General: There is no distension.     Palpations: Abdomen is soft.     Tenderness: There is no abdominal tenderness. There is no guarding.  Musculoskeletal:  General: No swelling or tenderness.     Cervical back: Normal range of motion.  Skin:    General: Skin is warm and dry.     Findings: No erythema or rash.  Neurological:     General: No focal deficit present.     Mental Status: She is alert and oriented to person, place, and time.     GCS: GCS eye subscore is 4. GCS verbal subscore is 5. GCS motor subscore is 6.     Cranial Nerves: No cranial nerve deficit, dysarthria or facial asymmetry.     Sensory: No sensory deficit.     Motor: No weakness or tremor.     Coordination: Coordination normal. Finger-Nose-Finger Test normal.     Gait: Gait normal.     ED Results / Procedures / Treatments   Labs (all labs ordered are listed, but only abnormal results are displayed) Labs Reviewed  BASIC METABOLIC PANEL - Abnormal; Notable for the following components:      Result Value   Potassium 3.4 (*)    All other components within normal limits  CBC  URINALYSIS, ROUTINE W  REFLEX MICROSCOPIC  PREGNANCY, URINE    EKG EKG Interpretation  Date/Time:  'Sunday October 09 2021 10:57:50 EDT Ventricular Rate:  72 PR Interval:  200 QRS Duration: 92 QT Interval:  404 QTC Calculation: 442 R Axis:   25 Text Interpretation: Normal sinus rhythm Cannot rule out Anterior infarct , age undetermined Abnormal ECG When compared with ECG of 31-Mar-2021 08:54, No significant change was found Confirmed by Alona Danford ([id number]) on 10/09/2021 12:00:21 PM  Radiology No results found.  Procedures Procedures    Medications Ordered in ED Medications - No data to display  ED Course/ Medical Decision Making/ A&P                           Medical Decision Making Amount and/or Complexity of Data Reviewed Labs: ordered.   46 year old female with a history of hypertension, gastritis, ovarian cyst, anemia, hypokalemia, asthma, presents with concern for near syncope and palpitations.  EKG was completed and personally evaluated by me and showed no evidence of acute ST changes or arrhythmia.  Labs obtained and personally evaluated interpreted by me show no significant anemia, or significant electrolyte abnormalities to account for cardiac arrhythmia.  Pregnancy test negative. She does have mild hypokalemia with a potassium of 3.4.  She is given replacement while in the emergency department.  She has been treating her hypokalemia at home with prescribed an over-the-counter potassium and it has improved from 3.3-3.4 today.  Recommend continuing the prescribed potassium and following up with her regular doctor.  She has not chest pain, low suspicion for ACS, dissection.  Have low suspicion for PE given no hypoxia, tachypnea, asymmetric leg swelling, tachycardia, or continued symptoms of shortness of breath.  Discussed possibility of dehydration leading to episode last night, but is also difficult to exclude cardiac arrhythmia, given symptoms, as well as prior syncopal episode, will refer  for further cardiac evaluation. Patient discharged in stable condition with understanding of reasons to return.         Final Clinical Impression(s) / ED Diagnoses Final diagnoses:  Palpitations  Near syncope    Rx / DC Orders ED Discharge Orders          Ordered    Ambulatory referral to Cardiology        07'$ /16/23 1424  Gareth Morgan, MD 10/09/21 2330

## 2021-10-09 NOTE — ED Triage Notes (Signed)
Patient requests to have her potassium levels checked. Last night while in shower she felt like she was going to pass out and having heart palpitations. Patient has history of low potassium starting in January.

## 2021-10-09 NOTE — ED Notes (Signed)
Pt complains of urinary frequency x 2 days and dizziness started last night

## 2021-10-30 NOTE — Progress Notes (Signed)
Cardiology Office Note:    Date:  10/31/2021   ID:  Victoria George, DOB 1975/09/26, MRN [id number]  PCP:  Vonna Drafts, FNP  Cardiologist:  None  Electrophysiologist:  None   Referring MD: Gareth Morgan, MD   Chief Complaint  Patient presents with   Loss of Consciousness    History of Present Illness:    Victoria George is a 46 y.o. female with a hx of hypertension, asthma who presents as an ED follow-up for palpitations.  She was seen in the ED on 10/09/2021 with palpitations.  Work-up showed mild hypokalemia, otherwise unremarkable.  Reports in January 2023 she had a syncopal episode states that when she woke up that morning the room was spinning.  She tried to stand up and then passed out.  Went to ED, potassium was 2.7.  She has not had any further syncopal episodes since this time.  She denies any chest pain, dyspnea, or lower extremity edema.  No smoking history.  Family history includes maternal grandfather/maternal uncle/mother has CHF.   Past Medical History:  Diagnosis Date   Anemia 03/02/2011   History of gastritis    12/ 2012   History of ovarian cyst    HTN (hypertension) 03/02/2011   Hypertension    Iron deficiency anemia    Low serum potassium level    Moderate persistent asthma, uncomplicated    [facility] dr Nelva Bush   Seasonal allergic conjunctivitis    Seasonal and perennial allergic rhinitis    Status post primary low transverse cesarean section 03/02/2019   Submucous leiomyoma of uterus    Uterine fibroids affecting pregnancy in third trimester 03/02/2019   Wears glasses     Past Surgical History:  Procedure Laterality Date   CARDIOVASCULAR STRESS TEST     CESAREAN SECTION N/A 03/02/2019   Procedure: CESAREAN SECTION;  Surgeon: Sherlyn Hay, DO;  Location: [facility];  Service: Obstetrics;  Laterality: N/A;   CHOLECYSTECTOMY  02/01/2011   Procedure: LAPAROSCOPIC CHOLECYSTECTOMY WITH INTRAOPERATIVE CHOLANGIOGRAM;   Surgeon: Odis Hollingshead, MD;  Location: [facility];  Service: General;  Laterality: N/A;  Needs IOC and C-arm   DIAGNOSTIC LAPAROSCOPY  07/2007   ?Lysis Adhesions for obstructive right fallopian tube   ESOPHAGOGASTRODUODENOSCOPY  01/30/2011   Procedure: ESOPHAGOGASTRODUODENOSCOPY (EGD);  Surgeon: Zenovia Jarred, MD;  Location: [facility];  Service: Gastroenterology;  Laterality: N/A;   ESOPHAGOGASTRODUODENOSCOPY  03/03/2011   Procedure: ESOPHAGOGASTRODUODENOSCOPY (EGD);  Surgeon: Zenovia Jarred, MD;  Location: [facility];  Service: Gastroenterology;  Laterality: N/A;   HYSTEROSCOPY N/A 03/22/2016   Procedure: HYSTEROSCOPY, POLYPECTOMY;  Surgeon: Governor Specking, MD;  Location: [place];  Service: Gynecology;  Laterality: N/A;   HYSTEROSCOPY WITH RESECTOSCOPE N/A 09/19/2017   Procedure: HYSTEROSCOPY MYOMECTOMY;  Surgeon: Governor Specking, MD;  Location: [facility];  Service: Gynecology;  Laterality: N/A;   LAPAROSCOPIC GELPORT ASSISTED MYOMECTOMY N/A 03/22/2016   Procedure: LAPAROSCOPIC GELPORT ASSISTED MYOMECTOMY, LYSIS OF ADHESIONS, CHROMOPERTUBATION;  Surgeon: Governor Specking, MD;  Location: [place];  Service: Gynecology;  Laterality: N/A;   TRANSTHORACIC ECHOCARDIOGRAM      Current Medications: Current Meds  Medication Sig   albuterol (PROVENTIL HFA;VENTOLIN HFA) 108 (90 Base) MCG/ACT inhaler Inhale 2 puffs into the lungs every 6 (six) hours as needed for wheezing or shortness of breath.   budesonide-formoterol (SYMBICORT) 80-4.5 MCG/ACT inhaler Inhale 2 puffs into the lungs every morning. (Patient taking differently: Inhale 2 puffs into the lungs as needed (  asthma).)   EPINEPHrine 0.3 mg/0.3 mL IJ SOAJ injection Inject into the muscle once.   hydrochlorothiazide (HYDRODIURIL) 25 MG tablet Take 1 tablet every day by oral route.   Multiple Vitamin (MULTIVITAMIN) capsule Take 1 capsule by mouth daily.   phentermine (ADIPEX-P) 37.5 MG tablet  Take 1 tablet by mouth daily.   [DISCONTINUED] HYDROmorphone (DILAUDID) 2 MG tablet Take 1 tablet (2 mg total) by mouth every 4 (four) hours as needed for severe pain.   [DISCONTINUED] ibuprofen (ADVIL) 800 MG tablet Take 1 tablet (800 mg total) by mouth every 8 (eight) hours.   [DISCONTINUED] labetalol (NORMODYNE) 300 MG tablet Take 1 tablet (300 mg total) by mouth every 8 (eight) hours.   [DISCONTINUED] meclizine (ANTIVERT) 25 MG tablet Take 1 tablet (25 mg total) by mouth 3 (three) times daily as needed for dizziness.   [DISCONTINUED] NIFEdipine (PROCARDIA XL/NIFEDICAL XL) 60 MG 24 hr tablet Take 1 tablet (60 mg total) by mouth 2 (two) times daily.   [DISCONTINUED] Prenatal Vit-Fe Fumarate-FA (PRENATAL VITAMINS) 28-0.8 MG TABS Take 1 tablet by mouth every morning.     Allergies:   Phenergan [promethazine], Shellfish allergy, Acetaminophen-codeine, Bee pollen, Codeine, K-tab [potassium chloride er], Milk-related compounds, Other, Oxycodone, Tramadol, and Hydrocodone   Social History   Socioeconomic History   Marital status: Married    Spouse name: Not on file   Number of children: 0   Years of education: Not on file   Highest education level: Not on file  Occupational History   Occupation: Nurse, adult: behavorial health  Tobacco Use   Smoking status: Never   Smokeless tobacco: Never  Vaping Use   Vaping Use: Never used  Substance and Sexual Activity   Alcohol use: Not Currently    Comment: last drink 2 years ago   Drug use: No   Sexual activity: Yes    Partners: Male  Other Topics Concern   Not on file  Social History Narrative   Not on file   Social Determinants of Health   Financial Resource Strain: Not on file  Food Insecurity: Not on file  Transportation Needs: Not on file  Physical Activity: Not on file  Stress: Not on file  Social Connections: Not on file     Family History: The patient's family history includes Colon cancer in her maternal  grandmother; Diabetes in her brother, maternal grandmother, and paternal grandmother; Diverticulosis in her paternal grandmother; Hypertension in her mother; Kidney disease in her maternal grandmother. There is no history of Anesthesia problems, Malignant hyperthermia, or Pseudochol deficiency.  ROS:   Please see the history of present illness.     All other systems reviewed and are negative.  EKGs/Labs/Other Studies Reviewed:    The following studies were reviewed today:   EKG:   10/31/2021: Sinus rhythm, rate 90, poor R wave progression  Recent Labs: 03/31/2021: Magnesium 1.9 10/09/2021: BUN 12; Creatinine, Ser 0.88; Hemoglobin 13.4; Platelets 341; Potassium 3.4; Sodium 137  Recent Lipid Panel No results found for: "CHOL", "TRIG", "HDL", "CHOLHDL", "VLDL", "LDLCALC", "LDLDIRECT"  Physical Exam:    VS:  BP 128/72   Pulse 90   Ht '5\' 1"'$  (1.549 m)   Wt 229 lb 6.4 oz (104.1 kg)   LMP 08/21/2021   SpO2 98%   BMI 43.34 kg/m     Wt Readings from Last 3 Encounters:  10/31/21 229 lb 6.4 oz (104.1 kg)  10/09/21 221 lb (100.2 kg)  03/31/21 218 lb (98.9 kg)  GEN:  Well nourished, well developed in no acute distress HEENT: Normal NECK: No JVD; No carotid bruits LYMPHATICS: No lymphadenopathy CARDIAC: RRR, no murmurs, rubs, gallops RESPIRATORY:  Clear to auscultation without rales, wheezing or rhonchi  ABDOMEN: Soft, non-tender, non-distended MUSCULOSKELETAL:  No edema; No deformity  SKIN: Warm and dry NEUROLOGIC:  Alert and oriented x 3 PSYCHIATRIC:  Normal affect   ASSESSMENT:    1. Syncope and collapse   2. Palpitations   3. Nonspecific abnormal electrocardiogram (ECG) (EKG)   4. Essential hypertension   5. Hypokalemia    PLAN:    Syncope: Description suggest orthostatic hypotension in setting of dehydration, likely due to HCTZ use.  Discussed discontinuing HCTZ and starting alternative antihypertensive but she declines.  Recommend echocardiogram to rule obstructive  heart disease.  Recommend Zio patch x 2 weeks to evaluate for arrhythmia.  Hypertension: On HCTZ 25 mg daily.  BP appears controlled, but recommended switching to alternative antihypertensive given hypokalemia as below.  She declines at this time, would prefer to stay on HCTZ  Hypokalemia: Likely due to hydrochlorothiazide use.  Recommend switching to alternative antihypertensive.  She declines to switch.  Recheck BMET, if low potassium would strongly encourage switching to alternative antihypertensive  Abnormal EKG: Poor R wave progression, check echocardiogram  RTC in 6 months   Medication Adjustments/Labs and Tests Ordered: Current medicines are reviewed at length with the patient today.  Concerns regarding medicines are outlined above.  Orders Placed This Encounter  Procedures   Basic metabolic panel   Magnesium   LONG TERM MONITOR (3-14 DAYS)   EKG 12-Lead   ECHOCARDIOGRAM COMPLETE   No orders of the defined types were placed in this encounter.   Patient Instructions  Medication Instructions:  Your physician recommends that you continue on your current medications as directed. Please refer to the Current Medication list given to you today.  *If you need a refill on your cardiac medications before your next appointment, please call your pharmacy*   Lab Work: BMET and Magnesium TODAY If you have labs (blood work) drawn today and your tests are completely normal, you will receive your results only by: [place] (if you have MyChart) OR A paper copy in the mail If you have any lab test that is abnormal or we need to change your treatment, we will call you to review the results.   Testing/Procedures: Your physician has requested that you have an echocardiogram. Echocardiography is a painless test that uses sound waves to create images of your heart. It provides your doctor with information about the size and shape of your heart and how well your heart's chambers and  valves are working. This procedure takes approximately one hour. There are no restrictions for this procedure.   ZIO XT- Long Term Monitor Instructions   Your physician has requested you wear a ZIO patch monitor for _14_ days.  This is a single patch monitor.   [employer] supplies one patch monitor per enrollment. Additional stickers are not available. Please do not apply patch if you will be having a Nuclear Stress Test, Echocardiogram, Cardiac CT, MRI, or Chest Xray during the period you would be wearing the monitor. The patch cannot be worn during these tests. You cannot remove and re-apply the ZIO XT patch monitor.  Your ZIO patch monitor will be sent Fed Ex from [employer] directly to your home address. It may take 3-5 days to receive your monitor after you have been enrolled.  Once you have  received your monitor, please review the enclosed instructions. Your monitor has already been registered assigning a specific monitor serial # to you.  Billing and Patient Assistance Program Information   We have supplied [employer] with any of your insurance information on file for billing purposes. [employer] offers a sliding scale Patient Assistance Program for patients that do not have insurance, or whose insurance does not completely cover the cost of the ZIO monitor.   You must apply for the Patient Assistance Program to qualify for this discounted rate.     To apply, please call [employer] at [phone], select option 4, then select option 2, and ask to apply for Patient Assistance Program.  Theodore Demark will ask your household income, and how many people are in your household.  They will quote your out-of-pocket cost based on that information.  [employer] will also be able to set up a 52-month interest-free payment plan if needed.  Applying the monitor   Shave hair from upper left chest.  Hold abrader disc by orange tab. Rub abrader in 40 strokes over the upper left chest as indicated in your monitor  instructions.  Clean area with 4 enclosed alcohol pads. Let dry.  Apply patch as indicated in monitor instructions. Patch will be placed under collarbone on left side of chest with arrow pointing upward.  Rub patch adhesive wings for 2 minutes. Remove white label marked "1". Remove the white label marked "2". Rub patch adhesive wings for 2 additional minutes.  While looking in a mirror, press and release button in center of patch. A small green light will flash 3-4 times. This will be your only indicator that the monitor has been turned on. ?  Do not shower for the first 24 hours. You may shower after the first 24 hours.  Press the button if you feel a symptom. You will hear a small click. Record Date, Time and Symptom in the Patient Logbook.  When you are ready to remove the patch, follow instructions on the last 2 pages of the Patient Logbook. Stick patch monitor onto the last page of Patient Logbook.  Place Patient Logbook in the blue and white box.  Use locking tab on box and tape box closed securely.  The blue and white box has prepaid postage on it. Please place it in the mailbox as soon as possible. Your physician should have your test results approximately 7 days after [facility]  Call [place] [phone] you have questions regarding your ZIO XT patch monitor. Call them immediately if you see an orange light blinking on your monitor.  If your monitor falls off in less than 4 days, contact our Monitor department at [phone] ?If your monitor becomes loose or falls off after 4 days call [employer] at [phone] suggestions on securing your monitor.?   Follow-Up: At [facility] you and your health needs are our priority.  As part of our continuing mission to provide you with exceptional heart care, we have created designated Provider Care Teams.  These Care Teams include your primary Cardiologist (physician) and  Advanced Practice Providers (APPs -  Physician Assistants and Nurse Practitioners) who all work together to provide you with the care you need, when you need it.  We recommend signing up for the patient portal called "MyChart".  Sign up information is provided on this After Visit Summary.  MyChart is used to connect with patients for Virtual Visits (Telemedicine).  Patients  are able to view lab/test results, encounter notes, upcoming appointments, etc.  Non-urgent messages can be sent to your provider as well.   To learn more about what you can do with MyChart, go to [email or web address].    Your next appointment:   6 month(s)  The format for your next appointment:   In Person  Provider:   DR. Gardiner Rhyme    Important Information About Sugar         Signed, Donato Heinz, MD  10/31/2021 4:48 PM    [place]

## 2021-10-31 ENCOUNTER — Encounter: Payer: Self-pay | Admitting: Cardiology

## 2021-10-31 ENCOUNTER — Ambulatory Visit: Payer: PRIVATE HEALTH INSURANCE

## 2021-10-31 ENCOUNTER — Ambulatory Visit (INDEPENDENT_AMBULATORY_CARE_PROVIDER_SITE_OTHER): Payer: PRIVATE HEALTH INSURANCE | Admitting: Cardiology

## 2021-10-31 VITALS — BP 128/72 | HR 90 | Ht 61.0 in | Wt 229.4 lb

## 2021-10-31 DIAGNOSIS — R55 Syncope and collapse: Secondary | ICD-10-CM | POA: Diagnosis not present

## 2021-10-31 DIAGNOSIS — I10 Essential (primary) hypertension: Secondary | ICD-10-CM

## 2021-10-31 DIAGNOSIS — R9431 Abnormal electrocardiogram [ECG] [EKG]: Secondary | ICD-10-CM

## 2021-10-31 DIAGNOSIS — E876 Hypokalemia: Secondary | ICD-10-CM

## 2021-10-31 DIAGNOSIS — R002 Palpitations: Secondary | ICD-10-CM | POA: Diagnosis not present

## 2021-10-31 NOTE — Patient Instructions (Addendum)
Medication Instructions:  Your physician recommends that you continue on your current medications as directed. Please refer to the Current Medication list given to you today.  *If you need a refill on your cardiac medications before your next appointment, please call your pharmacy*   Lab Work: BMET and Magnesium TODAY If you have labs (blood work) drawn today and your tests are completely normal, you will receive your results only by: [facility] (if you have MyChart) OR A paper copy in the mail If you have any lab test that is abnormal or we need to change your treatment, we will call you to review the results.   Testing/Procedures: Your physician has requested that you have an echocardiogram. Echocardiography is a painless test that uses sound waves to create images of your heart. It provides your doctor with information about the size and shape of your heart and how well your heart's chambers and valves are working. This procedure takes approximately one hour. There are no restrictions for this procedure.   ZIO XT- Long Term Monitor Instructions   Your physician has requested you wear a ZIO patch monitor for _14_ days.  This is a single patch monitor.   [employer] supplies one patch monitor per enrollment. Additional stickers are not available. Please do not apply patch if you will be having a Nuclear Stress Test, Echocardiogram, Cardiac CT, MRI, or Chest Xray during the period you would be wearing the monitor. The patch cannot be worn during these tests. You cannot remove and re-apply the ZIO XT patch monitor.  Your ZIO patch monitor will be sent Fed Ex from [employer] directly to your home address. It may take 3-5 days to receive your monitor after you have been enrolled.  Once you have received your monitor, please review the enclosed instructions. Your monitor has already been registered assigning a specific monitor serial # to you.  Billing and Patient Assistance Program  Information   We have supplied [employer] with any of your insurance information on file for billing purposes. [employer] offers a sliding scale Patient Assistance Program for patients that do not have insurance, or whose insurance does not completely cover the cost of the ZIO monitor.   You must apply for the Patient Assistance Program to qualify for this discounted rate.     To apply, please call [employer] at [phone], select option 4, then select option 2, and ask to apply for Patient Assistance Program.  Theodore Demark will ask your household income, and how many people are in your household.  They will quote your out-of-pocket cost based on that information.  [employer] will also be able to set up a 25-month interest-free payment plan if needed.  Applying the monitor   Shave hair from upper left chest.  Hold abrader disc by orange tab. Rub abrader in 40 strokes over the upper left chest as indicated in your monitor instructions.  Clean area with 4 enclosed alcohol pads. Let dry.  Apply patch as indicated in monitor instructions. Patch will be placed under collarbone on left side of chest with arrow pointing upward.  Rub patch adhesive wings for 2 minutes. Remove white label marked "1". Remove the white label marked "2". Rub patch adhesive wings for 2 additional minutes.  While looking in a mirror, press and release button in center of patch. A small green light will flash 3-4 times. This will be your only indicator that the monitor has been turned on. ?  Do not shower for the first 24  hours. You may shower after the first 24 hours.  Press the button if you feel a symptom. You will hear a small click. Record Date, Time and Symptom in the Patient Logbook.  When you are ready to remove the patch, follow instructions on the last 2 pages of the Patient Logbook. Stick patch monitor onto the last page of Patient Logbook.  Place Patient Logbook in the blue and white box.  Use locking tab on box and tape box closed  securely.  The blue and white box has prepaid postage on it. Please place it in the mailbox as soon as possible. Your physician should have your test results approximately 7 days after the monitor has been mailed back to [facility]  Call [place] at [phone] if you have questions regarding your ZIO XT patch monitor. Call them immediately if you see an orange light blinking on your monitor.  If your monitor falls off in less than 4 days, contact our Monitor department at [phone]. ?If your monitor becomes loose or falls off after 4 days call [employer] at [phone] for suggestions on securing your monitor.?   Follow-Up: At [facility], you and your health needs are our priority.  As part of our continuing mission to provide you with exceptional heart care, we have created designated Provider Care Teams.  These Care Teams include your primary Cardiologist (physician) and Advanced Practice Providers (APPs -  Physician Assistants and Nurse Practitioners) who all work together to provide you with the care you need, when you need it.  We recommend signing up for the patient portal called "MyChart".  Sign up information is provided on this After Visit Summary.  MyChart is used to connect with patients for Virtual Visits (Telemedicine).  Patients are able to view lab/test results, encounter notes, upcoming appointments, etc.  Non-urgent messages can be sent to your provider as well.   To learn more about what you can do with MyChart, go to [email or web address].    Your next appointment:   6 month(s)  The format for your next appointment:   In Person  Provider:   DR. Gardiner Rhyme    Important Information About Sugar

## 2021-10-31 NOTE — Progress Notes (Unsigned)
Enrolled for [employer] to mail a ZIO XT long term holter monitor to the patients address on file.  

## 2021-11-01 LAB — BASIC METABOLIC PANEL
BUN/Creatinine Ratio: 13 (ref 9–23)
BUN: 13 mg/dL (ref 6–24)
CO2: 26 mmol/L (ref 20–29)
Calcium: 9.5 mg/dL (ref 8.7–10.2)
Chloride: 96 mmol/L (ref 96–106)
Creatinine, Ser: 0.97 mg/dL (ref 0.57–1.00)
Glucose: 87 mg/dL (ref 70–99)
Potassium: 4 mmol/L (ref 3.5–5.2)
Sodium: 135 mmol/L (ref 134–144)
eGFR: 72 mL/min/{1.73_m2} (ref >59)

## 2021-11-01 LAB — MAGNESIUM: Magnesium: 2.2 mg/dL (ref 1.6–2.3)

## 2021-11-03 ENCOUNTER — Telehealth: Payer: Self-pay | Admitting: Cardiology

## 2021-11-03 NOTE — Telephone Encounter (Signed)
Pt states is returning a call from Cherylann Ratel, Therapist, sports.

## 2021-11-03 NOTE — Telephone Encounter (Signed)
Patient stated that back in January, she passed out, went to ED and had K+ of 2.7. She went to ED on 7/16 because, "I felt like I did back in January and wanted to get my blood checked. My doctor was closed on Sunday. I just went to get my potassium checked." She was referred to cardiology. Patient stated she does not need cardiology because she feels fine. She stated she is not paying for the heart monitor. I explained that [employer] has patient assistance and 4-monthno interest payment plan. She also is to contact her insurance to see what it will cost her to have echo, which she might cancel. Again, she stated she feels fine. Denies palpitations. She will contact clinic if she cancels echo.

## 2021-11-03 NOTE — Telephone Encounter (Signed)
Patient is calling stating she cannot afford the heart monitor, so she does not wish to go through with it. She spoke with [employer] yesterday making them aware as well.

## 2021-11-03 NOTE — Telephone Encounter (Signed)
Follow Up:     Patient is returning Sarah's call from today.

## 2021-11-03 NOTE — Telephone Encounter (Signed)
LMTCB

## 2021-11-08 ENCOUNTER — Encounter: Payer: Self-pay | Admitting: *Deleted

## 2021-11-11 ENCOUNTER — Ambulatory Visit (INDEPENDENT_AMBULATORY_CARE_PROVIDER_SITE_OTHER): Payer: PRIVATE HEALTH INSURANCE

## 2021-11-11 DIAGNOSIS — R55 Syncope and collapse: Secondary | ICD-10-CM | POA: Diagnosis not present

## 2021-11-11 LAB — ECHOCARDIOGRAM COMPLETE
AR max vel: 2.23 cm2
AV Area VTI: 2.4 cm2
AV Area mean vel: 2.2 cm2
AV Mean grad: 3 mmHg
AV Peak grad: 6.3 mmHg
Ao pk vel: 1.25 m/s
Area-P 1/2: 3.81 cm2
Calc EF: 56.3 %
S' Lateral: 2.33 cm
Single Plane A2C EF: 50.3 %
Single Plane A4C EF: 62.6 %

## 2021-11-16 ENCOUNTER — Encounter: Payer: Self-pay | Admitting: *Deleted

## 2021-11-20 ENCOUNTER — Emergency Department (HOSPITAL_BASED_OUTPATIENT_CLINIC_OR_DEPARTMENT_OTHER)
Admission: EM | Admit: 2021-11-20 | Discharge: 2021-11-20 | Disposition: A | Discharge status: Home / Self Care | Payer: PRIVATE HEALTH INSURANCE | Attending: Emergency Medicine | Admitting: Emergency Medicine

## 2021-11-20 ENCOUNTER — Encounter (HOSPITAL_BASED_OUTPATIENT_CLINIC_OR_DEPARTMENT_OTHER): Payer: Self-pay

## 2021-11-20 ENCOUNTER — Other Ambulatory Visit: Payer: Self-pay

## 2021-11-20 DIAGNOSIS — T7840XA Allergy, unspecified, initial encounter: Secondary | ICD-10-CM | POA: Diagnosis not present

## 2021-11-20 MED ORDER — EPINEPHRINE 0.3 MG/0.3ML IJ SOAJ: 0.3000 mg | Freq: Once | INTRAMUSCULAR | 0 refills | Status: AC

## 2021-11-20 MED ORDER — PREDNISONE 20 MG PO TABS: 40.0000 mg | ORAL_TABLET | Freq: Every day | ORAL | 0 refills | Status: AC

## 2021-11-20 MED ORDER — DEXAMETHASONE SODIUM PHOSPHATE 10 MG/ML IJ SOLN
10.0000 mg | Freq: Once | INTRAMUSCULAR | Status: AC
  Administered 2021-11-20: 10 mg via INTRAMUSCULAR
  Filled 2021-11-20: qty 1

## 2021-11-20 NOTE — ED Provider Notes (Signed)
[facility] Provider Note   CSN: [id number] Arrival date & time: 11/20/21  1253     History  Chief Complaint  Patient presents with   Allergic Reaction    Victoria George is a 46 y.o. female who presents to the emergency department with concerns for allergic reaction onset yesterday.  Notes that she had her hair braided 4 days ago.  The hair was not washed before hand.  She has had similar reactions in the past when her breathing here was not washed.  Noted on yesterday pruritic rash to her bilateral upper and lower extremities as well as lip swelling.  Has tried Benadryl and Atarax without relief of her symptoms.  Denies fever, chest pain, shortness of breath, trouble swallowing.   The history is provided by the patient. No language interpreter was used.       Home Medications Prior to Admission medications   Medication Sig Start Date End Date Taking? Authorizing Provider  predniSONE (DELTASONE) 20 MG tablet Take 2 tablets (40 mg total) by mouth daily for 5 days. 11/20/21 11/25/21 Yes Woodson Macha A, PA-C  albuterol (PROVENTIL HFA;VENTOLIN HFA) 108 (90 Base) MCG/ACT inhaler Inhale 2 puffs into the lungs every 6 (six) hours as needed for wheezing or shortness of breath. 02/28/17   Dara Hoyer, FNP  budesonide-formoterol (SYMBICORT) 80-4.5 MCG/ACT inhaler Inhale 2 puffs into the lungs every morning. Patient taking differently: Inhale 2 puffs into the lungs as needed (asthma). 02/28/17   Dara Hoyer, FNP  hydrochlorothiazide (HYDRODIURIL) 25 MG tablet Take 1 tablet every day by oral route.    [provider]  Multiple Vitamin (MULTIVITAMIN) capsule Take 1 capsule by mouth daily.    [provider]  phentermine (ADIPEX-P) 37.5 MG tablet Take 1 tablet by mouth daily.    [provider]      Allergies    Phenergan [promethazine], Shellfish allergy, Acetaminophen-codeine, Bee pollen, Codeine, K-tab [potassium chloride er],  Milk-related compounds, Other, Oxycodone, Tramadol, and Hydrocodone    Review of Systems   Review of Systems  Constitutional:  Negative for fever.  HENT:  Positive for facial swelling. Negative for trouble swallowing.   Respiratory:  Negative for shortness of breath.   Cardiovascular:  Negative for chest pain.  Skin:  Positive for rash.  All other systems reviewed and are negative.   Physical Exam Updated Vital Signs BP (!) 136/97   Pulse (!) 101   Temp 97.8 F (36.6 C)   Resp 16   SpO2 99%  Physical Exam Vitals and nursing note reviewed.  Constitutional:      General: She is not in acute distress.    Appearance: She is not diaphoretic.  HENT:     Head: Normocephalic and atraumatic.     Mouth/Throat:     Mouth: Mucous membranes are moist.     Pharynx: Oropharynx is clear. Uvula midline. No oropharyngeal exudate or posterior oropharyngeal erythema.     Tonsils: No tonsillar exudate.     Comments: Swelling noted to left upper lip.  Uvula midline without swelling.  No posterior pharyngeal erythema or tonsillar exudate noted. Eyes:     General: No scleral icterus.    Conjunctiva/sclera: Conjunctivae normal.  Cardiovascular:     Rate and Rhythm: Normal rate and regular rhythm.     Pulses: Normal pulses.     Heart sounds: Normal heart sounds.  Pulmonary:     Effort: Pulmonary effort is normal. No respiratory distress.  Breath sounds: Normal breath sounds. No wheezing.  Abdominal:     General: Bowel sounds are normal.     Palpations: Abdomen is soft. There is no mass.     Tenderness: There is no abdominal tenderness. There is no guarding or rebound.  Musculoskeletal:        General: Normal range of motion.     Cervical back: Normal range of motion and neck supple.  Skin:    General: Skin is warm and dry.     Findings: Rash present. Rash is urticarial.     Comments: Urticarial rash noted diffusely throughout bilateral upper and lower extremities.  Neurological:      Mental Status: She is alert.  Psychiatric:        Behavior: Behavior normal.     ED Results / Procedures / Treatments   Labs (all labs ordered are listed, but only abnormal results are displayed) Labs Reviewed - No data to display  EKG None  Radiology No results found.  Procedures Procedures    Medications Ordered in ED Medications  dexamethasone (DECADRON) injection 10 mg (10 mg Intramuscular Given 11/20/21 1656)    ED Course/ Medical Decision Making/ A&P                           Medical Decision Making Risk Prescription drug management.   Patient presents to the ED with concerns for allergic reaction onset yesterday. Tried benadryl and atarax at home. Pt afebrile.  On arrival to the ED, patient alert and oriented, no respiratory distress, no feeling of throat closing sensation, tolerating secretions well, patent airway, able to speak in complete sentences. With pruritic rash to BUE and BLE. No need for supplemental oxygen at this time.  No need for  epinephrine at this time. No concern for airway compromise at this time. Differential diagnosis includes anaphylaxis, angioedema, allergic reaction, contact dermatitis.    Medications:  I ordered medication including decadron for allergic reaction I have reviewed the patients home medicines and have made adjustments as needed    Disposition: Presentation suspicious for allergic reaction. Doubt contact dermatitis at this time. Doubt anaphylaxis. After consideration of the diagnostic results and the patients response to treatment, I feel that the patient would benefit from Discharge home. Prescription for prednisone and epinephrine given. Supportive care measures and strict return precautions discussed with patient at bedside. Pt acknowledges and verbalizes understanding. Pt appears safe for discharge. Follow up as indicated in discharge paperwork.  This chart was dictated using voice recognition software, Dragon. Despite the  best efforts of this provider to proofread and correct errors, errors may still occur which can change documentation meaning.  Final Clinical Impression(s) / ED Diagnoses Final diagnoses:  Allergic reaction, initial encounter    Rx / DC Orders ED Discharge Orders          Ordered    predniSONE (DELTASONE) 20 MG tablet  Daily        11/20/21 1726    EPINEPHrine 0.3 mg/0.3 mL IJ SOAJ injection   Once        11/20/21 1726              Crandall Harvel A, PA-C 11/21/21 2109    Wyvonnia Dusky, MD 11/22/21 1510

## 2021-11-20 NOTE — ED Triage Notes (Signed)
She c/o swelling of lips and urticaria on bilat. Arms and trunk. She states she had her hair braided recently and theorizes it might be chemicals to which she was exposed. She denies any tongue or throat swelling and is breathing normally.

## 2021-11-20 NOTE — Discharge Instructions (Addendum)
It was a pleasure taking care of you today!  You will be sent a prescription for EpiPen and prednisone, take as directed.  The attached instructions give directions on how to and when to use the EpiPen. You may take your prescription for hydroxyzine as needed for your symptoms. If you run out of this, then you may take benadryl as directed. You may follow-up with your primary care provider as needed.  Return to the emergency department if you are experiencing increasing/worsening chest tightness, shortness of breath, hives, or worsening symptoms.

## 2021-11-25 ENCOUNTER — Other Ambulatory Visit: Payer: Self-pay

## 2021-11-25 ENCOUNTER — Emergency Department (HOSPITAL_BASED_OUTPATIENT_CLINIC_OR_DEPARTMENT_OTHER)
Admission: EM | Admit: 2021-11-25 | Discharge: 2021-11-25 | Disposition: A | Discharge status: Home / Self Care | Payer: PRIVATE HEALTH INSURANCE | Attending: Emergency Medicine | Admitting: Emergency Medicine

## 2021-11-25 DIAGNOSIS — L500 Allergic urticaria: Secondary | ICD-10-CM | POA: Insufficient documentation

## 2021-11-25 DIAGNOSIS — R21 Rash and other nonspecific skin eruption: Secondary | ICD-10-CM | POA: Diagnosis present

## 2021-11-25 MED ORDER — DIPHENHYDRAMINE HCL 50 MG/ML IJ SOLN
25.0000 mg | Freq: Once | INTRAMUSCULAR | Status: AC
  Administered 2021-11-25: 25 mg via INTRAVENOUS
  Filled 2021-11-25: qty 1

## 2021-11-25 MED ORDER — TRIAMCINOLONE ACETONIDE 0.1 % EX CREA: 1.0000 | TOPICAL_CREAM | Freq: Two times a day (BID) | CUTANEOUS | 0 refills | Status: DC

## 2021-11-25 MED ORDER — METHYLPREDNISOLONE SODIUM SUCC 125 MG IJ SOLR
80.0000 mg | Freq: Once | INTRAMUSCULAR | Status: AC
  Administered 2021-11-25: 80 mg via INTRAVENOUS
  Filled 2021-11-25: qty 2

## 2021-11-25 MED ORDER — FAMOTIDINE IN NACL 20-0.9 MG/50ML-% IV SOLN
20.0000 mg | Freq: Once | INTRAVENOUS | Status: AC
  Administered 2021-11-25: 20 mg via INTRAVENOUS
  Filled 2021-11-25: qty 50

## 2021-11-25 NOTE — ED Notes (Signed)
Dc instructions reviewed with patient. Patient voiced understanding. Dc with belongings.  °

## 2021-11-25 NOTE — ED Provider Notes (Signed)
[facility] Provider Note   CSN: [id number] Arrival date & time: 11/25/21  0900     History  Chief Complaint  Patient presents with   Allergic Reaction   Rash    Victoria George is a 46 y.o. female.  Patient presents with urticaria.  She has a history of allergic reactions in the past.  She is recently been seen for an allergic reaction to some hair braids.  The braids have been removed.  She was initially seen on August 27 which was 4 days after she had had that her braids.  She had urticaria at that point and was given Decadron as well as a prescription for 20 mg of prednisone daily.  She had some worsening symptoms and was seen yesterday to urgent care.  She had a little bit of swelling to her upper lip at that time but no airway compromise.  She was given another injection of Decadron and her prednisone was increased to 40 mg a day for 5 days.  She said this morning she had some increased urticaria.  She does not have any facial swelling or swelling to her lips/tongue.  No shortness of breath.  She did not take her prednisone this morning.  She did take an dose of Atarax.  She forgot to start taking Pepcid.  She has previously been seen by an allergist since she called the office today to try to get an appointment but was told that she needs a referral given that it been 3 years since she was seen.       Home Medications Prior to Admission medications   Medication Sig Start Date End Date Taking? Authorizing Provider  triamcinolone cream (KENALOG) 0.1 % Apply 1 Application topically 2 (two) times daily. 11/25/21  Yes Malvin Johns, MD  albuterol (PROVENTIL HFA;VENTOLIN HFA) 108 (90 Base) MCG/ACT inhaler Inhale 2 puffs into the lungs every 6 (six) hours as needed for wheezing or shortness of breath. 02/28/17   Dara Hoyer, FNP  budesonide-formoterol (SYMBICORT) 80-4.5 MCG/ACT inhaler Inhale 2 puffs into the lungs every morning. Patient taking differently:  Inhale 2 puffs into the lungs as needed (asthma). 02/28/17   Dara Hoyer, FNP  hydrochlorothiazide (HYDRODIURIL) 25 MG tablet Take 1 tablet every day by oral route.    [provider]  Multiple Vitamin (MULTIVITAMIN) capsule Take 1 capsule by mouth daily.    [provider]  phentermine (ADIPEX-P) 37.5 MG tablet Take 1 tablet by mouth daily.    [provider]  predniSONE (DELTASONE) 20 MG tablet Take 2 tablets (40 mg total) by mouth daily for 5 days. 11/20/21 11/25/21  Blue, Soijett A, PA-C      Allergies    Phenergan [promethazine], Shellfish allergy, Acetaminophen-codeine, Bee pollen, Codeine, K-tab [potassium chloride er], Milk-related compounds, Other, Oxycodone, Tramadol, and Hydrocodone    Review of Systems   Review of Systems  Constitutional:  Negative for chills, diaphoresis, fatigue and fever.  HENT:  Negative for congestion, rhinorrhea and sneezing.   Eyes: Negative.   Respiratory:  Negative for cough, chest tightness and shortness of breath.   Cardiovascular:  Negative for chest pain and leg swelling.  Gastrointestinal:  Negative for abdominal pain, blood in stool, diarrhea, nausea and vomiting.  Genitourinary:  Negative for difficulty urinating, flank pain, frequency and hematuria.  Musculoskeletal:  Negative for arthralgias and back pain.  Skin:  Positive for rash.  Neurological:  Negative for dizziness, speech difficulty, weakness, numbness and headaches.  Physical Exam Updated Vital Signs BP (!) 143/90   Pulse 90   Temp (!) 97 F (36.1 C) (Oral)   Resp 20   Ht '5\' 1"'$  (1.549 m)   Wt 98.9 kg   SpO2 100%   BMI 41.19 kg/m  Physical Exam Constitutional:      Appearance: She is well-developed.  HENT:     Head: Normocephalic and atraumatic.     Mouth/Throat:     Comments: No visible angioedema Eyes:     Pupils: Pupils are equal, round, and reactive to light.  Cardiovascular:     Rate and Rhythm: Normal rate and regular rhythm.     Heart  sounds: Normal heart sounds.  Pulmonary:     Effort: Pulmonary effort is normal. No respiratory distress.     Breath sounds: Normal breath sounds. No wheezing or rales.  Chest:     Chest wall: No tenderness.  Abdominal:     General: Bowel sounds are normal.     Palpations: Abdomen is soft.     Tenderness: There is no abdominal tenderness. There is no guarding or rebound.  Musculoskeletal:        General: Normal range of motion.     Cervical back: Normal range of motion and neck supple.  Lymphadenopathy:     Cervical: No cervical adenopathy.  Skin:    General: Skin is warm and dry.     Findings: Rash (Diffuse urticarial type rash, it is blanching, no petechiae or purpura, no vesicles.) present.  Neurological:     Mental Status: She is alert and oriented to person, place, and time.     ED Results / Procedures / Treatments   Labs (all labs ordered are listed, but only abnormal results are displayed) Labs Reviewed - No data to display  EKG None  Radiology No results found.  Procedures Procedures    Medications Ordered in ED Medications  methylPREDNISolone sodium succinate (SOLU-MEDROL) 125 mg/2 mL injection 80 mg (80 mg Intravenous Given 11/25/21 1006)  famotidine (PEPCID) IVPB 20 mg premix (20 mg Intravenous New Bag/Given 11/25/21 1014)  diphenhydrAMINE (BENADRYL) injection 25 mg (25 mg Intravenous Given 11/25/21 1015)    ED Course/ Medical Decision Making/ A&P                           Medical Decision Making Risk Prescription drug management.   Patient is a 46 year old who presents with a rash.  This is consistent with urticaria.  She does not have any angioedema or airway compromise.  She is currently on prednisone, Atarax and Benadryl.  She is supposed to be starting Pepcid but has not done this yet.  She did not take her prednisone this morning so she was given a dose of Solu-Medrol in the ED as well as IV Benadryl and Pepcid.  She is feeling better after this.  She  still itchy but her rash has improved.  At this point she does not have any airway involvement and does not need inpatient hospitalizations or epinephrine.  I will go ahead and add some triamcinolone ointment to use to her rash as well.  She does not have it on her face.  She has previously seen an allergist and is going to try to get back in.  She has contacted her PCP regarding a referral.  She was encouraged to follow-up with her PCP or allergist.  Return precautions were given.  Final Clinical Impression(s) / ED Diagnoses  Final diagnoses:  Allergic urticaria    Rx / DC Orders ED Discharge Orders          Ordered    triamcinolone cream (KENALOG) 0.1 %  2 times daily        11/25/21 1127              Malvin Johns, MD 11/25/21 1129

## 2021-11-25 NOTE — ED Triage Notes (Addendum)
Patient arrives ambulatory to triage with complaints of worsening hives/rash & itching. Patient was seen here on Sunday for the same (allergic reaction started due to a reaction to synthetic hair-braids. Braids have been removed).   Patient states that she has received steroid injections from the ED and urgent care yesterday. She has also been taking her prescribed steroids and hydroxyzine with minimal relief. No shortness of breath or swelling to mouth.   Rates pain 3/10.

## 2021-12-15 ENCOUNTER — Emergency Department (HOSPITAL_COMMUNITY)
Admission: EM | Admit: 2021-12-15 | Discharge: 2021-12-15 | Disposition: A | Discharge status: Home / Self Care | Payer: PRIVATE HEALTH INSURANCE | Attending: Emergency Medicine | Admitting: Emergency Medicine

## 2021-12-15 ENCOUNTER — Other Ambulatory Visit: Payer: Self-pay

## 2021-12-15 ENCOUNTER — Emergency Department (HOSPITAL_COMMUNITY): Payer: PRIVATE HEALTH INSURANCE

## 2021-12-15 ENCOUNTER — Encounter (HOSPITAL_COMMUNITY): Payer: Self-pay | Admitting: Emergency Medicine

## 2021-12-15 DIAGNOSIS — S8992XA Unspecified injury of left lower leg, initial encounter: Secondary | ICD-10-CM | POA: Diagnosis present

## 2021-12-15 DIAGNOSIS — W19XXXA Unspecified fall, initial encounter: Secondary | ICD-10-CM | POA: Insufficient documentation

## 2021-12-15 DIAGNOSIS — Z7951 Long term (current) use of inhaled steroids: Secondary | ICD-10-CM | POA: Insufficient documentation

## 2021-12-15 DIAGNOSIS — Z79899 Other long term (current) drug therapy: Secondary | ICD-10-CM | POA: Diagnosis not present

## 2021-12-15 DIAGNOSIS — S82842A Displaced bimalleolar fracture of left lower leg, initial encounter for closed fracture: Secondary | ICD-10-CM | POA: Insufficient documentation

## 2021-12-15 DIAGNOSIS — Y92481 Parking lot as the place of occurrence of the external cause: Secondary | ICD-10-CM | POA: Diagnosis not present

## 2021-12-15 DIAGNOSIS — J45909 Unspecified asthma, uncomplicated: Secondary | ICD-10-CM | POA: Diagnosis not present

## 2021-12-15 DIAGNOSIS — I10 Essential (primary) hypertension: Secondary | ICD-10-CM | POA: Diagnosis not present

## 2021-12-15 MED ORDER — KETOROLAC TROMETHAMINE 15 MG/ML IJ SOLN
15.0000 mg | Freq: Once | INTRAMUSCULAR | Status: AC
  Administered 2021-12-15: 15 mg via INTRAVENOUS
  Filled 2021-12-15: qty 1

## 2021-12-15 MED ORDER — FENTANYL CITRATE PF 50 MCG/ML IJ SOSY
100.0000 ug | PREFILLED_SYRINGE | Freq: Once | INTRAMUSCULAR | Status: AC
  Administered 2021-12-15: 50 ug via INTRAVENOUS

## 2021-12-15 MED ORDER — HYDROMORPHONE HCL 2 MG PO TABS: 1.0000 mg | ORAL_TABLET | Freq: Four times a day (QID) | ORAL | 0 refills | Status: DC | PRN

## 2021-12-15 MED ORDER — HYDROMORPHONE HCL 1 MG/ML IJ SOLN
0.5000 mg | Freq: Once | INTRAMUSCULAR | Status: AC
  Administered 2021-12-15: 0.5 mg via INTRAVENOUS
  Filled 2021-12-15: qty 1

## 2021-12-15 MED ORDER — FENTANYL CITRATE PF 50 MCG/ML IJ SOSY
PREFILLED_SYRINGE | INTRAMUSCULAR | Status: AC
  Administered 2021-12-15: 50 ug
  Filled 2021-12-15: qty 2

## 2021-12-15 MED ORDER — NALOXONE HCL 4 MG/0.1ML NA LIQD: NASAL | 1 refills | Status: DC

## 2021-12-15 MED ORDER — HYDROMORPHONE HCL 1 MG/ML IJ SOLN
0.5000 mg | Freq: Once | INTRAMUSCULAR | Status: DC
  Filled 2021-12-15: qty 1

## 2021-12-15 NOTE — ED Triage Notes (Signed)
Pt BIB EMS from home, c/o leg pain. Pt reported steeping out of car onto gravel parking lot. Left ankle noted with swelling, c/o pain. Given 150 mcg Fentanyl and 4 mg zofran through 20 gauge LAC.   BP 140/76 HR 80 spO2 98% RA

## 2021-12-15 NOTE — ED Notes (Addendum)
100 mcg Fentanyl given at bedside for ankle fracture reduction. Procedure started at 53, MD and ortho tech at bedside. Vital signs noted in flowsheet.

## 2021-12-15 NOTE — Discharge Instructions (Addendum)
We evaluated your ankle pain in the emergency department.  Your testing showed a fracture of 2 bones in your ankle.  This will need a surgery.  We have put you in a splint, please keep the splint dry, do not put any weight on your foot.  Please follow-up with the orthopedic surgeon Dr. Kathaleen Bury.  Please call his office to schedule an appointment.  Please take Tylenol and Motrin for your symptoms at home.  You can take 650 mg of Tylenol every 6 hours and 600 mg of ibuprofen every 6 hours as needed for your symptoms.  You can take these medicines together as needed, either at the same time, or alternating every 3 hours.  If these medications do not resolve your symptoms, please take a very low dose of the Dilaudid medicine.  This is an opiate medicine so do not drink alcohol or drive while using this medicine.  Return to the emergency department if you develop any severe pain, numbness or tingling, color change in your foot, or any other concerning symptoms.

## 2021-12-15 NOTE — ED Provider Notes (Signed)
[facility] DEPT Provider Note  CSN: [id number] Arrival date & time: 12/15/21 1040  Chief Complaint(s) Fall  HPI Victoria George is a 46 y.o. female with history of asthma, hypertension presenting with left ankle pain.  Patient reports she was getting out of a car, twisted her ankle today, developed significant pain.  Patient called EMS who brought her here.  She denies any other injuries, did not hit her head.  Denies syncopal event.  No numbness or tingling other than mild at the site of swelling.  Not in the distal foot.   Past Medical History Past Medical History:  Diagnosis Date   Anemia 03/02/2011   History of gastritis    12/ 2012   History of ovarian cyst    HTN (hypertension) 03/02/2011   Hypertension    Iron deficiency anemia    Low serum potassium level    Moderate persistent asthma, uncomplicated    [facility]-- dr Nelva Bush   Seasonal allergic conjunctivitis    Seasonal and perennial allergic rhinitis    Status post primary low transverse cesarean section 03/02/2019   Submucous leiomyoma of uterus    Uterine fibroids affecting pregnancy in third trimester 03/02/2019   Wears glasses    Patient Active Problem List   Diagnosis Date Noted   Status post primary low transverse cesarean section 03/02/2019   Uterine fibroids affecting pregnancy in third trimester 03/02/2019   Chronic hypertension with superimposed preeclampsia 02/08/2019   Uterine fibroids affecting pregnancy 10/16/2018   Obesity affecting pregnancy, antepartum 10/06/2018   In vitro fertilization 10/05/2018   Chronic hypertension affecting pregnancy 10/03/2018   Supervision of high risk pregnancy, antepartum 10/03/2018   Seasonal and perennial allergic rhinitis 05/03/2017   Moderate persistent asthma without complication 84/69/6295   Seasonal allergic conjunctivitis 03/01/2017   Anaphylactic shock due to adverse food reaction 03/01/2017   Chronic urticaria  03/01/2017   Nausea and vomiting during pregnancy 03/02/2011   Home Medication(s) Prior to Admission medications   Medication Sig Start Date End Date Taking? Authorizing Provider  HYDROmorphone (DILAUDID) 2 MG tablet Take 0.5 tablets (1 mg total) by mouth every 6 (six) hours as needed for severe pain. 12/15/21  Yes Cristie Hem, MD  naloxone [facility]) nasal spray 4 mg/0.1 mL Use once in the nose as needed for decreased responsiveness 12/15/21  Yes Kaeson Kleinert, Livingston Diones, MD  albuterol (PROVENTIL HFA;VENTOLIN HFA) 108 (90 Base) MCG/ACT inhaler Inhale 2 puffs into the lungs every 6 (six) hours as needed for wheezing or shortness of breath. 02/28/17   Dara Hoyer, FNP  budesonide-formoterol (SYMBICORT) 80-4.5 MCG/ACT inhaler Inhale 2 puffs into the lungs every morning. Patient taking differently: Inhale 2 puffs into the lungs as needed (asthma). 02/28/17   Dara Hoyer, FNP  hydrochlorothiazide (HYDRODIURIL) 25 MG tablet Take 1 tablet every day by oral route.    [provider]  Multiple Vitamin (MULTIVITAMIN) capsule Take 1 capsule by mouth daily.    [provider]  phentermine (ADIPEX-P) 37.5 MG tablet Take 1 tablet by mouth daily.    [provider]  triamcinolone cream (KENALOG) 0.1 % Apply 1 Application topically 2 (two) times daily. 11/25/21   Malvin Johns, MD  Past Surgical History Past Surgical History:  Procedure Laterality Date   CARDIOVASCULAR STRESS TEST     CESAREAN SECTION N/A 03/02/2019   Procedure: CESAREAN SECTION;  Surgeon: Sherlyn Hay, DO;  Location: [facility];  Service: Obstetrics;  Laterality: N/A;   CHOLECYSTECTOMY  02/01/2011   Procedure: LAPAROSCOPIC CHOLECYSTECTOMY WITH INTRAOPERATIVE CHOLANGIOGRAM;  Surgeon: Odis Hollingshead, MD;  Location: [facility];  Service: General;  Laterality: N/A;  Needs IOC and C-arm    DIAGNOSTIC LAPAROSCOPY  07/2007   ?Lysis Adhesions for obstructive right fallopian tube   ESOPHAGOGASTRODUODENOSCOPY  01/30/2011   Procedure: ESOPHAGOGASTRODUODENOSCOPY (EGD);  Surgeon: Zenovia Jarred, MD;  Location: [facility];  Service: Gastroenterology;  Laterality: N/A;   ESOPHAGOGASTRODUODENOSCOPY  03/03/2011   Procedure: ESOPHAGOGASTRODUODENOSCOPY (EGD);  Surgeon: Zenovia Jarred, MD;  Location: [facility];  Service: Gastroenterology;  Laterality: N/A;   HYSTEROSCOPY N/A 03/22/2016   Procedure: HYSTEROSCOPY, POLYPECTOMY;  Surgeon: Governor Specking, MD;  Location: [facility];  Service: Gynecology;  Laterality: N/A;   HYSTEROSCOPY WITH RESECTOSCOPE N/A 09/19/2017   Procedure: HYSTEROSCOPY MYOMECTOMY;  Surgeon: Governor Specking, MD;  Location: [facility];  Service: Gynecology;  Laterality: N/A;   LAPAROSCOPIC GELPORT ASSISTED MYOMECTOMY N/A 03/22/2016   Procedure: LAPAROSCOPIC GELPORT ASSISTED MYOMECTOMY, LYSIS OF ADHESIONS, CHROMOPERTUBATION;  Surgeon: Governor Specking, MD;  Location: [facility];  Service: Gynecology;  Laterality: N/A;   TRANSTHORACIC ECHOCARDIOGRAM     Family History Family History  Problem Relation Age of Onset   Hypertension Mother    Colon cancer Maternal Grandmother    Diabetes Maternal Grandmother    Kidney disease Maternal Grandmother    Diabetes Brother    Diabetes Paternal Grandmother    Diverticulosis Paternal Grandmother    Anesthesia problems Neg Hx    Malignant hyperthermia Neg Hx    Pseudochol deficiency Neg Hx     Social History Social History   Tobacco Use   Smoking status: Never   Smokeless tobacco: Never  Vaping Use   Vaping Use: Never used  Substance Use Topics   Alcohol use: Not Currently    Comment: last drink 2 years ago   Drug use: No   Allergies Phenergan [promethazine], Shellfish allergy, Acetaminophen-codeine, Bee pollen, Codeine, K-tab [potassium chloride er], Milk-related compounds,  Other, Oxycodone, Tramadol, and Hydrocodone  Review of Systems Review of Systems  All other systems reviewed and are negative.   Physical Exam Vital Signs  I have reviewed the triage vital signs BP (!) 154/115   Pulse 95   Temp 97.6 F (36.4 C) (Oral)   Resp 18   Ht '5\' 1"'$  (1.549 m)   Wt 99.8 kg   SpO2 100%   BMI 41.57 kg/m  Physical Exam Vitals and nursing note reviewed.  Constitutional:      Appearance: Normal appearance.  HENT:     Head: Normocephalic and atraumatic.     Mouth/Throat:     Mouth: Mucous membranes are moist.  Eyes:     Conjunctiva/sclera: Conjunctivae normal.  Cardiovascular:     Rate and Rhythm: Normal rate.     Comments: 2+ bilateral DP pulses Pulmonary:     Effort: Pulmonary effort is normal. No respiratory distress.  Abdominal:     General: Abdomen is flat.  Musculoskeletal:     Comments: Significant soft tissue swelling to the left ankle anteriorly, no tenderness over the foot or proximal fibula  Skin:    General: Skin is warm and dry.     Capillary Refill: Capillary  refill takes less than 2 seconds.  Neurological:     General: No focal deficit present.     Mental Status: She is alert. Mental status is at baseline.     Comments: neurologically intact in the bilateral feet  Psychiatric:        Mood and Affect: Mood normal.        Behavior: Behavior normal.     ED Results and Treatments Labs (all labs ordered are listed, but only abnormal results are displayed) Labs Reviewed - No data to display                                                                                                                        Radiology CT Ankle Left Wo Contrast  Result Date: 12/15/2021 CLINICAL DATA:  Left ankle fracture. EXAM: CT OF THE LEFT ANKLE WITHOUT CONTRAST TECHNIQUE: Multidetector CT imaging of the left ankle was performed according to the standard protocol. Multiplanar CT image reconstructions were also generated. RADIATION DOSE REDUCTION:  This exam was performed according to the departmental dose-optimization program which includes automated exposure control, adjustment of the mA and/or kV according to patient size and/or use of iterative reconstruction technique. COMPARISON:  Left ankle x-rays from same day. FINDINGS: Bones/Joint/Cartilage Acute oblique fracture through the medial malleolus with up to 7 mm distraction. Acute oblique fracture of the distal fibular metadiaphysis with 2 mm lateral displacement. 5 mm posterior displacement, and minimal overriding. No additional fracture. Mild widening of the medial clear space with slight lateral subluxation of the talus with respect to the tibial plafond. The talar dome is intact. Joint spaces are preserved. No joint effusion. Ligaments Ligaments are suboptimally evaluated by CT. Muscles and Tendons Grossly intact. Soft tissue Lateral greater than medial hindfoot soft tissue swelling. No fluid collection or hematoma. No soft tissue mass. IMPRESSION: 1. Acute bimalleolar fracture as described above. Electronically Signed   By: Titus Dubin M.D.   On: 12/15/2021 14:47   DG Ankle Complete Left  Result Date: 12/15/2021 CLINICAL DATA:  ankle pain EXAM: LEFT ANKLE COMPLETE - 3+ VIEW COMPARISON:  Same day ankle radiograph. FINDINGS: Interval casting. There is lateral displacement of the talus relative to the tibia, but this is improved relative to the prior. Please see prior for description of fractures. IMPRESSION: Interval casting with improved alignment. Electronically Signed   By: Margaretha Sheffield M.D.   On: 12/15/2021 13:46   DG Foot Complete Left  Result Date: 12/15/2021 CLINICAL DATA:  Ankle pain EXAM: LEFT FOOT - COMPLETE 3+ VIEW COMPARISON:  None Available. FINDINGS: Ankle fracture-dislocation with fractures of the distal fibula into the base of the medial malleolus. Lateral displacement of the talus with respect to the tibia. Intact subtalar and talonavicular joints. Intact  calcaneocuboid joint. There is no evidence of additional fracture in the midfoot or forefoot. There is mild degenerative change of the first MTP and diffuse interphalangeal joints. Extensive ankle soft tissue swelling. Os peroneum. IMPRESSION: Ankle fracture-dislocation with fractures of  the distal fibula and through the base of the medial malleolus. Lateral displacement of the talus with respect to the tibia. No evidence of additional fracture in the midfoot or forefoot. Electronically Signed   By: Maurine Simmering M.D.   On: 12/15/2021 12:04   DG Tibia/Fibula Left  Result Date: 12/15/2021 CLINICAL DATA:  ankle pain EXAM: LEFT TIBIA AND FIBULA - 2 VIEW COMPARISON:  None Available. FINDINGS: Ankle fracture-dislocation as described on separately dictated ankle radiographs. There is no evidence of proximal tibia or fibula fracture. Extensive ankle soft tissue swelling. IMPRESSION: Ankle fracture-dislocation with distal fibular and medial malleolar fractures and lateral displacement of the talus with respect to the tibia. No fracture involving the proximal tibia or fibula. Electronically Signed   By: Maurine Simmering M.D.   On: 12/15/2021 12:02   DG Ankle Complete Left  Result Date: 12/15/2021 CLINICAL DATA:  Ankle pain EXAM: LEFT ANKLE COMPLETE - 3+ VIEW COMPARISON:  Radiograph 11/10/2014 FINDINGS: Ankle fracture dislocation with displaced obliquely oriented distal fibular fracture and displaced medial malleolar fracture. There is lateral displacement of the talus with respect to the tibia. The subtalar joint appears intact. The talonavicular joint appears intact. There is dense of ankle soft tissue swelling. IMPRESSION: Ankle fracture-dislocation with displaced fractures of the distal fibula and through the base of the medial malleolus, and lateral displacement of the talus with respect to the tibia. Electronically Signed   By: Maurine Simmering M.D.   On: 12/15/2021 12:00    Pertinent labs & imaging results that were  available during my care of the patient were reviewed by me and considered in my medical decision making (see MDM for details).  Medications Ordered in ED Medications  HYDROmorphone (DILAUDID) injection 0.5 mg ( Intravenous Canceled Entry 12/15/21 1332)  ketorolac (TORADOL) 15 MG/ML injection 15 mg (15 mg Intravenous Given 12/15/21 1116)  HYDROmorphone (DILAUDID) injection 0.5 mg (0.5 mg Intravenous Given 12/15/21 1116)  fentaNYL (SUBLIMAZE) injection 100 mcg (50 mcg Intravenous Given 12/15/21 1315)  HYDROmorphone (DILAUDID) injection 0.5 mg (0.5 mg Intravenous Given 12/15/21 1349)                                                                                                                                     Procedures .Ortho Injury Treatment  Date/Time: 12/15/2021 2:54 PM  Performed by: Cristie Hem, MD Authorized by: Cristie Hem, MD   Consent:    Consent obtained:  Verbal   Consent given by:  Patient   Risks discussed:  Restricted joint movement, vascular damage, nerve damage, recurrent dislocation, stiffness, irreducible dislocation and fracture   Alternatives discussed:  No treatment and immobilizationInjury location: ankle Location details: left ankle Injury type: fracture-dislocation Fracture type: bimalleolar Pre-procedure neurovascular assessment: neurovascularly intact Pre-procedure distal perfusion: normal Pre-procedure neurological function: normal Pre-procedure range of motion: reduced  Anesthesia: Local anesthesia used: no  Patient sedated: NoManipulation performed: yes Skin traction used: yes  Skeletal traction used: no Reduction successful: yes X-ray confirmed reduction: yes Immobilization: splint Splint type: ankle stirrup and short leg Splint Applied by: ED Provider and Ortho Tech Supplies used: Ortho-Glass Post-procedure neurovascular assessment: post-procedure neurovascularly intact Post-procedure distal perfusion: normal Post-procedure  neurological function: normal Post-procedure range of motion: normal     (including critical care time)  Medical Decision Making / ED Course   MDM:  46 year old female presenting to the emergency department after ankle injury.  We will obtain imaging given significant swelling and pain.  Differential includes fracture, dislocation, sprain.  Will treat pain and reassess.  Clinical Course as of 12/15/21 1455  Thu Dec 15, 2021  1237 Spoke with Dr  Kathaleen Bury who agrees with reduction and requests CT scan after reduction [WS]  1446 My interpretation, alignment appears improved after reduction.  Patient is allergic to hydrocodone and oxycodone.  Will prescribe a very small amount of Dilaudid for few days she has tolerated this medicine in the past. Reviewed PDMP, no prior narcotic rx in last 2 years. Will discharge patient to home. All questions answered. Patient comfortable with plan of discharge. Return precautions discussed with patient and specified on the after visit summary.  [WS]    Clinical Course User Index [WS] Cristie Hem, MD     Additional history obtained: -Additional history obtained from ems -External records from outside source obtained and reviewed including: Chart review including previous notes, labs, imaging, consultation notes   Lab Tests: -I ordered, reviewed, and interpreted labs.   The pertinent results include:   Labs Reviewed - No data to display    EKG   EKG Interpretation  Date/Time:    Ventricular Rate:    PR Interval:    QRS Duration:   QT Interval:    QTC Calculation:   R Axis:     Text Interpretation:           Imaging Studies ordered: I ordered imaging studies including XR ankle, CT ankle On my interpretation imaging demonstrates bimalleolar fx-dislocation, bimalleolar fx s/p reduction I independently visualized and interpreted imaging. I agree with the radiologist interpretation   Medicines ordered and prescription  drug management: Meds ordered this encounter  Medications   ketorolac (TORADOL) 15 MG/ML injection 15 mg   HYDROmorphone (DILAUDID) injection 0.5 mg   fentaNYL (SUBLIMAZE) 50 MCG/ML injection    [place], Danielle: cabinet override   fentaNYL (SUBLIMAZE) injection 100 mcg   HYDROmorphone (DILAUDID) injection 0.5 mg   HYDROmorphone (DILAUDID) injection 0.5 mg   HYDROmorphone (DILAUDID) 2 MG tablet    Sig: Take 0.5 tablets (1 mg total) by mouth every 6 (six) hours as needed for severe pain.    Dispense:  8 tablet    Refill:  0   naloxone (NARCAN) nasal spray 4 mg/0.1 mL    Sig: Use once in the nose as needed for decreased responsiveness    Dispense:  1 each    Refill:  1    -I have reviewed the patients home medicines and have made adjustments as needed   Consultations Obtained: I requested consultation with the orthopedist,  and discussed lab and imaging findings as well as pertinent plan - they recommend: splint and discharge to home   Cardiac Monitoring: The patient was maintained on a cardiac monitor.  I personally viewed and interpreted the cardiac monitored which showed an underlying rhythm of: NSR  Social Determinants of Health:  Factors impacting patients care include: obesity   Reevaluation: After the interventions noted above,  I reevaluated the patient and found that they have improved  Co morbidities that complicate the patient evaluation  Past Medical History:  Diagnosis Date   Anemia 03/02/2011   History of gastritis    12/ 2012   History of ovarian cyst    HTN (hypertension) 03/02/2011   Hypertension    Iron deficiency anemia    Low serum potassium level    Moderate persistent asthma, uncomplicated    allergy & asthma center-- dr Nelva Bush   Seasonal allergic conjunctivitis    Seasonal and perennial allergic rhinitis    Status post primary low transverse cesarean section 03/02/2019   Submucous leiomyoma of uterus    Uterine fibroids affecting  pregnancy in third trimester 03/02/2019   Wears glasses       Dispostion: Discharge    Final Clinical Impression(s) / ED Diagnoses Final diagnoses:  Closed bimalleolar fracture of left ankle, initial encounter     This chart was dictated using voice recognition software.  Despite best efforts to proofread,  errors can occur which can change the documentation meaning.    Cristie Hem, MD 12/15/21 [phone]

## 2021-12-15 NOTE — Progress Notes (Signed)
Orthopedic Tech Progress Note Patient Details:  Yeraldy Spike [facility] May 11, 1975 [id number]  Ortho Devices Type of Ortho Device: Short leg splint, Stirrup splint, Crutches Ortho Device/Splint Location: Left ankle Ortho Device/Splint Interventions: Application   Post Interventions Patient Tolerated: Well  Linus Salmons Aliya Sol 12/15/2021, 1:31 PM

## 2021-12-16 ENCOUNTER — Encounter (HOSPITAL_COMMUNITY): Payer: Self-pay

## 2021-12-16 ENCOUNTER — Other Ambulatory Visit: Payer: Self-pay

## 2021-12-16 ENCOUNTER — Emergency Department (HOSPITAL_COMMUNITY): Payer: PRIVATE HEALTH INSURANCE

## 2021-12-16 ENCOUNTER — Emergency Department (HOSPITAL_COMMUNITY)
Admission: EM | Admit: 2021-12-16 | Discharge: 2021-12-16 | Disposition: A | Discharge status: Home / Self Care | Payer: PRIVATE HEALTH INSURANCE | Attending: Emergency Medicine | Admitting: Emergency Medicine

## 2021-12-16 DIAGNOSIS — R2243 Localized swelling, mass and lump, lower limb, bilateral: Secondary | ICD-10-CM | POA: Diagnosis not present

## 2021-12-16 DIAGNOSIS — M25472 Effusion, left ankle: Secondary | ICD-10-CM

## 2021-12-16 DIAGNOSIS — S82842D Displaced bimalleolar fracture of left lower leg, subsequent encounter for closed fracture with routine healing: Secondary | ICD-10-CM

## 2021-12-16 DIAGNOSIS — Z79899 Other long term (current) drug therapy: Secondary | ICD-10-CM | POA: Diagnosis not present

## 2021-12-16 DIAGNOSIS — S82845D Nondisplaced bimalleolar fracture of left lower leg, subsequent encounter for closed fracture with routine healing: Secondary | ICD-10-CM | POA: Diagnosis not present

## 2021-12-16 DIAGNOSIS — S8992XD Unspecified injury of left lower leg, subsequent encounter: Secondary | ICD-10-CM | POA: Diagnosis present

## 2021-12-16 DIAGNOSIS — J45909 Unspecified asthma, uncomplicated: Secondary | ICD-10-CM | POA: Insufficient documentation

## 2021-12-16 DIAGNOSIS — Z7951 Long term (current) use of inhaled steroids: Secondary | ICD-10-CM | POA: Diagnosis not present

## 2021-12-16 DIAGNOSIS — X58XXXD Exposure to other specified factors, subsequent encounter: Secondary | ICD-10-CM | POA: Diagnosis not present

## 2021-12-16 DIAGNOSIS — I10 Essential (primary) hypertension: Secondary | ICD-10-CM | POA: Insufficient documentation

## 2021-12-16 MED ORDER — HYDROMORPHONE HCL 2 MG PO TABS: 1.0000 mg | ORAL_TABLET | ORAL | Status: DC | PRN

## 2021-12-16 MED ORDER — ONDANSETRON 4 MG PO TBDP: 4.0000 mg | ORAL_TABLET | Freq: Once | ORAL | Status: DC

## 2021-12-16 NOTE — ED Triage Notes (Signed)
Patient states she had an ankle fracture in 2 places and patient states she was told to come back to the ED if she was experiencing swelling or tingling which she now has. Ortho has not contacted the patient for an appointment.

## 2021-12-16 NOTE — ED Provider Notes (Addendum)
[facility] DEPT Provider Note   CSN: [id number] Arrival date & time: 12/16/21  5176     History  Chief Complaint  Patient presents with   Foot Swelling    Victoria George is a 46 y.o. female with asthma, HTN presents with ankle problem.   Left patient was seen in the emergency department on 12/15/2021 when she twisted her ankle getting out of the car.  Imaging demonstrated a left bimalleolar fracture dislocation which was reduced and patient was discharged with plan for Ortho follow-up. Patient notes that her numbness/tingling improved after it was relocated. Discharged in a short leg splint. After she was discharged however, her numbness/tingling returned on the top of her foot radiating down to her toes and she states it feels as though her ankle is dislocated again, it feels "funny" and out of place, similar to how it felt prior to being relocated. Last took dilaudid PO at 0300 AM.  Denies any significant swelling.  No fever/chills.  Has not taken off the splint.  No further trauma.  HPI     Home Medications Prior to Admission medications   Medication Sig Start Date End Date Taking? Authorizing Provider  albuterol (PROVENTIL HFA;VENTOLIN HFA) 108 (90 Base) MCG/ACT inhaler Inhale 2 puffs into the lungs every 6 (six) hours as needed for wheezing or shortness of breath. 02/28/17   Dara Hoyer, FNP  budesonide-formoterol (SYMBICORT) 80-4.5 MCG/ACT inhaler Inhale 2 puffs into the lungs every morning. Patient taking differently: Inhale 2 puffs into the lungs as needed (asthma). 02/28/17   Dara Hoyer, FNP  hydrochlorothiazide (HYDRODIURIL) 25 MG tablet Take 1 tablet every day by oral route.    [provider]  HYDROmorphone (DILAUDID) 2 MG tablet Take 0.5 tablets (1 mg total) by mouth every 6 (six) hours as needed for severe pain. 12/15/21   Cristie Hem, MD  Multiple Vitamin (MULTIVITAMIN) capsule Take 1 capsule by mouth daily.    [provider]  naloxone [facility]) nasal spray 4 mg/0.1 mL Use once in the nose as needed for decreased responsiveness 12/15/21   Cristie Hem, MD  phentermine (ADIPEX-P) 37.5 MG tablet Take 1 tablet by mouth daily.    [provider]  triamcinolone cream (KENALOG) 0.1 % Apply 1 Application topically 2 (two) times daily. 11/25/21   Malvin Johns, MD      Allergies    Phenergan [promethazine], Shellfish allergy, Acetaminophen-codeine, Bee pollen, Codeine, K-tab [potassium chloride er], Milk-related compounds, Other, Oxycodone, Tramadol, and Hydrocodone    Review of Systems   Review of Systems Review of systems negative for swelling.  A 10 point review of systems was performed and is negative unless otherwise reported in HPI.  Physical Exam Updated Vital Signs BP 133/89 (BP Location: Left Arm)   Pulse 96   Temp 97.7 F (36.5 C) (Oral)   Resp 18   Ht '5\' 1"'$  (1.549 m)   Wt 99.8 kg   SpO2 100%   BMI 41.57 kg/m  Physical Exam General: Normal appearing female, lying in bed.  HEENT: PERRLA, Sclera anicteric, MMM, trachea midline. Cardiology: RRR, no murmurs/rubs/gallops. BL radial and DP pulses equal bilaterally.  Resp: Normal respiratory rate and effort. CTAB, no wheezes, rhonchi, crackles.  Abd: Soft, non-tender, non-distended. No rebound tenderness or guarding.  GU: Deferred. MSK: LLE in short leg splint. Intact DP pulse palpable. Decreased sensation over dorsum of L foot. No significant swelling, erythema.  Intact ROM of toes. No peripheral edema or  signs of trauma. Extremities without deformity or TTP. No cyanosis or clubbing. Skin: warm, dry. No rashes or lesions. Back: No CVA tenderness Neuro: A&Ox4, CNs II-XII grossly intact. MAEs. Sensation grossly intact.  Psych: Normal mood and affect.   ED Results / Procedures / Treatments   Labs (all labs ordered are listed, but only abnormal results are displayed) Labs Reviewed - No data to  display  EKG None  Radiology DG Ankle Complete Left  Result Date: 12/16/2021 CLINICAL DATA:  Bimalleolar ankle fracture EXAM: LEFT ANKLE COMPLETE - 3+ VIEW COMPARISON:  12/15/2021 FINDINGS: Slight improved alignment of the left ankle bimalleolar fractures. Slight widening of the ankle joint space. Overlying casting material noted. On the lateral view there is some residual displacement of the malleolar fractures appreciated. Talus and calcaneus appear intact. IMPRESSION: Bimalleolar ankle fractures.  Slight improved alignment. Electronically Signed   By: Jerilynn Mages.  Shick M.D.   On: 12/16/2021 10:44   CT Ankle Left Wo Contrast  Result Date: 12/15/2021 CLINICAL DATA:  Left ankle fracture. EXAM: CT OF THE LEFT ANKLE WITHOUT CONTRAST TECHNIQUE: Multidetector CT imaging of the left ankle was performed according to the standard protocol. Multiplanar CT image reconstructions were also generated. RADIATION DOSE REDUCTION: This exam was performed according to the departmental dose-optimization program which includes automated exposure control, adjustment of the mA and/or kV according to patient size and/or use of iterative reconstruction technique. COMPARISON:  Left ankle x-rays from same day. FINDINGS: Bones/Joint/Cartilage Acute oblique fracture through the medial malleolus with up to 7 mm distraction. Acute oblique fracture of the distal fibular metadiaphysis with 2 mm lateral displacement. 5 mm posterior displacement, and minimal overriding. No additional fracture. Mild widening of the medial clear space with slight lateral subluxation of the talus with respect to the tibial plafond. The talar dome is intact. Joint spaces are preserved. No joint effusion. Ligaments Ligaments are suboptimally evaluated by CT. Muscles and Tendons Grossly intact. Soft tissue Lateral greater than medial hindfoot soft tissue swelling. No fluid collection or hematoma. No soft tissue mass. IMPRESSION: 1. Acute bimalleolar fracture as  described above. Electronically Signed   By: Titus Dubin M.D.   On: 12/15/2021 14:47   DG Ankle Complete Left  Result Date: 12/15/2021 CLINICAL DATA:  ankle pain EXAM: LEFT ANKLE COMPLETE - 3+ VIEW COMPARISON:  Same day ankle radiograph. FINDINGS: Interval casting. There is lateral displacement of the talus relative to the tibia, but this is improved relative to the prior. Please see prior for description of fractures. IMPRESSION: Interval casting with improved alignment. Electronically Signed   By: Margaretha Sheffield M.D.   On: 12/15/2021 13:46   DG Foot Complete Left  Result Date: 12/15/2021 CLINICAL DATA:  Ankle pain EXAM: LEFT FOOT - COMPLETE 3+ VIEW COMPARISON:  None Available. FINDINGS: Ankle fracture-dislocation with fractures of the distal fibula into the base of the medial malleolus. Lateral displacement of the talus with respect to the tibia. Intact subtalar and talonavicular joints. Intact calcaneocuboid joint. There is no evidence of additional fracture in the midfoot or forefoot. There is mild degenerative change of the first MTP and diffuse interphalangeal joints. Extensive ankle soft tissue swelling. Os peroneum. IMPRESSION: Ankle fracture-dislocation with fractures of the distal fibula and through the base of the medial malleolus. Lateral displacement of the talus with respect to the tibia. No evidence of additional fracture in the midfoot or forefoot. Electronically Signed   By: Maurine Simmering M.D.   On: 12/15/2021 12:04   DG Tibia/Fibula Left  Result  Date: 12/15/2021 CLINICAL DATA:  ankle pain EXAM: LEFT TIBIA AND FIBULA - 2 VIEW COMPARISON:  None Available. FINDINGS: Ankle fracture-dislocation as described on separately dictated ankle radiographs. There is no evidence of proximal tibia or fibula fracture. Extensive ankle soft tissue swelling. IMPRESSION: Ankle fracture-dislocation with distal fibular and medial malleolar fractures and lateral displacement of the talus with respect to  the tibia. No fracture involving the proximal tibia or fibula. Electronically Signed   By: Maurine Simmering M.D.   On: 12/15/2021 12:02   DG Ankle Complete Left  Result Date: 12/15/2021 CLINICAL DATA:  Ankle pain EXAM: LEFT ANKLE COMPLETE - 3+ VIEW COMPARISON:  Radiograph 11/10/2014 FINDINGS: Ankle fracture dislocation with displaced obliquely oriented distal fibular fracture and displaced medial malleolar fracture. There is lateral displacement of the talus with respect to the tibia. The subtalar joint appears intact. The talonavicular joint appears intact. There is dense of ankle soft tissue swelling. IMPRESSION: Ankle fracture-dislocation with displaced fractures of the distal fibula and through the base of the medial malleolus, and lateral displacement of the talus with respect to the tibia. Electronically Signed   By: Maurine Simmering M.D.   On: 12/15/2021 12:00    Procedures Procedures    Medications Ordered in ED Medications  HYDROmorphone (DILAUDID) tablet 1 mg (has no administration in time range)  ondansetron (ZOFRAN-ODT) disintegrating tablet 4 mg (has no administration in time range)    ED Course/ Medical Decision Making/ A&P                          Medical Decision Making Amount and/or Complexity of Data Reviewed Radiology: ordered.  Risk Prescription drug management.    Patient with recurrent numbness tingling/decree sensation of the dorsum of her left foot as she experienced yesterday while her foot was dislocated.  Patient has remained in the short leg splint since that time but states that it feels "off" again.  It had originally felt improved after the relocation but states the feeling of dislocation has recurred.  Patient has remained in the short leg splint and has not removed it, there is no significant swelling, patient can move her toes.  Consider possible recurrent dislocation will obtain x-rays.  Calf is soft, good distal pulses and the foot is warm, no c/f compartment  syndrome.  I have personally reviewed and interpreted all imaging.   X-rays demonstrate good alignment of the ankle.  Called [facility] and discussed with the PA who stated that the numbness tingling on the dorsum of the foot is common and associated with increased swelling of the ankle.  Instructed patient to aggressively elevate the extremity to help prevent that swelling.  Patient has intact pulses, overall very well-appearing.  Patient be discharged with instructions to elevate, Tylenol and ibuprofen for pain management as well as the Dilaudid prescribed yesterday and follow-up with [facility] in clinic as originally planned.  Dispo: DC with discharge instructions and return precautions, all questions answered patient's satisfaction.          Final Clinical Impression(s) / ED Diagnoses Final diagnoses:  Left ankle swelling  Closed bimalleolar fracture of left ankle with routine healing, subsequent encounter    Rx / DC Orders ED Discharge Orders     None        This note was created using dictation software, which may contain spelling or grammatical errors.      Audley Hose, MD 12/16/21 1210

## 2021-12-16 NOTE — Discharge Instructions (Addendum)
Thank you for coming to [facility] Emergency Department. You were seen for ankle fracture with some numbness/tingling. We did an exam, and imaging, and these showed  no acute findings.  Please follow up with [facility] as originally planned. Please aggressively elevated your leg to help prevent the swelling and subsequent numbness tingling sensation on the top of the foot.  Do not hesitate to return to the ED or call 911 if you experience: -Worsening symptoms -Lightheadedness, passing out -Fevers/chills -Anything else that concerns you

## 2021-12-18 ENCOUNTER — Emergency Department (HOSPITAL_BASED_OUTPATIENT_CLINIC_OR_DEPARTMENT_OTHER)
Admission: EM | Admit: 2021-12-18 | Discharge: 2021-12-18 | Discharge status: Absent without leave | Payer: PRIVATE HEALTH INSURANCE

## 2021-12-20 NOTE — Discharge Instructions (Addendum)
Victoria Ramanathan, MD [facility]  Please read the following information regarding your care after surgery.  Medications  You only need a prescription for the narcotic pain medicine (ex. oxycodone, Percocet, Norco).  All of the other medicines listed below are available over the counter. ? Aleve 2 pills twice a day for the first 3 days after surgery. ? acetominophen (Tylenol) 650 mg every 4-6 hours as you need for minor to moderate pain ? oxycodone as prescribed for severe pain  ? To help prevent blood clots, take aspirin (81 mg) twice daily for 42 days after surgery (or total duration of nonweightbearing).  You should also get up every hour while you are awake to move around.  Weight Bearing ? Do NOT bear any weight on the operated leg or foot. This means do NOT touch your surgical leg to the ground!  Cast / Splint / Dressing ? If you have a splint, do NOT remove this. Keep your splint, cast or dressing clean and dry.  Don't put anything (coat hanger, pencil, etc) down inside of it.  If it gets wet, call the office immediately to schedule an appointment for a cast change.  Swelling IMPORTANT: It is normal for you to have swelling where you had surgery. To reduce swelling and pain, keep at least 3 pillows under your leg so that your toes are above your nose and your heel is above the level of your hip.  It may be necessary to keep your foot or leg elevated for several weeks.  This is critical to helping your incisions heal and your pain to feel better.  Follow Up Call my office at [phone] when you are discharged from the hospital or surgery center to schedule an appointment to be seen 7-10 days after surgery.  Call my office at [phone] if you develop a fever >101.5 F, nausea, vomiting, bleeding from the surgical site or severe pain.     

## 2021-12-27 ENCOUNTER — Encounter (HOSPITAL_COMMUNITY): Payer: Self-pay | Admitting: Orthopaedic Surgery

## 2021-12-27 ENCOUNTER — Other Ambulatory Visit: Payer: Self-pay

## 2021-12-27 NOTE — Progress Notes (Signed)
PCP - Selina Cooley, FNP Cardiologist - Dr Oswaldo Milian  (OV x 1 on 12/22/21)  Chest x-ray - 03/31/21 EKG - 10/31/21 Stress Test - greater than 5 yrs ECHO - 11/11/21 Cardiac Cath - n/a  ICD Pacemaker/Loop - n/a  Sleep Study -  n/a CPAP - none  STOP now taking any Aspirin (unless otherwise instructed by your surgeon), Aleve, Naproxen, Ibuprofen, Motrin, Advil, Goody's, BC's, all herbal medications, fish oil, and all vitamins.   Coronavirus Screening Do you have any of the following symptoms:  Cough yes/no: No Fever (>100.65F)  yes/no: No Runny nose yes/no: No Sore throat yes/no: No Difficulty breathing/shortness of breath  yes/no: No  Have you traveled in the last 14 days and where? yes/no: No  Patient verbalized understanding of instructions that were given via phone.

## 2021-12-28 ENCOUNTER — Encounter (HOSPITAL_COMMUNITY): Payer: Self-pay | Admitting: Orthopaedic Surgery

## 2021-12-28 ENCOUNTER — Ambulatory Visit (HOSPITAL_COMMUNITY)
Admission: RE | Admit: 2021-12-28 | Discharge: 2021-12-28 | Disposition: A | Discharge status: Home / Self Care | Payer: PRIVATE HEALTH INSURANCE | Attending: Orthopaedic Surgery | Admitting: Orthopaedic Surgery

## 2021-12-28 ENCOUNTER — Other Ambulatory Visit: Payer: Self-pay

## 2021-12-28 ENCOUNTER — Encounter (HOSPITAL_COMMUNITY)
Admission: RE | Disposition: A | Discharge status: Home / Self Care | Payer: Self-pay | Source: Home / Self Care | Attending: Orthopaedic Surgery

## 2021-12-28 ENCOUNTER — Ambulatory Visit (HOSPITAL_COMMUNITY): Payer: PRIVATE HEALTH INSURANCE

## 2021-12-28 ENCOUNTER — Ambulatory Visit (HOSPITAL_BASED_OUTPATIENT_CLINIC_OR_DEPARTMENT_OTHER): Payer: PRIVATE HEALTH INSURANCE | Admitting: Anesthesiology

## 2021-12-28 ENCOUNTER — Ambulatory Visit (HOSPITAL_COMMUNITY): Payer: PRIVATE HEALTH INSURANCE | Admitting: Anesthesiology

## 2021-12-28 DIAGNOSIS — S82842A Displaced bimalleolar fracture of left lower leg, initial encounter for closed fracture: Secondary | ICD-10-CM

## 2021-12-28 DIAGNOSIS — Z6841 Body Mass Index (BMI) 40.0 and over, adult: Secondary | ICD-10-CM

## 2021-12-28 DIAGNOSIS — J449 Chronic obstructive pulmonary disease, unspecified: Secondary | ICD-10-CM | POA: Insufficient documentation

## 2021-12-28 DIAGNOSIS — S93431A Sprain of tibiofibular ligament of right ankle, initial encounter: Secondary | ICD-10-CM | POA: Insufficient documentation

## 2021-12-28 DIAGNOSIS — X501XXA Overexertion from prolonged static or awkward postures, initial encounter: Secondary | ICD-10-CM | POA: Insufficient documentation

## 2021-12-28 DIAGNOSIS — Z79899 Other long term (current) drug therapy: Secondary | ICD-10-CM | POA: Insufficient documentation

## 2021-12-28 DIAGNOSIS — I10 Essential (primary) hypertension: Secondary | ICD-10-CM | POA: Diagnosis not present

## 2021-12-28 HISTORY — PX: ORIF ANKLE FRACTURE: SHX5408

## 2021-12-28 HISTORY — PX: SYNDESMOSIS REPAIR: SHX5182

## 2021-12-28 HISTORY — PX: ALLOGRAFT APPLICATION: SHX6404

## 2021-12-28 LAB — BASIC METABOLIC PANEL
Anion gap: 7 (ref 5–15)
BUN: 13 mg/dL (ref 6–20)
CO2: 29 mmol/L (ref 22–32)
Calcium: 9.4 mg/dL (ref 8.9–10.3)
Chloride: 101 mmol/L (ref 98–111)
Creatinine, Ser: 0.9 mg/dL (ref 0.44–1.00)
GFR, Estimated: >60 mL/min (ref >60)
Glucose, Bld: 92 mg/dL (ref 70–99)
Potassium: 3.5 mmol/L (ref 3.5–5.1)
Sodium: 137 mmol/L (ref 135–145)

## 2021-12-28 LAB — CBC
HCT: 39.5 % (ref 36.0–46.0)
Hemoglobin: 13 g/dL (ref 12.0–15.0)
MCH: 30.4 pg (ref 26.0–34.0)
MCHC: 32.9 g/dL (ref 30.0–36.0)
MCV: 92.5 fL (ref 80.0–100.0)
Platelets: 366 10*3/uL (ref 150–400)
RBC: 4.27 MIL/uL (ref 3.87–5.11)
RDW: 13.8 % (ref 11.5–15.5)
WBC: 4.4 10*3/uL (ref 4.0–10.5)
nRBC: 0 % (ref 0.0–0.2)

## 2021-12-28 LAB — POCT PREGNANCY, URINE: Preg Test, Ur: NEGATIVE

## 2021-12-28 LAB — SURGICAL PCR SCREEN
MRSA, PCR: NEGATIVE
Staphylococcus aureus: NEGATIVE

## 2021-12-28 SURGERY — OPEN REDUCTION INTERNAL FIXATION (ORIF) ANKLE FRACTURE
Anesthesia: General | Site: Ankle | Laterality: Left

## 2021-12-28 MED ORDER — LIDOCAINE 2% (20 MG/ML) 5 ML SYRINGE
INTRAMUSCULAR | Status: AC
  Filled 2021-12-28: qty 5

## 2021-12-28 MED ORDER — ACETAMINOPHEN 500 MG PO TABS
1000.0000 mg | ORAL_TABLET | Freq: Once | ORAL | Status: AC
  Administered 2021-12-28: 500 mg via ORAL
  Filled 2021-12-28: qty 2

## 2021-12-28 MED ORDER — HYDROMORPHONE HCL 1 MG/ML IJ SOLN
0.2500 mg | INTRAMUSCULAR | Status: DC | PRN
  Administered 2021-12-28 (×2): 0.5 mg via INTRAVENOUS

## 2021-12-28 MED ORDER — HYDROMORPHONE HCL 1 MG/ML IJ SOLN
INTRAMUSCULAR | Status: AC
  Administered 2021-12-28: 0.5 mg via INTRAVENOUS
  Filled 2021-12-28: qty 1

## 2021-12-28 MED ORDER — HYDROMORPHONE HCL 1 MG/ML IJ SOLN
INTRAMUSCULAR | Status: AC
  Filled 2021-12-28: qty 1

## 2021-12-28 MED ORDER — FENTANYL CITRATE (PF) 100 MCG/2ML IJ SOLN: 100.0000 ug | Freq: Once | INTRAMUSCULAR | Status: AC

## 2021-12-28 MED ORDER — CHLORHEXIDINE GLUCONATE 0.12 % MT SOLN
15.0000 mL | Freq: Once | OROMUCOSAL | Status: AC
  Administered 2021-12-28: 15 mL via OROMUCOSAL
  Filled 2021-12-28: qty 15

## 2021-12-28 MED ORDER — MIDAZOLAM HCL 2 MG/2ML IJ SOLN: 2.0000 mg | Freq: Once | INTRAMUSCULAR | Status: AC

## 2021-12-28 MED ORDER — DEXAMETHASONE SODIUM PHOSPHATE 10 MG/ML IJ SOLN
INTRAMUSCULAR | Status: AC
  Filled 2021-12-28: qty 1

## 2021-12-28 MED ORDER — ORAL CARE MOUTH RINSE: 15.0000 mL | Freq: Once | OROMUCOSAL | Status: AC

## 2021-12-28 MED ORDER — PROPOFOL 10 MG/ML IV BOLUS
INTRAVENOUS | Status: AC
  Filled 2021-12-28: qty 20

## 2021-12-28 MED ORDER — BUPIVACAINE-EPINEPHRINE (PF) 0.5% -1:200000 IJ SOLN
INTRAMUSCULAR | Status: DC | PRN
  Administered 2021-12-28: 10 mL
  Administered 2021-12-28: 20 mL

## 2021-12-28 MED ORDER — LIDOCAINE 2% (20 MG/ML) 5 ML SYRINGE
INTRAMUSCULAR | Status: DC | PRN
  Administered 2021-12-28: 40 mg via INTRAVENOUS

## 2021-12-28 MED ORDER — DEXAMETHASONE SODIUM PHOSPHATE 10 MG/ML IJ SOLN
INTRAMUSCULAR | Status: DC | PRN
  Administered 2021-12-28: 5 mg via INTRAVENOUS

## 2021-12-28 MED ORDER — ESMOLOL HCL 100 MG/10ML IV SOLN
INTRAVENOUS | Status: DC | PRN
  Administered 2021-12-28 (×3): 20 mg via INTRAVENOUS
  Administered 2021-12-28: 30 mg via INTRAVENOUS

## 2021-12-28 MED ORDER — ROCURONIUM BROMIDE 10 MG/ML (PF) SYRINGE
PREFILLED_SYRINGE | INTRAVENOUS | Status: DC | PRN
  Administered 2021-12-28: 20 mg via INTRAVENOUS
  Administered 2021-12-28: 60 mg via INTRAVENOUS

## 2021-12-28 MED ORDER — MIDAZOLAM HCL 2 MG/2ML IJ SOLN: 0.5000 mg | Freq: Once | INTRAMUSCULAR | Status: DC | PRN

## 2021-12-28 MED ORDER — LACTATED RINGERS IV SOLN: INTRAVENOUS | Status: DC

## 2021-12-28 MED ORDER — LABETALOL HCL 5 MG/ML IV SOLN
INTRAVENOUS | Status: DC | PRN
  Administered 2021-12-28: 5 mg via INTRAVENOUS

## 2021-12-28 MED ORDER — ONDANSETRON HCL 4 MG/2ML IJ SOLN
INTRAMUSCULAR | Status: DC | PRN
  Administered 2021-12-28: 4 mg via INTRAVENOUS

## 2021-12-28 MED ORDER — VANCOMYCIN HCL 500 MG IV SOLR
INTRAVENOUS | Status: DC | PRN
  Administered 2021-12-28: 500 mg

## 2021-12-28 MED ORDER — SODIUM CHLORIDE 0.9 % IR SOLN
Status: DC | PRN
  Administered 2021-12-28: 1000 mL

## 2021-12-28 MED ORDER — MUPIROCIN 2 % EX OINT
1.0000 | TOPICAL_OINTMENT | Freq: Once | CUTANEOUS | Status: AC
  Administered 2021-12-28: 1 via TOPICAL
  Filled 2021-12-28: qty 22

## 2021-12-28 MED ORDER — VANCOMYCIN HCL 500 MG IV SOLR
INTRAVENOUS | Status: AC
  Filled 2021-12-28: qty 10

## 2021-12-28 MED ORDER — FENTANYL CITRATE (PF) 250 MCG/5ML IJ SOLN
INTRAMUSCULAR | Status: DC | PRN
  Administered 2021-12-28: 50 ug via INTRAVENOUS
  Administered 2021-12-28: 100 ug via INTRAVENOUS
  Administered 2021-12-28 (×2): 50 ug via INTRAVENOUS

## 2021-12-28 MED ORDER — FENTANYL CITRATE (PF) 100 MCG/2ML IJ SOLN
INTRAMUSCULAR | Status: AC
  Administered 2021-12-28: 100 ug via INTRAVENOUS
  Filled 2021-12-28: qty 2

## 2021-12-28 MED ORDER — BUPIVACAINE-EPINEPHRINE (PF) 0.5% -1:200000 IJ SOLN
INTRAMUSCULAR | Status: DC | PRN
  Administered 2021-12-28: 10 mL via PERINEURAL

## 2021-12-28 MED ORDER — MIDAZOLAM HCL 2 MG/2ML IJ SOLN
INTRAMUSCULAR | Status: AC
  Administered 2021-12-28: 2 mg via INTRAVENOUS
  Filled 2021-12-28: qty 2

## 2021-12-28 MED ORDER — SCOPOLAMINE 1 MG/3DAYS TD PT72
1.0000 | MEDICATED_PATCH | TRANSDERMAL | Status: DC
  Administered 2021-12-28: 1.5 mg via TRANSDERMAL
  Filled 2021-12-28: qty 1

## 2021-12-28 MED ORDER — CEFAZOLIN SODIUM-DEXTROSE 2-4 GM/100ML-% IV SOLN
2.0000 g | INTRAVENOUS | Status: AC
  Administered 2021-12-28: 2 g via INTRAVENOUS
  Filled 2021-12-28: qty 100

## 2021-12-28 MED ORDER — FENTANYL CITRATE (PF) 250 MCG/5ML IJ SOLN
INTRAMUSCULAR | Status: AC
  Filled 2021-12-28: qty 5

## 2021-12-28 MED ORDER — PROMETHAZINE HCL 25 MG/ML IJ SOLN: 6.2500 mg | INTRAMUSCULAR | Status: DC | PRN

## 2021-12-28 MED ORDER — OXYCODONE HCL 5 MG PO TABS: 5.0000 mg | ORAL_TABLET | Freq: Once | ORAL | Status: AC | PRN

## 2021-12-28 MED ORDER — MEPERIDINE HCL 25 MG/ML IJ SOLN: 6.2500 mg | INTRAMUSCULAR | Status: DC | PRN

## 2021-12-28 MED ORDER — OXYCODONE HCL 5 MG PO TABS
ORAL_TABLET | ORAL | Status: AC
  Administered 2021-12-28: 5 mg via ORAL
  Filled 2021-12-28: qty 1

## 2021-12-28 MED ORDER — SUGAMMADEX SODIUM 200 MG/2ML IV SOLN
INTRAVENOUS | Status: DC | PRN
  Administered 2021-12-28 (×3): 100 mg via INTRAVENOUS

## 2021-12-28 MED ORDER — OXYCODONE HCL 5 MG/5ML PO SOLN: 5.0000 mg | Freq: Once | ORAL | Status: AC | PRN

## 2021-12-28 MED ORDER — ESMOLOL HCL 100 MG/10ML IV SOLN
INTRAVENOUS | Status: AC
  Filled 2021-12-28: qty 10

## 2021-12-28 MED ORDER — ONDANSETRON HCL 4 MG/2ML IJ SOLN
INTRAMUSCULAR | Status: AC
  Filled 2021-12-28: qty 2

## 2021-12-28 MED ORDER — PROPOFOL 10 MG/ML IV BOLUS
INTRAVENOUS | Status: DC | PRN
  Administered 2021-12-28: 50 mg via INTRAVENOUS
  Administered 2021-12-28: 40 mg via INTRAVENOUS
  Administered 2021-12-28: 150 mg via INTRAVENOUS

## 2021-12-28 MED ORDER — ROCURONIUM BROMIDE 10 MG/ML (PF) SYRINGE
PREFILLED_SYRINGE | INTRAVENOUS | Status: AC
  Filled 2021-12-28: qty 10

## 2021-12-28 SURGICAL SUPPLY — 82 items
ANCHOR SUT KEITH ABD SZ2 STR (SUTURE) ×1 IMPLANT
BANDAGE ESMARK 6X9 LF (GAUZE/BANDAGES/DRESSINGS) ×1 IMPLANT
BIT DRILL 2.0 LONG 140 (BIT) IMPLANT
BIT DRILL 2.5X2.75 QC CALB (BIT) IMPLANT
BIT DRILL SOLID 2.0 X 110MM (DRILL) IMPLANT
BLADE SURG 15 STRL LF DISP TIS (BLADE) ×2 IMPLANT
BLADE SURG 15 STRL SS (BLADE) ×2
BNDG COHESIVE 4X5 TAN STRL LF (GAUZE/BANDAGES/DRESSINGS) ×1 IMPLANT
BNDG COHESIVE 6X5 TAN NS LF (GAUZE/BANDAGES/DRESSINGS) ×1 IMPLANT
BNDG COHESIVE 6X5 TAN ST LF (GAUZE/BANDAGES/DRESSINGS) ×1 IMPLANT
BNDG ELASTIC 4X5.8 VLCR STR LF (GAUZE/BANDAGES/DRESSINGS) ×1 IMPLANT
BNDG ELASTIC 6X5.8 VLCR STR LF (GAUZE/BANDAGES/DRESSINGS) ×1 IMPLANT
BNDG ESMARK 6X9 LF (GAUZE/BANDAGES/DRESSINGS) ×1
BNDG GAUZE DERMACEA FLUFF 4 (GAUZE/BANDAGES/DRESSINGS) ×1 IMPLANT
CHLORAPREP W/TINT 26 (MISCELLANEOUS) ×2 IMPLANT
COVER SURGICAL LIGHT HANDLE (MISCELLANEOUS) ×1 IMPLANT
CUFF TOURN SGL QUICK 34 (TOURNIQUET CUFF) ×1
CUFF TRNQT CYL 34X4.125X (TOURNIQUET CUFF) ×1 IMPLANT
DRAPE C-ARM 42X120 X-RAY (DRAPES) IMPLANT
DRAPE C-ARMOR (DRAPES) ×1 IMPLANT
DRAPE EXTREMITY T 121X128X90 (DISPOSABLE) ×1 IMPLANT
DRAPE ORTHO SPLIT 77X108 STRL (DRAPES) ×2
DRAPE SURG ORHT 6 SPLT 77X108 (DRAPES) ×2 IMPLANT
DRAPE U-SHAPE 47X51 STRL (DRAPES) ×1 IMPLANT
DRILL SOLID 2.0 X 110MM (DRILL) ×1
DRSG ADAPTIC 3X8 NADH LF (GAUZE/BANDAGES/DRESSINGS) ×1 IMPLANT
DRSG MEPITEL 4X7.2 (GAUZE/BANDAGES/DRESSINGS) IMPLANT
ELECT REM PT RETURN 15FT ADLT (MISCELLANEOUS) ×1 IMPLANT
FIXATION ZIPTIGHT ANKLE SNDSMS (Ankle) IMPLANT
GAUZE PAD ABD 8X10 STRL (GAUZE/BANDAGES/DRESSINGS) ×5 IMPLANT
GAUZE SPONGE 4X4 12PLY STRL (GAUZE/BANDAGES/DRESSINGS) ×2 IMPLANT
GAUZE XEROFORM 5X9 LF (GAUZE/BANDAGES/DRESSINGS) ×1 IMPLANT
GLOVE SURG ENC MOIS LTX SZ7.5 (GLOVE) ×2 IMPLANT
GLOVE SURG MICRO LTX SZ7.5 (GLOVE) ×2 IMPLANT
GLOVE SURG POLYISO LF SZ7.5 (GLOVE) ×1 IMPLANT
GLOVE SURG UNDER POLY LF SZ7.5 (GLOVE) ×2 IMPLANT
GOWN STRL REUS W/ TWL LRG LVL3 (GOWN DISPOSABLE) ×1 IMPLANT
GOWN STRL REUS W/TWL LRG LVL3 (GOWN DISPOSABLE) ×1
K-WIRE ACE 1.6X6 (WIRE) ×2
KIT BASIN OR (CUSTOM PROCEDURE TRAY) ×1 IMPLANT
KIT TURNOVER KIT A (KITS) IMPLANT
KWIRE ACE 1.6X6 (WIRE) IMPLANT
NDL MAYO CATGUT SZ4 TPR NDL (NEEDLE) IMPLANT
NEEDLE HYPO 22GX1.5 SAFETY (NEEDLE) ×1 IMPLANT
NEEDLE MAYO CATGUT SZ4 (NEEDLE) IMPLANT
NS IRRIG 1000ML POUR BTL (IV SOLUTION) ×1 IMPLANT
PACK ORTHO EXTREMITY (CUSTOM PROCEDURE TRAY) ×1 IMPLANT
PAD CAST 4YDX4 CTTN HI CHSV (CAST SUPPLIES) ×6 IMPLANT
PADDING CAST COTTON 4X4 STRL (CAST SUPPLIES)
PADDING CAST COTTON 6X4 STRL (CAST SUPPLIES) ×1 IMPLANT
PLATE LOCK 7H 92 BILAT FIB (Plate) IMPLANT
PLATE MEDIAL MALLEOLUS 4H HOOK (Plate) IMPLANT
PROTECTOR NERVE ULNAR (MISCELLANEOUS) ×1 IMPLANT
PUTTY DBM STAGRAFT PLUS 2CC (Putty) IMPLANT
SCREW LOCK 3.5X12 DIST TIB (Screw) IMPLANT
SCREW LOCK 3.5X14 DIST TIB (Screw) IMPLANT
SCREW LOCK 3.5X44 DIST TIB (Screw) IMPLANT
SCREW LOCK CORT STAR 3.5X12 (Screw) IMPLANT
SCREW LOCK PLATE R3 2.7X8 (Screw) IMPLANT
SCREW LOCK PLATE R3 3.5X26 (Screw) IMPLANT
SCREW NON LOCKING LP 3.5 14MM (Screw) IMPLANT
SCREW RE3CON NL 3.5X30 (Screw) IMPLANT
SPIKE FLUID TRANSFER (MISCELLANEOUS) IMPLANT
SPLINT PLASTER CAST XFAST 5X30 (CAST SUPPLIES) IMPLANT
SPONGE T-LAP 18X18 ~~LOC~~+RFID (SPONGE) ×1 IMPLANT
STAPLER VISISTAT 35W (STAPLE) ×1 IMPLANT
STOCKINETTE TUBULAR 6 INCH (GAUZE/BANDAGES/DRESSINGS) ×1 IMPLANT
STOCKINETTE TUBULAR COTT 4X25 (GAUZE/BANDAGES/DRESSINGS) ×1 IMPLANT
STRIP CLOSURE SKIN 1/2X4 (GAUZE/BANDAGES/DRESSINGS) ×1 IMPLANT
SUCTION FRAZIER HANDLE 10FR (MISCELLANEOUS) ×1
SUCTION TUBE FRAZIER 10FR DISP (MISCELLANEOUS) ×1 IMPLANT
SUT ETHILON 2 0 FS 18 (SUTURE) IMPLANT
SUT ETHILON 3 0 PS 1 (SUTURE) ×1 IMPLANT
SUT MON AB 3-0 SH 27 (SUTURE)
SUT MON AB 3-0 SH27 (SUTURE) ×1 IMPLANT
SUT VIC AB 2-0 SH 27 (SUTURE) ×2
SUT VIC AB 2-0 SH 27XBRD (SUTURE) ×1 IMPLANT
SUT VIC AB 3-0 SH 27 (SUTURE)
SUT VIC AB 3-0 SH 27X BRD (SUTURE) ×2 IMPLANT
SYR CONTROL 10ML LL (SYRINGE) ×1 IMPLANT
WATER STERILE IRR 1000ML POUR (IV SOLUTION) ×1 IMPLANT
ZIPTIGHT ANKLE SYNODESMOSS FIX (Ankle) ×1 IMPLANT

## 2021-12-28 NOTE — Anesthesia Procedure Notes (Signed)
Procedure Name: Intubation Date/Time: 12/28/2021 1:11 PM  Performed by: Michele Rockers, CRNAPre-anesthesia Checklist: Patient identified, Patient being monitored, Timeout performed, Emergency Drugs available and Suction available Patient Re-evaluated:Patient Re-evaluated prior to induction Oxygen Delivery Method: Circle system utilized Preoxygenation: Pre-oxygenation with 100% oxygen Induction Type: IV induction Ventilation: Mask ventilation without difficulty Laryngoscope Size: Miller and 2 Grade View: Grade I Tube type: Oral Tube size: 7.0 mm Number of attempts: 1 Airway Equipment and Method: Stylet Placement Confirmation: ETT inserted through vocal cords under direct vision, positive ETCO2 and breath sounds checked- equal and bilateral Secured at: 21 cm Tube secured with: Tape Dental Injury: Teeth and Oropharynx as per pre-operative assessment

## 2021-12-28 NOTE — Transfer of Care (Signed)
Immediate Anesthesia Transfer of Care Note  Patient: Victoria George  Procedure(s) Performed: OPEN REDUCTION INTERNAL FIXATION (ORIF) left trimalleolar ANKLE FRACTURE syndesmosis fixation (Left: Ankle) ALLOGRAFT APPLICATION (Left: Ankle) SYNDESMOSIS REPAIR (Left: Ankle)  Patient Location: PACU   Anesthesia Type:General and Regional  Level of Consciousness: drowsy and patient cooperative  Airway & Oxygen Therapy: Patient Spontanous Breathing and Patient connected to face mask oxygen  Post-op Assessment: Report given to RN and Post -op Vital signs reviewed and stable  Post vital signs: Reviewed and stable  Last Vitals:  Vitals Value Taken Time  BP 131/63 12/28/21 1545  Temp    Pulse 86 12/28/21 1546  Resp 13 12/28/21 1546  SpO2 100 % 12/28/21 1546  Vitals shown include unvalidated device data.  Last Pain:  Vitals:   12/28/21 1038  TempSrc:   PainSc: 8       Patients Stated Pain Goal: 2 (63/33/54 5625)  Complications: No notable events documented.

## 2021-12-28 NOTE — Anesthesia Procedure Notes (Signed)
Anesthesia Regional Block: Popliteal block   Pre-Anesthetic Checklist: , timeout performed,  Correct Patient, Correct Site, Correct Laterality,  Correct Procedure, Correct Position, site marked,  Risks and benefits discussed,  Surgical consent,  Pre-op evaluation,  At surgeon's request and post-op pain management  Laterality: Left and Lower  Prep: chloraprep       Needles:  Injection technique: Single-shot  Needle Type: Echogenic Needle     Needle Length: 9cm  Needle Gauge: 21     Additional Needles:   Procedures:,,,, ultrasound used (permanent image in chart),,    Narrative:  Start time: 12/28/2021 12:11 PM End time: 12/28/2021 12:16 PM Injection made incrementally with aspirations every 5 mL.  Performed by: Personally  Anesthesiologist: Annye Asa, MD  Additional Notes: Pt identified in Holding room.  Monitors applied. Working IV access confirmed. Sterile prep L lateral knee/distal thigh.  #21ga ECHOgenic Arrow block needle to sciatic nerve at split in popliteal fossa with US guidance.  20cc 0.5% Bupivacaine 1:200k epi injected incrementally after negative test dose.  Patient asymptomatic, VSS, no heme aspirated, tolerated well.   Jenita Seashore, MD

## 2021-12-28 NOTE — Anesthesia Procedure Notes (Signed)
Anesthesia Regional Block: Adductor canal block   Pre-Anesthetic Checklist: , timeout performed,  Correct Patient, Correct Site, Correct Laterality,  Correct Procedure, Correct Position, site marked,  Risks and benefits discussed,  Surgical consent,  Pre-op evaluation,  At surgeon's request and post-op pain management  Laterality: Left and Lower  Prep: chloraprep       Needles:  Injection technique: Single-shot  Needle Type: Echogenic Needle     Needle Length: 9cm  Needle Gauge: 21     Additional Needles:   Procedures:,,,, ultrasound used (permanent image in chart),,    Narrative:  Start time: 12/28/2021 12:04 PM End time: 12/28/2021 12:10 PM Injection made incrementally with aspirations every 5 mL.  Performed by: Personally  Anesthesiologist: Annye Asa, MD  Additional Notes: Pt identified in Holding room.  Monitors applied. Working IV access confirmed. Sterile prep L thigh.  #21ga ECHOgenic Arrow block needle into adductor canal with US guidance.  10cc 0.5% Bupivacaine 1:200kepi injected incrementally after negative test dose.  Patient asymptomatic, VSS, no heme aspirated, tolerated well.   Jenita Seashore, MD

## 2021-12-28 NOTE — Anesthesia Postprocedure Evaluation (Signed)
Anesthesia Post Note  Patient: Victoria George  Procedure(s) Performed: OPEN REDUCTION INTERNAL FIXATION (ORIF) left trimalleolar ANKLE FRACTURE syndesmosis fixation (Left: Ankle) ALLOGRAFT APPLICATION (Left: Ankle) SYNDESMOSIS REPAIR (Left: Ankle)     Patient location during evaluation: PACU Anesthesia Type: General Level of consciousness: awake and alert Pain management: pain level controlled Vital Signs Assessment: post-procedure vital signs reviewed and stable Respiratory status: spontaneous breathing, nonlabored ventilation, respiratory function stable and patient connected to nasal cannula oxygen Cardiovascular status: blood pressure returned to baseline and stable Postop Assessment: no apparent nausea or vomiting Anesthetic complications: no   No notable events documented.  Last Vitals:  Vitals:   12/28/21 1615 12/28/21 1630  BP: 131/88 118/72  Pulse: 86 82  Resp: 16 12  Temp:  (!) 36.3 C  SpO2: 96% 96%    Last Pain:  Vitals:   12/28/21 1625  TempSrc:   PainSc: 6                  Mahiya Kercheval

## 2021-12-28 NOTE — H&P (Signed)
H&P Update: ? ?-History and Physical Reviewed ? ?-Patient has been re-examined ? ?-No change in the plan of care ? ?-The risks and benefits were presented and reviewed. The risks due to hardware failure/irritation, new/persistent infection, stiffness, nerve/vessel/tendon injury, nonunion/malunion, wound healing issues, development of arthritis, failure of this surgery, possibility of external fixation with delayed definitive surgery, need for further surgery, thromboembolic events, anesthesia/medical complications, amputation, death among others were discussed. The patient acknowledged the explanation, agreed to proceed with the plan and a consent was signed. ? ?Victoria George ? ?

## 2021-12-28 NOTE — Op Note (Addendum)
12/28/2021  9:56 PM   PATIENT: Victoria George  46 y.o. female  MRN: [id number]   PRE-OPERATIVE DIAGNOSIS:   Left closed displaced bimalleolar ankle fracture   POST-OPERATIVE DIAGNOSIS:   Same   PROCEDURE: Open reduction internal fixation of left bimalleolar ankle fracture Open reduction internal fixation of left ankle syndesmosis   SURGEON:  Armond Hang, MD   ASSISTANT: None   ANESTHESIA: General, regional   EBL: Minimal   TOURNIQUET:    Total Tourniquet Time Documented: Thigh (Left) - 108 minutes Total: Thigh (Left) - 573 minutes    COMPLICATIONS: None apparent   DISPOSITION: Extubated, awake and stable to recovery.   INDICATION FOR PROCEDURE: The patient presented with closed displaced left ankle fracture sustained on 12/15/21 when she twisted her ankle getting out of a car. Her PMH notable for asthma, HTN and otherwise healthy. She was closed reduced in the ER and followed up as outpatient.  We discussed the diagnosis, alternative treatment options, risks and benefits of the above surgical intervention, as well as alternative non-operative treatments. All questions/concerns were addressed and the patient/family demonstrated appropriate understanding of the diagnosis, the procedure, the postoperative course, and overall prognosis. The patient wished to proceed with surgical intervention and signed an informed surgical consent as such, in each others presence prior to surgery.   PROCEDURE IN DETAIL: After preoperative consent was obtained and the correct operative site was identified, the patient was brought to the operating room supine on stretcher and transferred onto operating table. General anesthesia was induced. Preoperative antibiotics were administered. Surgical timeout was taken. The patient was then positioned supine with an ipsilateral hip bump. The operative lower extremity was prepped and draped in standard sterile fashion with a tourniquet  around the thigh. The extremity was exsanguinated and the tourniquet was inflated to 275 mmHg.  A standard lateral incision was made over the distal fibula. Dissection was carried down to the level of the fibula and the fracture site identified. The superficial peroneal nerve was identified and protected throughout the procedure. The fibula was noted to be shortened with interposed periosteum. The fibula was brought out to length. The fibula fracture was debrided and the edges defined to achieve cortical read. Reduction maneuver was performed using pointed reduction forceps and lobster forceps. In this manner, the fibula length was restored and fracture reduced. A lag screw was not placed given the orientation of fracture lines and comminution. Due to poor bone quality and extensive comminution at the fracture site, it was decided to use a locking distal fibula plate. We then selected a Zimmer locking plate to match the anatomy of the distal fibula and placed it laterally. This was implanted under intraoperative fluoroscopy with a combination of distal locking screws and proximal cortical & locking screws.  We then made a direct medial ankle approach and extended this proximally in anticipation of implanting a hook plate. Dissection was carried down to the level of the medial malleolar fragment. A dental pick and freer elevator were used to reduce the medial malleolar fragment. Of note, there was extensive comminution of this fragment into multiple segments, all of which had very poor bone quality. A Paragon28 medial distal tibia hook plate was utilized to fix the reduced medial malleolar fragment and the tines were carefully inserted into the distal tip of the malleolus. The plate was oriented to best capture the major fragments of the medial comminution. We placed a non-locking screw in the hook plate and subsequently implanted another locking screw  proximally to further secure the plate. Demineralized bone  matrix putty allograft was used to fill the cortical gap in the medial most aspect of the medial malleolar fracture due to loss of native bone.  A manual external rotation stress radiograph was obtained and demonstrated widening of the ankle mortise. Given this intraoperative finding as well as preoperative subluxation, it was decided to reduce and fix the syndesmosis. Therefore a 3.5 mm solid screw was implanted through the fibula plate to fix the syndesmosis. Screw position was verified along anteromedial tibial cortex by fluoroscopy. A repeat stress radiograph showed complete stability of the ankle mortise to testing.  The surgical sites were thoroughly irrigated. The tourniquet was deflated and hemostasis achieved. The deep layers were closed using 2-0 vicryl and the subcutaneous tissues closed using 3-0 monocryl. The skin was closed without tension using 2-0 nylon suture.    The leg was cleaned with saline and sterile xeroform dressings with gauze were applied. A well padded bulky short leg splint was applied. The patient was awakened from anesthesia and transported to the recovery room in stable condition.    FOLLOW UP PLAN: -transfer to PACU, then home -strict NWB operative extremity, maximum elevation -maintain short leg splint until follow up -DVT ppx: Aspirin 81 mg twice daily while NWB -follow up as outpatient in 7-10 days for wound check -sutures out in 2-3 weeks with exchange of short leg splint to short leg cast in outpatient office   RADIOGRAPHS: AP, lateral, oblique and stress radiographs of the left ankle were obtained intraoperatively. These showed interval reduction and fixation of the fractures. Manual stress radiographs were taken and the ankle mortise and tibiofibular relationship were noted to be stable following fixation. All hardware is appropriately positioned and of the appropriate lengths. No other acute injuries are noted.   Armond Hang Orthopaedic  Surgery [facility]

## 2021-12-28 NOTE — Anesthesia Preprocedure Evaluation (Addendum)
Anesthesia Evaluation  Patient identified by MRN, date of birth, ID band Patient awake    Reviewed: Allergy & Precautions, NPO status , Patient's Chart, lab work & pertinent test results  History of Anesthesia Complications Negative for: history of anesthetic complications  Airway Mallampati: II  TM Distance: >3 FB Neck ROM: Full    Dental  (+) Dental Advisory Given, Teeth Intact   Pulmonary asthma , COPD,  COPD inhaler,    breath sounds clear to auscultation       Cardiovascular hypertension, Pt. on medications (-) angina Rhythm:Regular Rate:Abnormal  8/82023 ECHO: EF 55-60%, normal LVF, Grade 1 DD, normal RVF, no significant valvular abnormalities   Neuro/Psych negative neurological ROS  negative psych ROS   GI/Hepatic negative GI ROS, Neg liver ROS,   Endo/Other  Morbid obesityBMI 41.6  Renal/GU negative Renal ROS     Musculoskeletal   Abdominal (+) + obese,   Peds  Hematology negative hematology ROS (+)   Anesthesia Other Findings   Reproductive/Obstetrics                            Anesthesia Physical Anesthesia Plan  ASA: 3  Anesthesia Plan: General   Post-op Pain Management: Regional block* and Tylenol PO (pre-op)*   Induction: Intravenous  PONV Risk Score and Plan: 3 and Ondansetron, Dexamethasone and Scopolamine patch - Pre-op  Airway Management Planned: Oral ETT  Additional Equipment: None  Intra-op Plan:   Post-operative Plan: Extubation in OR  Informed Consent: I have reviewed the patients History and Physical, chart, labs and discussed the procedure including the risks, benefits and alternatives for the proposed anesthesia with the patient or authorized representative who has indicated his/her understanding and acceptance.     Dental advisory given  Plan Discussed with: CRNA  Anesthesia Plan Comments: (Plan routine monitors, GA with adductor canal and  popliteal blocks for post op analgesia)       Anesthesia Quick Evaluation

## 2021-12-31 ENCOUNTER — Encounter (HOSPITAL_COMMUNITY): Payer: Self-pay | Admitting: Orthopaedic Surgery

## 2022-01-02 ENCOUNTER — Encounter (HOSPITAL_COMMUNITY): Payer: Self-pay | Admitting: Orthopaedic Surgery

## 2022-01-15 ENCOUNTER — Other Ambulatory Visit: Payer: Self-pay

## 2022-04-19 ENCOUNTER — Ambulatory Visit (INDEPENDENT_AMBULATORY_CARE_PROVIDER_SITE_OTHER): Payer: PRIVATE HEALTH INSURANCE | Admitting: Allergy

## 2022-04-19 ENCOUNTER — Encounter: Payer: Self-pay | Admitting: Allergy

## 2022-04-19 ENCOUNTER — Other Ambulatory Visit: Payer: Self-pay

## 2022-04-19 VITALS — BP 120/82 | HR 79 | Temp 98.3°F | Resp 16 | Ht 62.0 in

## 2022-04-19 DIAGNOSIS — L500 Allergic urticaria: Secondary | ICD-10-CM | POA: Diagnosis not present

## 2022-04-19 DIAGNOSIS — J3089 Other allergic rhinitis: Secondary | ICD-10-CM

## 2022-04-19 DIAGNOSIS — Z91018 Allergy to other foods: Secondary | ICD-10-CM

## 2022-04-19 DIAGNOSIS — L239 Allergic contact dermatitis, unspecified cause: Secondary | ICD-10-CM

## 2022-04-19 DIAGNOSIS — T783XXD Angioneurotic edema, subsequent encounter: Secondary | ICD-10-CM

## 2022-04-19 DIAGNOSIS — H1013 Acute atopic conjunctivitis, bilateral: Secondary | ICD-10-CM

## 2022-04-19 MED ORDER — CETIRIZINE HCL 10 MG PO TABS: 10.0000 mg | ORAL_TABLET | Freq: Every day | ORAL | 5 refills | Status: DC | PRN

## 2022-04-19 MED ORDER — EPINEPHRINE 0.3 MG/0.3ML IJ SOAJ: 0.3000 mg | INTRAMUSCULAR | 1 refills | Status: DC | PRN

## 2022-04-19 MED ORDER — SYMBICORT 80-4.5 MCG/ACT IN AERO: INHALATION_SPRAY | RESPIRATORY_TRACT | 5 refills | Status: AC

## 2022-04-19 MED ORDER — MONTELUKAST SODIUM 10 MG PO TABS: 10.0000 mg | ORAL_TABLET | Freq: Every day | ORAL | 5 refills | Status: DC

## 2022-04-19 MED ORDER — ALBUTEROL SULFATE HFA 108 (90 BASE) MCG/ACT IN AERS: 2.0000 | INHALATION_SPRAY | Freq: Four times a day (QID) | RESPIRATORY_TRACT | 1 refills | Status: AC | PRN

## 2022-04-19 NOTE — Progress Notes (Signed)
New Patient Note  RE: Victoria George MRN: [id number] DOB: Feb 18, 1976 Date of Office Visit: 04/19/2022   Primary care provider: Vonna Drafts, FNP  Chief Complaint: asthma, hives, itching  History of present illness: Victoria George is a 47 y.o. female presenting today for evaluation of allergic reaction/hives.  She is a former pt of mine with last visit on 05/03/17 by asthma and allergic rhinitis.    She had a reaction to the hair she had installed that was not pre-soaked with ACV.  She normally has this step done to help prevent reactions or irritation.  She developed hives and lip swelling the next day and the hives spread to her arms and legs.  She went to UC the next day and she was prescribed steroids that helped.  This occurred in August 2023.  At the [facility] on 11/[facility]/21 she was noted on exam to have a "ras present. Rash is urticarial. Urticarial rash noted diffusely throughout bilateral upper and lower extremities."  She was treated with decadron and prednisone for 5 days.  The braids were removed.   She states she had another episode of itching and hives after having crangrape juice several hours later.  This occurred several days after the incident above.  She has not had any since and does like this juice.  She states her lip and tongue felt tingling.   She did take benadryl.  She did go to UC for this on 11/24/21 and again on exam was noted to have "diffuse urticarial rash to arms, legs, torso". She received another decadron injection.    She had a third episode of hives and itching about week later.   She does not recall eating/drinking anything unusual in the diet.  She went back to UC on 11/25/21 for similar symptoms of hives. Agan on exame noted to have diffuse urticaria.  She received IV solumedrol, IV pepcid, IV benadryl.   She has had additional hair installs with the pretreatment soak with issue since these episode.  She has history of asthma and has albuterol inhaler as well  as symbicort inhaler.  She states asthma has been pretty good.  She does [place] and stuff[place] and she did need to use symbicort/albuterol during this trip.     She states her environmental allergies have been doing ok.  She hasn't had significant need to use allergy medications routinely. She has taken zyrtec in the past as well as used flonase.  Currently states will use benadryl if needed.     She continues to avoid shellfish (crustasceans), milk, beef, orange and pineapple.  The fruits cause her mouth to itch/tingle.    As discussed previously from 2018: reports throat closing and tongue swelling with no hives after eating shellfish.  dairy sometimes makes her feel itchy. She has access to an epinephrine device.   Review of systems in the past 4 weeks: Review of Systems  Constitutional: Negative.   HENT: Negative.    Eyes: Negative.   Respiratory: Negative.    Cardiovascular: Negative.   Gastrointestinal: Negative.   Musculoskeletal: Negative.   Skin: Negative.   Allergic/Immunologic: Negative.   Neurological: Negative.     All other systems negative unless noted above in HPI  Past medical history: Past Medical History:  Diagnosis Date   Anemia 03/02/2011   History of gastritis    12/ 2012   History of ovarian cyst    Hypertension  Hypokalemia 09/2021   Iron deficiency anemia    Low serum potassium level    Moderate persistent asthma, uncomplicated    [facility] dr Nelva Bush   Nausea and vomiting during pregnancy 03/02/2011   Seasonal and perennial allergic rhinitis    Status post primary low transverse cesarean section 03/02/2019   Submucous leiomyoma of uterus    Urticaria    Uterine fibroids affecting pregnancy in third trimester 03/02/2019   Wears glasses     Past surgical history: Past Surgical History:  Procedure Laterality Date   ALLOGRAFT APPLICATION Left 69/08/7891   Procedure: ALLOGRAFT  APPLICATION;  Surgeon: Armond Hang, MD;  Location: [place];  Service: Orthopedics;  Laterality: Left;   CARDIOVASCULAR STRESS TEST     CESAREAN SECTION N/A 03/02/2019   Procedure: CESAREAN SECTION;  Surgeon: Sherlyn Hay, DO;  Location: [facility];  Service: Obstetrics;  Laterality: N/A;   CHOLECYSTECTOMY  02/01/2011   Procedure: LAPAROSCOPIC CHOLECYSTECTOMY WITH INTRAOPERATIVE CHOLANGIOGRAM;  Surgeon: Odis Hollingshead, MD;  Location: [facility];  Service: General;  Laterality: N/A;  Needs IOC and C-arm   DIAGNOSTIC LAPAROSCOPY  07/2007   ?Lysis Adhesions for obstructive right fallopian tube   ESOPHAGOGASTRODUODENOSCOPY  01/30/2011   Procedure: ESOPHAGOGASTRODUODENOSCOPY (EGD);  Surgeon: Zenovia Jarred, MD;  Location: [facility];  Service: Gastroenterology;  Laterality: N/A;   ESOPHAGOGASTRODUODENOSCOPY  03/03/2011   Procedure: ESOPHAGOGASTRODUODENOSCOPY (EGD);  Surgeon: Zenovia Jarred, MD;  Location: [facility];  Service: Gastroenterology;  Laterality: N/A;   HYSTEROSCOPY N/A 03/22/2016   Procedure: HYSTEROSCOPY, POLYPECTOMY;  Surgeon: Governor Specking, MD;  Location: [place];  Service: Gynecology;  Laterality: N/A;   HYSTEROSCOPY WITH RESECTOSCOPE N/A 09/19/2017   Procedure: HYSTEROSCOPY MYOMECTOMY;  Surgeon: Governor Specking, MD;  Location: [facility];  Service: Gynecology;  Laterality: N/A;   LAPAROSCOPIC GELPORT ASSISTED MYOMECTOMY N/A 03/22/2016   Procedure: LAPAROSCOPIC GELPORT ASSISTED MYOMECTOMY, LYSIS OF ADHESIONS, CHROMOPERTUBATION;  Surgeon: Governor Specking, MD;  Location: [place];  Service: Gynecology;  Laterality: N/A;   ORIF ANKLE FRACTURE Left 12/28/2021   Procedure: OPEN REDUCTION INTERNAL FIXATION (ORIF) left trimalleolar ANKLE FRACTURE syndesmosis fixation;  Surgeon: Armond Hang, MD;  Location: [place];  Service: Orthopedics;  Laterality: Left;  120 min   SYNDESMOSIS REPAIR Left 12/28/2021   Procedure: SYNDESMOSIS  REPAIR;  Surgeon: Armond Hang, MD;  Location: [place];  Service: Orthopedics;  Laterality: Left;   TRANSTHORACIC ECHOCARDIOGRAM      Family history:  Family History  Problem Relation Age of Onset   Hypertension Mother    Diabetes Brother    Diabetes Brother    Asthma Brother    Colon cancer Maternal Grandmother    Diabetes Maternal Grandmother    Kidney disease Maternal Grandmother    Diabetes Paternal Grandmother    Diverticulosis Paternal Grandmother    Anesthesia problems Neg Hx    Malignant hyperthermia Neg Hx    Pseudochol deficiency Neg Hx     Social history: Lives in a home with carpeting in the bedroom with electric heating and heat pump heating and central cooling.  No pets in the home.  No concern for water damage, mildew. Concern for roaches in the home.  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 Denies smoking history.     Medication List: Current Outpatient Medications  Medication Sig Dispense Refill   acetaminophen (TYLENOL) 500 MG tablet Take 500 mg by mouth every 4 (four) hours as  needed (pain.).     budesonide-formoterol (SYMBICORT) 80-4.5 MCG/ACT inhaler Inhale 2 puffs into the lungs every morning. (Patient taking differently: Inhale 2 puffs into the lungs 2 (two) times daily as needed (asthma).) 1 Inhaler 5   cetirizine (ZYRTEC) 10 MG tablet Take 1 tablet (10 mg total) by mouth daily as needed for allergies. 30 tablet 5   EPINEPHrine (EPIPEN 2-PAK) 0.3 mg/0.3 mL IJ SOAJ injection Inject 0.3 mg into the muscle as needed for anaphylaxis. 0.3 mL 1   EPINEPHrine 0.3 mg/0.3 mL IJ SOAJ injection Inject 0.3 mg into the muscle as needed for anaphylaxis.     hydrochlorothiazide (HYDRODIURIL) 25 MG tablet Take 25 mg by mouth in the morning.     ibuprofen (ADVIL) 200 MG tablet Take 200 mg by mouth every 4 (four) hours as needed (pain.).     montelukast (SINGULAIR) 10 MG tablet Take 1 tablet (10 mg total)  by mouth at bedtime. 30 tablet 5   Multiple Vitamins-Minerals (ADULT ONE DAILY GUMMIES PO) Take 2 tablets by mouth in the morning.     Potassium 99 MG TABS Take 99 mg by mouth in the morning.     pregabalin (LYRICA) 100 MG capsule Take 100 mg by mouth 2 (two) times daily.     SYMBICORT 80-4.5 MCG/ACT inhaler 2 puffs 2 times daily for 2 weeks at a time during worsening symptoms or respiratory infections. 10.2 g 5   albuterol (VENTOLIN HFA) 108 (90 Base) MCG/ACT inhaler Inhale 2 puffs into the lungs every 6 (six) hours as needed for wheezing or shortness of breath. 18 g 1   HYDROmorphone (DILAUDID) 2 MG tablet Take 0.5 tablets (1 mg total) by mouth every 6 (six) hours as needed for severe pain. (Patient not taking: Reported on 04/19/2022) 8 tablet 0   naloxone (NARCAN) nasal spray 4 mg/0.1 mL Use once in the nose as needed for decreased responsiveness (Patient not taking: Reported on 04/19/2022) 1 each 1   triamcinolone cream (KENALOG) 0.1 % Apply 1 Application topically 2 (two) times daily. (Patient not taking: Reported on 12/21/2021) 30 g 0   No current facility-administered medications for this visit.    Known medication allergies: Allergies  Allergen Reactions   Phenergan [Promethazine] Shortness Of Breath and Palpitations   Shellfish Allergy Anaphylaxis    ALL SHELLFISH   Acetaminophen-Codeine    Bee Pollen     Other reaction(s): Cough, Runny nose, Wheezing   Codeine Hives   K-Tab [Potassium Chloride Er]     Yellow Ktab causes hives   Milk-Related Compounds Itching and Swelling    All dairy products   Other Hives and Swelling    Oranges,  pineapples   Tramadol Other (See Comments)    Ultram-- " manic episode"   Hydrocodone Hives and Itching    vicodin   Percocet [Oxycodone-Acetaminophen] Other (See Comments)    insomnia     Physical examination: Blood pressure 120/82, pulse 79, temperature 98.3 F (36.8 C), temperature source Temporal, resp. rate 16, height '5\' 2"'$  (1.575 m),  SpO2 98 %, unknown if currently breastfeeding.  General: Alert, interactive, in no acute distress. HEENT: PERRLA, TMs pearly gray, turbinates non-edematous without discharge, post-pharynx non erythematous. Neck: Supple without lymphadenopathy. Lungs: Clear to auscultation without wheezing, rhonchi or rales. {no increased work of breathing. CV: Normal S1, S2 without murmurs. Abdomen: Nondistended, nontender. Skin: Warm and dry, without lesions or rashes. Extremities:  No clubbing, cyanosis or edema. Neuro:   Grossly intact.  Diagnositics/Labs: None today  Assessment and plan:   Contact allergic reaction - hives, swelling - you are reactive to the chemicals in synthetic hair and should always opt for the ACV soak prior to install - you developed hives and swelling after application of synthetic hair not pre-washed.  This led to a immune system cascade of hives/swelling over a 2-3 week course.  I do not believe you had other triggers during this time frame - will obtain IgE levels for cranberry, grape to see if you do not have an allergy signal and can resume ingestion of these if negative - hives/swelling can be caused by a variety of different triggers including illness/infection, foods, medications, stings, exercise, pressure, vibrations, extremes of temperature to name a few however majority of the time there is no identifiable trigger.   Thus it is possible to have future episodes of hives/swelling for other reasons.  - if hives/swelling return recommend you take Allegra or Zyrtec 1 tab 1-2 times a day with Pepcid 1 tab 1- times a day until symptoms resolve  Moderate persistent asthma without complication - Prior to physical activity: albuterol 2 puffs 10-15 minutes before physical activity. - Rescue medications: albuterol 2 puffs or Symbicort 1-2 puffs every 4-6 hours as needed - Changes during respiratory infections or worsening symptoms: Add on Symbicort 48mg 2 puffs twice a day for  TWO WEEKS. - If not meeting below goals then take Symbicort 2 puffs twice a day as maintenance therapy.  Symbicort can be used as both maintenance and rescue medication.  Max dosing is 10-12 puffs a day  - Asthma control goals:  * Full participation in all desired activities (may need albuterol before activity) * Albuterol use two time or less a week on average (not counting use with activity) * Cough interfering with sleep two time or less a month * Oral steroids no more than once a year * No hospitalizations   Allergic rhinoconjunctivitis - Continue avoidance measures for  tree, weed, and grass pollens, molds, cat, dog, dust mite, and cockroach.  Will update your allergies with labwork - Continue avoidance measures  - Continue Zyrtec or Allegra as needed  Anaphylactic shock due to food - Continue to avoid crab, shrimp, lobster, orange, milk, beef, and, pineapple - obtaining IgE levels for these foods - Carry EpiPen at all times   Follow up in 6 months or sooner as needed  I appreciate the opportunity to take part in Harryette's care. Please do not hesitate to contact me with questions.  Sincerely,   SPrudy Feeler MD Allergy/Immunology [facility]

## 2022-04-19 NOTE — Patient Instructions (Addendum)
Contact allergic reaction - hives, swelling - you are reactive to the chemicals in synthetic hair and should always opt for the ACV soak prior to install - you developed hives and swelling after application of synthetic hair not pre-washed.  This led to a immune system cascade of hives/swelling over a 2-3 week course.  I do not believe you had other triggers during this time frame - will obtain IgE levels for cranberry, grape to see if you do not have an allergy signal and can resume ingestion of these if negative - hives/swelling can be caused by a variety of different triggers including illness/infection, foods, medications, stings, exercise, pressure, vibrations, extremes of temperature to name a few however majority of the time there is no identifiable trigger.   Thus it is possible to have future episodes of hives/swelling for other reasons.  - if hives/swelling return recommend you take Allegra or Zyrtec 1 tab 1-2 times a day with Pepcid 1 tab 1- times a day until symptoms resolve  Moderate persistent asthma without complication - Prior to physical activity: albuterol 2 puffs 10-15 minutes before physical activity. - Rescue medications: albuterol 2 puffs or Symbicort 1-2 puffs every 4-6 hours as needed - Changes during respiratory infections or worsening symptoms: Add on Symbicort 14mg 2 puffs twice a day for TWO WEEKS. - If not meeting below goals then take Symbicort 2 puffs twice a day as maintenance therapy.  Symbicort can be used as both maintenance and rescue medication.  Max dosing is 10-12 puffs a day  - Asthma control goals:  * Full participation in all desired activities (may need albuterol before activity) * Albuterol use two time or less a week on average (not counting use with activity) * Cough interfering with sleep two time or less a month * Oral steroids no more than once a year * No hospitalizations   Allergic rhinoconjunctivitis - Continue avoidance measures for  tree,  weed, and grass pollens, molds, cat, dog, dust mite, and cockroach.  Will update your allergies with labwork - Continue avoidance measures  - Continue Zyrtec or Allegra as needed  Anaphylactic shock due to food - Continue to avoid crab, shrimp, lobster, orange, milk, beef, and, pineapple - obtaining IgE levels for these foods - Carry EpiPen at all times   Follow up in 6 months or sooner as needed

## 2022-04-21 ENCOUNTER — Other Ambulatory Visit (HOSPITAL_COMMUNITY): Payer: Self-pay

## 2022-04-21 LAB — ALLERGEN PROFILE, SHELLFISH
Clam IgE: <0.1 kU/L
F023-IgE Crab: <0.1 kU/L
F080-IgE Lobster: <0.1 kU/L
F290-IgE Oyster: <0.1 kU/L
Scallop IgE: <0.1 kU/L
Shrimp IgE: 0.31 kU/L — AB

## 2022-04-21 LAB — ALLERGENS W/TOTAL IGE AREA 2
Alternaria Alternata IgE: <0.1 kU/L
Aspergillus Fumigatus IgE: 0.1 kU/L — AB
Bermuda Grass IgE: 0.32 kU/L — AB
Cat Dander IgE: <0.1 kU/L
Cedar, Mountain IgE: <0.1 kU/L
Cladosporium Herbarum IgE: <0.1 kU/L
Cockroach, German IgE: <0.1 kU/L
Common Silver Birch IgE: 2.25 kU/L — AB
Cottonwood IgE: <0.1 kU/L
D Farinae IgE: <0.1 kU/L
D Pteronyssinus IgE: 0.13 kU/L — AB
Dog Dander IgE: 0.15 kU/L — AB
Elm, American IgE: 0.12 kU/L — AB
IgE (Immunoglobulin E), Serum: 159 IU/mL (ref 6–495)
Johnson Grass IgE: 0.15 kU/L — AB
Maple/Box Elder IgE: 0.51 kU/L — AB
Mouse Urine IgE: <0.1 kU/L
Oak, White IgE: <0.1 kU/L
Pecan, Hickory IgE: 13.2 kU/L — AB
Penicillium Chrysogen IgE: <0.1 kU/L
Pigweed, Rough IgE: <0.1 kU/L
Ragweed, Short IgE: 0.61 kU/L — AB
Sheep Sorrel IgE Qn: 0.13 kU/L — AB
Timothy Grass IgE: 3.01 kU/L — AB
White Mulberry IgE: 2.75 kU/L — AB

## 2022-04-21 LAB — ALLERGEN, PINEAPPLE, F210: Pineapple IgE: <0.1 kU/L

## 2022-04-21 LAB — ALLERGEN GRAPE F259: Allergen Grape IgE: <0.1 kU/L

## 2022-04-21 LAB — ALLERGEN BEEF: Beef IgE: 0.14 kU/L — AB

## 2022-04-21 LAB — ALLERGEN, ORANGE F33: Orange: <0.1 kU/L

## 2022-04-21 LAB — ALLERGEN MILK: Milk IgE: 0.51 kU/L — AB

## 2022-04-21 LAB — TRYPTASE: Tryptase: 10.4 ug/L (ref 2.2–13.2)

## 2022-04-21 LAB — F341-IGE CRANBERRY: Cranberry IgE: <0.1 kU/L

## 2022-04-24 ENCOUNTER — Other Ambulatory Visit (HOSPITAL_COMMUNITY): Payer: Self-pay

## 2022-04-24 ENCOUNTER — Telehealth: Payer: Self-pay

## 2022-04-24 NOTE — Telephone Encounter (Signed)
PA request received via CMM for Symbicort 80-4.5MCG/ACT aerosol  Key: [id number]  PA not submitted due to test claim showing medication is covered by alternative Primary insurance:

## 2022-05-21 ENCOUNTER — Telehealth: Payer: Self-pay | Admitting: Allergy & Immunology

## 2022-05-21 NOTE — Telephone Encounter (Signed)
Patient called regarding her daughter.

## 2022-05-23 ENCOUNTER — Other Ambulatory Visit: Payer: Self-pay | Admitting: Nurse Practitioner

## 2022-05-23 DIAGNOSIS — Z1231 Encounter for screening mammogram for malignant neoplasm of breast: Secondary | ICD-10-CM

## 2022-06-13 ENCOUNTER — Ambulatory Visit
Admission: RE | Admit: 2022-06-13 | Discharge: 2022-06-13 | Disposition: A | Discharge status: Home / Self Care | Payer: PRIVATE HEALTH INSURANCE | Source: Ambulatory Visit

## 2022-06-13 ENCOUNTER — Other Ambulatory Visit: Payer: Self-pay

## 2022-06-13 DIAGNOSIS — Z1231 Encounter for screening mammogram for malignant neoplasm of breast: Secondary | ICD-10-CM

## 2022-06-15 ENCOUNTER — Other Ambulatory Visit: Payer: Self-pay | Admitting: Nurse Practitioner

## 2022-06-15 DIAGNOSIS — R928 Other abnormal and inconclusive findings on diagnostic imaging of breast: Secondary | ICD-10-CM

## 2022-06-28 ENCOUNTER — Other Ambulatory Visit: Payer: Self-pay | Admitting: Nurse Practitioner

## 2022-06-28 ENCOUNTER — Ambulatory Visit
Admission: RE | Admit: 2022-06-28 | Discharge: 2022-06-28 | Disposition: A | Discharge status: Home / Self Care | Payer: PRIVATE HEALTH INSURANCE | Source: Ambulatory Visit | Attending: Nurse Practitioner | Admitting: Nurse Practitioner

## 2022-06-28 DIAGNOSIS — N6320 Unspecified lump in the left breast, unspecified quadrant: Secondary | ICD-10-CM

## 2022-06-28 DIAGNOSIS — R928 Other abnormal and inconclusive findings on diagnostic imaging of breast: Secondary | ICD-10-CM

## 2022-07-06 DIAGNOSIS — Z1231 Encounter for screening mammogram for malignant neoplasm of breast: Secondary | ICD-10-CM

## 2022-07-12 ENCOUNTER — Encounter (HOSPITAL_BASED_OUTPATIENT_CLINIC_OR_DEPARTMENT_OTHER): Payer: Self-pay | Admitting: Orthopaedic Surgery

## 2022-07-12 NOTE — Progress Notes (Signed)
Spoke w/ via phone for pre-op interview--- pt Lab needs dos---- istat, EKG, urine preg              Lab results------ no COVID test -----patient states asymptomatic no test needed Arrive at ------- 0745 on 07-17-2022 NPO after MN NO Solid Food.  Clear liquids from MN until--- 0645 Med rec completed Medications to take morning of surgery ----- none Diabetic medication ----- n/a Patient instructed no nail polish to be worn day of surgery Patient instructed to bring photo id and insurance card day of surgery Patient aware to have Driver (ride ) / caregiver    for 24 hours after surgery -- husband, william Patient Special Instructions ----- asked to bring rescue inhaler and symbicort inhaler dos  Pre-Op special Istructions ----- case just added on, orders pending Patient verbalized understanding of instructions that were given at this phone interview. Patient denies shortness of breath, chest pain, fever, cough at this phone interview.

## 2022-07-13 NOTE — H&P (Addendum)
ORTHOPAEDIC SURGERY H&P  Subjective:  The patient presents with left ankle for elective hardware removal.   Past Medical History:  Diagnosis Date   Allergic urticaria    H/O hypokalemia 03/2021   ED visit due to syncope w/ LOC ,  potassium level 2.7 felt due to taking HCTZ   History of gastritis    12/ 2012   History of iron deficiency anemia    History of ovarian cyst    Hypertension    Moderate persistent asthma, uncomplicated    [facility] dr Delorse Lek   Non-seasonal allergic rhinitis due to other allergic trigger    Seasonal and perennial allergic rhinitis    Submucous leiomyoma of uterus    Wears glasses     Past Surgical History:  Procedure Laterality Date   ALLOGRAFT APPLICATION Left 12/28/2021   Procedure: ALLOGRAFT APPLICATION;  Surgeon: Netta Cedars, MD;  Location: [facility] OR;  Service: Orthopedics;  Laterality: Left;   CESAREAN SECTION N/A 03/02/2019   Procedure: CESAREAN SECTION;  Surgeon: Edwinna Areola, DO;  Location: [facility] ORS;  Service: Obstetrics;  Laterality: N/A;   CHOLECYSTECTOMY  02/01/2011   Procedure: LAPAROSCOPIC CHOLECYSTECTOMY WITH INTRAOPERATIVE CHOLANGIOGRAM;  Surgeon: Adolph Pollack, MD;  Location: [facility] ORS;  Service: General;  Laterality: N/A;  Needs IOC and C-arm   DIAGNOSTIC LAPAROSCOPY  07/2007   ?Lysis Adhesions for obstructive right fallopian tube   ESOPHAGOGASTRODUODENOSCOPY  01/30/2011   Procedure: ESOPHAGOGASTRODUODENOSCOPY (EGD);  Surgeon: Erick Blinks, MD;  Location: [facility];  Service: Gastroenterology;  Laterality: N/A;   ESOPHAGOGASTRODUODENOSCOPY  03/03/2011   Procedure: ESOPHAGOGASTRODUODENOSCOPY (EGD);  Surgeon: Erick Blinks, MD;  Location: [facility];  Service: Gastroenterology;  Laterality: N/A;   HYSTEROSCOPY N/A 03/22/2016   Procedure: HYSTEROSCOPY, POLYPECTOMY;  Surgeon: Fermin Schwab, MD;  Location: [facility];  Service: Gynecology;  Laterality: N/A;   HYSTEROSCOPY WITH RESECTOSCOPE  N/A 09/19/2017   Procedure: HYSTEROSCOPY MYOMECTOMY;  Surgeon: Fermin Schwab, MD;  Location: [facility];  Service: Gynecology;  Laterality: N/A;   LAPAROSCOPIC GELPORT ASSISTED MYOMECTOMY N/A 03/22/2016   Procedure: LAPAROSCOPIC GELPORT ASSISTED MYOMECTOMY, LYSIS OF ADHESIONS, CHROMOPERTUBATION;  Surgeon: Fermin Schwab, MD;  Location: [facility];  Service: Gynecology;  Laterality: N/A;   ORIF ANKLE FRACTURE Left 12/28/2021   Procedure: OPEN REDUCTION INTERNAL FIXATION (ORIF) left trimalleolar ANKLE FRACTURE syndesmosis fixation;  Surgeon: Netta Cedars, MD;  Location: [facility] OR;  Service: Orthopedics;  Laterality: Left;  120 min   SYNDESMOSIS REPAIR Left 12/28/2021   Procedure: SYNDESMOSIS REPAIR;  Surgeon: Netta Cedars, MD;  Location: [facility] OR;  Service: Orthopedics;  Laterality: Left;     (Not in an outpatient encounter)    Allergies  Allergen Reactions   Phenergan [Promethazine] Shortness Of Breath and Palpitations   Shellfish Allergy Anaphylaxis    ALL SHELLFISH   Bee Pollen     Other reaction(s): Cough, Runny nose, Wheezing   Codeine Hives   K-Tab [Potassium Chloride Er]     Yellow Ktab causes hives   Milk-Related Compounds Itching and Swelling    All dairy products   Other Hives and Swelling    Oranges,  pineapples   Oxycodone Other (See Comments)    Percocet-- "insomnia"   Tramadol Other (See Comments)    Ultram-- " manic episode"   Hydrocodone Hives and Itching    vicodin    Social History   Socioeconomic History   Marital status: Married    Spouse name: Not on file   Number  of children: 0   Years of education: Not on file   Highest education level: Not on file  Occupational History   Occupation: counselor    Employer: [employer]  Tobacco Use   Smoking status: Never    Passive exposure: Never   Smokeless tobacco: Never  Vaping Use   Vaping Use: Never used  Substance and Sexual Activity   Alcohol use: Not  Currently   Drug use: Never   Sexual activity: Yes    Partners: Male    Birth control/protection: None    Comment: perimenopausal  Other Topics Concern   Not on file  Social History Narrative   Not on file   Social Determinants of Health   Financial Resource Strain: Not on file  Food Insecurity: Not on file  Transportation Needs: Not on file  Physical Activity: Not on file  Stress: Not on file  Social Connections: Not on file  Intimate Partner Violence: Not on file     History reviewed. No pertinent family history.   Review of Systems Pertinent items are noted in HPI.  Objective: Vital signs in last 24 hours:    07/12/2022   11:14 AM 04/19/2022    9:48 AM 12/28/2021    4:30 PM  Vitals with BMI  Height     Weight 235 lbs    BMI 42.97    Systolic  120 118  Diastolic  82 72  Pulse  79 82      EXAM: General: Well nourished, well developed. Awake, alert and oriented to time, place, person. Normal mood and affect. No apparent distress. Breathing room air.  Operative Lower Extremity: Alignment - Neutral Deformity - None Skin intact Tenderness to palpation - none 5/5 TA, PT, GS, Per, EHL, FHL Sensation intact to light touch throughout Palpable DP and PT pulses Special testing: None  The contralateral foot/ankle was examined for comparison and noted to be neurovascularly intact with no localized deformity, swelling, or tenderness.  Imaging Review All images taken were independently reviewed by me.  Assessment/Plan: The clinical and radiographic findings were reviewed and discussed at length with the patient.  The patient has left ankle s/p ORIF now here for elective hardware removal of syndesmotic screw.  We spoke at length about the natural course of these findings. We discussed nonoperative and operative treatment options in detail.  The risks and benefits were presented and reviewed. The risks due to partial/total inability to remove all/part of  hardware, hardware breakage, instability, implant failure/irritation, infection, stiffness, nerve/vessel/tendon injury, wound healing issues, failure of this surgery, need for further surgery, thromboembolic events, amputation, death among others were discussed. The patient acknowledged the explanation and agreed to proceed with the plan.  Netta Cedars  Orthopaedic Surgery [facility]

## 2022-07-13 NOTE — Discharge Instructions (Addendum)
Netta Cedars, MD [facility]  Please read the following information regarding your care after surgery.  Medications  You only need a prescription for the narcotic pain medicine (ex. oxycodone, Percocet, Norco).  All of the other medicines listed below are available over the counter. ? Aleve 2 pills twice a day for the first 3 days after surgery. ? acetominophen (Tylenol) 650 mg every 4-6 hours as you need for minor to moderate pain (last dose of Tylenol was given to you at 0839hrs before your surgical procedure, you may take your next dose of Tylenol after 2:30pm today) ? oxycodone as prescribed for severe pain  ? To help prevent blood clots, take aspirin (81 mg) twice daily for 28 days after surgery (or total duration of nonweightbearing).  You should also get up every hour while you are awake to move around.  Weight Bearing ? You may walk in CAM boot ONLY AFTER the nerve block has completely worn off!!!  Cast / Splint / Dressing ? If you have a splint, do NOT remove this. Keep your splint, cast or dressing clean and dry.  Don't put anything (coat hanger, pencil, etc) down inside of it.  If it gets wet, call the office immediately to schedule an appointment for a cast change.  Swelling IMPORTANT: It is normal for you to have swelling where you had surgery. To reduce swelling and pain, keep at least 3 pillows under your leg so that your toes are above your nose and your heel is above the level of your hip.  It may be necessary to keep your foot or leg elevated for several weeks.  This is critical to helping your incisions heal and your pain to feel better.  Follow Up Call my office at [phone] when you are discharged from the hospital or surgery center to schedule an appointment to be seen 7-10 days after surgery.  Call my office at [phone] if you develop a fever >101.5 F, nausea, vomiting, bleeding from the surgical site or severe pain.     Post Anesthesia Home Care  Instructions  Activity: Get plenty of rest for the remainder of the day. A responsible individual must stay with you for 24 hours following the procedure.  For the next 24 hours, DO NOT: -Drive a car -Advertising copywriter -Drink alcoholic beverages -Take any medication unless instructed by your physician -Make any legal decisions or sign important papers.  Meals: Start with liquid foods such as gelatin or soup. Progress to regular foods as tolerated. Avoid greasy, spicy, heavy foods. If nausea and/or vomiting occur, drink only clear liquids until the nausea and/or vomiting subsides. Call your physician if vomiting continues.  Special Instructions/Symptoms: Your throat may feel dry or sore from the anesthesia or the breathing tube placed in your throat during surgery. If this causes discomfort, gargle with warm salt water. The discomfort should disappear within 24 hours.

## 2022-07-17 ENCOUNTER — Encounter (HOSPITAL_BASED_OUTPATIENT_CLINIC_OR_DEPARTMENT_OTHER)
Admission: RE | Disposition: A | Discharge status: Home / Self Care | Payer: Self-pay | Source: Home / Self Care | Attending: Orthopaedic Surgery

## 2022-07-17 ENCOUNTER — Other Ambulatory Visit: Payer: Self-pay

## 2022-07-17 ENCOUNTER — Ambulatory Visit (HOSPITAL_BASED_OUTPATIENT_CLINIC_OR_DEPARTMENT_OTHER): Payer: PRIVATE HEALTH INSURANCE | Admitting: Anesthesiology

## 2022-07-17 ENCOUNTER — Ambulatory Visit (HOSPITAL_BASED_OUTPATIENT_CLINIC_OR_DEPARTMENT_OTHER)
Admission: RE | Admit: 2022-07-17 | Discharge: 2022-07-17 | Disposition: A | Discharge status: Home / Self Care | Payer: PRIVATE HEALTH INSURANCE | Attending: Orthopaedic Surgery | Admitting: Orthopaedic Surgery

## 2022-07-17 ENCOUNTER — Ambulatory Visit (HOSPITAL_BASED_OUTPATIENT_CLINIC_OR_DEPARTMENT_OTHER): Payer: PRIVATE HEALTH INSURANCE

## 2022-07-17 ENCOUNTER — Encounter (HOSPITAL_BASED_OUTPATIENT_CLINIC_OR_DEPARTMENT_OTHER): Payer: Self-pay | Admitting: Orthopaedic Surgery

## 2022-07-17 DIAGNOSIS — J4540 Moderate persistent asthma, uncomplicated: Secondary | ICD-10-CM | POA: Insufficient documentation

## 2022-07-17 DIAGNOSIS — J45909 Unspecified asthma, uncomplicated: Secondary | ICD-10-CM | POA: Diagnosis not present

## 2022-07-17 DIAGNOSIS — Z6841 Body Mass Index (BMI) 40.0 and over, adult: Secondary | ICD-10-CM | POA: Diagnosis not present

## 2022-07-17 DIAGNOSIS — I10 Essential (primary) hypertension: Secondary | ICD-10-CM

## 2022-07-17 DIAGNOSIS — Z472 Encounter for removal of internal fixation device: Secondary | ICD-10-CM

## 2022-07-17 DIAGNOSIS — S82842A Displaced bimalleolar fracture of left lower leg, initial encounter for closed fracture: Secondary | ICD-10-CM | POA: Diagnosis present

## 2022-07-17 DIAGNOSIS — Z01818 Encounter for other preprocedural examination: Secondary | ICD-10-CM

## 2022-07-17 HISTORY — DX: Personal history of diseases of the blood and blood-forming organs and certain disorders involving the immune mechanism: Z86.2

## 2022-07-17 HISTORY — DX: Allergic urticaria: L50.0

## 2022-07-17 HISTORY — PX: HARDWARE REMOVAL: SHX979

## 2022-07-17 HISTORY — DX: Other allergic rhinitis: J30.89

## 2022-07-17 LAB — POCT PREGNANCY, URINE: Preg Test, Ur: NEGATIVE

## 2022-07-17 LAB — POCT I-STAT, CHEM 8
BUN: 13 mg/dL (ref 6–20)
Calcium, Ion: 1.2 mmol/L (ref 1.15–1.40)
Chloride: 100 mmol/L (ref 98–111)
Creatinine, Ser: 0.8 mg/dL (ref 0.44–1.00)
Glucose, Bld: 94 mg/dL (ref 70–99)
HCT: 40 % (ref 36.0–46.0)
Hemoglobin: 13.6 g/dL (ref 12.0–15.0)
Potassium: 3 mmol/L — ABNORMAL LOW (ref 3.5–5.1)
Sodium: 140 mmol/L (ref 135–145)
TCO2: 27 mmol/L (ref 22–32)

## 2022-07-17 SURGERY — REMOVAL, HARDWARE
Anesthesia: General | Site: Ankle | Laterality: Left

## 2022-07-17 MED ORDER — CEFAZOLIN SODIUM-DEXTROSE 2-4 GM/100ML-% IV SOLN
2.0000 g | INTRAVENOUS | Status: AC
  Administered 2022-07-17: 2 g via INTRAVENOUS

## 2022-07-17 MED ORDER — PROPOFOL 10 MG/ML IV BOLUS
INTRAVENOUS | Status: DC | PRN
  Administered 2022-07-17: 200 mg via INTRAVENOUS

## 2022-07-17 MED ORDER — MIDAZOLAM HCL 5 MG/5ML IJ SOLN
INTRAMUSCULAR | Status: DC | PRN
  Administered 2022-07-17: 2 mg via INTRAVENOUS

## 2022-07-17 MED ORDER — FENTANYL CITRATE (PF) 100 MCG/2ML IJ SOLN
INTRAMUSCULAR | Status: AC
  Filled 2022-07-17: qty 2

## 2022-07-17 MED ORDER — BUPIVACAINE HCL 0.25 % IJ SOLN
INTRAMUSCULAR | Status: DC | PRN
  Administered 2022-07-17: 10 mL

## 2022-07-17 MED ORDER — CHLORHEXIDINE GLUCONATE 4 % EX SOLN: 60.0000 mL | Freq: Once | CUTANEOUS | Status: DC

## 2022-07-17 MED ORDER — DEXAMETHASONE SODIUM PHOSPHATE 10 MG/ML IJ SOLN
INTRAMUSCULAR | Status: DC | PRN
  Administered 2022-07-17: 5 mg via INTRAVENOUS

## 2022-07-17 MED ORDER — SODIUM CHLORIDE 0.9 % IV SOLN: INTRAVENOUS | Status: DC

## 2022-07-17 MED ORDER — CELECOXIB 200 MG PO CAPS
200.0000 mg | ORAL_CAPSULE | Freq: Once | ORAL | Status: AC
  Administered 2022-07-17: 200 mg via ORAL

## 2022-07-17 MED ORDER — CELECOXIB 200 MG PO CAPS
ORAL_CAPSULE | ORAL | Status: AC
  Filled 2022-07-17: qty 1

## 2022-07-17 MED ORDER — HYDROMORPHONE HCL 2 MG PO TABS
ORAL_TABLET | ORAL | Status: AC
  Filled 2022-07-17: qty 1

## 2022-07-17 MED ORDER — ACETAMINOPHEN 500 MG PO TABS
1000.0000 mg | ORAL_TABLET | Freq: Once | ORAL | Status: AC
  Administered 2022-07-17: 1000 mg via ORAL

## 2022-07-17 MED ORDER — MIDAZOLAM HCL 2 MG/2ML IJ SOLN
INTRAMUSCULAR | Status: AC
  Filled 2022-07-17: qty 2

## 2022-07-17 MED ORDER — LIDOCAINE 2% (20 MG/ML) 5 ML SYRINGE
INTRAMUSCULAR | Status: DC | PRN
  Administered 2022-07-17: 100 mg via INTRAVENOUS

## 2022-07-17 MED ORDER — ACETAMINOPHEN 500 MG PO TABS
ORAL_TABLET | ORAL | Status: AC
  Filled 2022-07-17: qty 2

## 2022-07-17 MED ORDER — ONDANSETRON HCL 4 MG/2ML IJ SOLN
INTRAMUSCULAR | Status: DC | PRN
  Administered 2022-07-17: 4 mg via INTRAVENOUS

## 2022-07-17 MED ORDER — AMISULPRIDE (ANTIEMETIC) 5 MG/2ML IV SOLN: 10.0000 mg | Freq: Once | INTRAVENOUS | Status: DC | PRN

## 2022-07-17 MED ORDER — 0.9 % SODIUM CHLORIDE (POUR BTL) OPTIME
TOPICAL | Status: DC | PRN
  Administered 2022-07-17: 500 mL

## 2022-07-17 MED ORDER — PROPOFOL 10 MG/ML IV BOLUS
INTRAVENOUS | Status: AC
  Filled 2022-07-17: qty 20

## 2022-07-17 MED ORDER — ONDANSETRON HCL 4 MG/2ML IJ SOLN
INTRAMUSCULAR | Status: AC
  Filled 2022-07-17: qty 2

## 2022-07-17 MED ORDER — CEFAZOLIN SODIUM-DEXTROSE 2-4 GM/100ML-% IV SOLN
INTRAVENOUS | Status: AC
  Filled 2022-07-17: qty 100

## 2022-07-17 MED ORDER — FENTANYL CITRATE (PF) 100 MCG/2ML IJ SOLN
25.0000 ug | INTRAMUSCULAR | Status: DC | PRN
  Administered 2022-07-17: 25 ug via INTRAVENOUS

## 2022-07-17 MED ORDER — DEXAMETHASONE SODIUM PHOSPHATE 10 MG/ML IJ SOLN
INTRAMUSCULAR | Status: AC
  Filled 2022-07-17: qty 1

## 2022-07-17 MED ORDER — HYDROMORPHONE HCL 2 MG PO TABS
2.0000 mg | ORAL_TABLET | Freq: Once | ORAL | Status: AC
  Administered 2022-07-17: 2 mg via ORAL

## 2022-07-17 MED ORDER — LIDOCAINE HCL (PF) 2 % IJ SOLN
INTRAMUSCULAR | Status: AC
  Filled 2022-07-17: qty 5

## 2022-07-17 MED ORDER — FENTANYL CITRATE (PF) 100 MCG/2ML IJ SOLN
INTRAMUSCULAR | Status: DC | PRN
  Administered 2022-07-17: 50 ug via INTRAVENOUS

## 2022-07-17 SURGICAL SUPPLY — 57 items
APL PRP STRL LF DISP 70% ISPRP (MISCELLANEOUS) ×2
BANDAGE ESMARK 6X9 LF (GAUZE/BANDAGES/DRESSINGS) IMPLANT
BLADE SURG 10 STRL SS (BLADE) IMPLANT
BLADE SURG 15 STRL LF DISP TIS (BLADE) ×1 IMPLANT
BLADE SURG 15 STRL SS (BLADE) ×2
BNDG CMPR 9X6 STRL LF SNTH (GAUZE/BANDAGES/DRESSINGS) ×1
BNDG CMPR MED 15X6 ELC VLCR LF (GAUZE/BANDAGES/DRESSINGS) ×1
BNDG ELASTIC 6X15 VLCR STRL LF (GAUZE/BANDAGES/DRESSINGS) IMPLANT
BNDG ESMARK 6X9 LF (GAUZE/BANDAGES/DRESSINGS) ×1
BNDG GAUZE DERMACEA FLUFF 4 (GAUZE/BANDAGES/DRESSINGS) IMPLANT
BNDG GZE DERMACEA 4 6PLY (GAUZE/BANDAGES/DRESSINGS) ×1
BRUSH SCRUB EZ  4% CHG (MISCELLANEOUS) ×1
BRUSH SCRUB EZ 4% CHG (MISCELLANEOUS) ×1 IMPLANT
CHLORAPREP W/TINT 26 (MISCELLANEOUS) ×1 IMPLANT
CNTNR URN SCR LID CUP LEK RST (MISCELLANEOUS) IMPLANT
CONT SPEC 4OZ STRL OR WHT (MISCELLANEOUS) ×1
COVER MAYO STAND STRL (DRAPES) IMPLANT
CUFF TOURN SGL QUICK 34 (TOURNIQUET CUFF) ×1
CUFF TRNQT CYL 34X4.125X (TOURNIQUET CUFF) ×1 IMPLANT
DRAPE C-ARM 35X43 STRL (DRAPES) IMPLANT
DRAPE C-ARM 42X120 X-RAY (DRAPES) IMPLANT
DRAPE EXTREMITY T 121X128X90 (DISPOSABLE) IMPLANT
DRAPE INCISE IOBAN 66X45 STRL (DRAPES) ×1 IMPLANT
DRAPE SHEET LG 3/4 BI-LAMINATE (DRAPES) ×1 IMPLANT
DRAPE U-SHAPE 47X51 STRL (DRAPES) ×1 IMPLANT
DRSG ADAPTIC 3X8 NADH LF (GAUZE/BANDAGES/DRESSINGS) IMPLANT
DRSG MEPITEL 8X12 (GAUZE/BANDAGES/DRESSINGS) IMPLANT
ELECT REM PT RETURN 9FT ADLT (ELECTROSURGICAL)
ELECTRODE REM PT RTRN 9FT ADLT (ELECTROSURGICAL) ×1 IMPLANT
GAUZE 4X4 16PLY ~~LOC~~+RFID DBL (SPONGE) ×1 IMPLANT
GAUZE PAD ABD 8X10 STRL (GAUZE/BANDAGES/DRESSINGS) IMPLANT
GAUZE SPONGE 4X4 12PLY STRL (GAUZE/BANDAGES/DRESSINGS) ×1 IMPLANT
GLOVE BIO SURGEON STRL SZ7.5 (GLOVE) ×2 IMPLANT
GOWN STRL REUS W/TWL XL LVL3 (GOWN DISPOSABLE) ×2 IMPLANT
KIT TURNOVER CYSTO (KITS) ×1 IMPLANT
Mepitel 20x30 IMPLANT
NDL HYPO 22X1.5 SAFETY MO (MISCELLANEOUS) IMPLANT
NEEDLE HYPO 22X1.5 SAFETY MO (MISCELLANEOUS) IMPLANT
NS IRRIG 500ML POUR BTL (IV SOLUTION) ×1 IMPLANT
PACK ARTHROSCOPY DSU (CUSTOM PROCEDURE TRAY) ×1 IMPLANT
PACK BASIN DAY SURGERY FS (CUSTOM PROCEDURE TRAY) ×1 IMPLANT
PADDING CAST COTTON 6X4 STRL (CAST SUPPLIES) IMPLANT
PADDING CAST SYNTHETIC 4X4 STR (CAST SUPPLIES) IMPLANT
PENCIL SMOKE EVACUATOR (MISCELLANEOUS) ×1 IMPLANT
SLEEVE SCD COMPRESS KNEE MED (STOCKING) ×1 IMPLANT
SPONGE T-LAP 4X18 ~~LOC~~+RFID (SPONGE) IMPLANT
STOCKINETTE 6  STRL (DRAPES)
STOCKINETTE 6 STRL (DRAPES) IMPLANT
STRIP CLOSURE SKIN 1/2X4 (GAUZE/BANDAGES/DRESSINGS) ×1 IMPLANT
SUT MNCRL AB 3-0 PS2 27 (SUTURE) IMPLANT
SUT VIC AB 1 CT1 36 (SUTURE) IMPLANT
SUT VIC AB 2-0 CT1 27 (SUTURE) ×1
SUT VIC AB 2-0 CT1 TAPERPNT 27 (SUTURE) IMPLANT
SYR CONTROL 10ML LL (SYRINGE) IMPLANT
TOWEL OR 17X24 6PK STRL BLUE (TOWEL DISPOSABLE) ×1 IMPLANT
TUBE CONNECTING 12X1/4 (SUCTIONS) ×1 IMPLANT
YANKAUER SUCT BULB TIP NO VENT (SUCTIONS) ×1 IMPLANT

## 2022-07-17 NOTE — Transfer of Care (Signed)
Immediate Anesthesia Transfer of Care Note  Patient: Victoria George  Procedure(s) Performed: left ankle elective removal of hardware (syndesmotic screw) (Left: Ankle)  Patient Location: PACU  Anesthesia Type:General  Level of Consciousness: drowsy  Airway & Oxygen Therapy: Patient Spontanous Breathing and Patient connected to nasal cannula oxygen  Post-op Assessment: Report given to RN  Post vital signs: Reviewed and stable  Last Vitals:  Vitals Value Taken Time  BP 130/89 07/17/22 1108  Temp    Pulse 81 07/17/22 1110  Resp 13 07/17/22 1110  SpO2 100 % 07/17/22 1110  Vitals shown include unvalidated device data.  Last Pain:  Vitals:   07/17/22 0844  TempSrc: Oral  PainSc: 0-No pain      Patients Stated Pain Goal: 4 (07/17/22 0844)  Complications: No notable events documented.

## 2022-07-17 NOTE — Anesthesia Preprocedure Evaluation (Addendum)
Anesthesia Evaluation  Patient identified by MRN, date of birth, ID band Patient awake    Reviewed: Allergy & Precautions, NPO status , Patient's Chart, lab work & pertinent test results  History of Anesthesia Complications Negative for: history of anesthetic complications  Airway Mallampati: II  TM Distance: >3 FB Neck ROM: Full    Dental  (+) Dental Advisory Given, Teeth Intact   Pulmonary asthma    Pulmonary exam normal        Cardiovascular hypertension, Pt. on medications Normal cardiovascular exam     Neuro/Psych negative neurological ROS  negative psych ROS   GI/Hepatic negative GI ROS, Neg liver ROS,,,  Endo/Other    Morbid obesity  Renal/GU negative Renal ROS     Musculoskeletal negative musculoskeletal ROS (+)    Abdominal   Peds  Hematology negative hematology ROS (+)   Anesthesia Other Findings   Reproductive/Obstetrics                             Anesthesia Physical Anesthesia Plan  ASA: 3  Anesthesia Plan: General   Post-op Pain Management: Tylenol PO (pre-op)* and Celebrex PO (pre-op)*   Induction: Intravenous  PONV Risk Score and Plan: 3 and Treatment may vary due to age or medical condition, Ondansetron, Dexamethasone and Midazolam  Airway Management Planned: LMA  Additional Equipment: None  Intra-op Plan:   Post-operative Plan: Extubation in OR  Informed Consent: I have reviewed the patients History and Physical, chart, labs and discussed the procedure including the risks, benefits and alternatives for the proposed anesthesia with the patient or authorized representative who has indicated his/her understanding and acceptance.     Dental advisory given  Plan Discussed with: CRNA and Anesthesiologist  Anesthesia Plan Comments:         Anesthesia Quick Evaluation

## 2022-07-17 NOTE — Anesthesia Postprocedure Evaluation (Signed)
Anesthesia Post Note  Patient: Victoria George  Procedure(s) Performed: left ankle elective removal of hardware (syndesmotic screw) (Left: Ankle)     Patient location during evaluation: PACU Anesthesia Type: General Level of consciousness: awake and alert Pain management: pain level controlled Vital Signs Assessment: post-procedure vital signs reviewed and stable Respiratory status: spontaneous breathing, nonlabored ventilation and respiratory function stable Cardiovascular status: stable and blood pressure returned to baseline Anesthetic complications: no   No notable events documented.  Last Vitals:  Vitals:   07/17/22 1130 07/17/22 1145  BP: 105/80   Pulse:    Resp:    Temp:  (!) 36.3 C  SpO2:      Last Pain:  Vitals:   07/17/22 1215  TempSrc:   PainSc: 6                  Beryle Lathe

## 2022-07-17 NOTE — Op Note (Addendum)
07/17/2022  11:19 AM   PATIENT: Victoria George  47 y.o. female  MRN: [id number]   PRE-OPERATIVE DIAGNOSIS:   Closed bimalleolar fracture of left ankle with syndesmotic disruption s/p ORIF   POST-OPERATIVE DIAGNOSIS:   Same   PROCEDURE: Left ankle elective removal of hardware (syndesmotic screw x1)   SURGEON:  Netta Cedars, MD   ASSISTANT: None   ANESTHESIA: General, regional   EBL: Minimal   TOURNIQUET:   None used   COMPLICATIONS: None apparent   DISPOSITION: Extubated, awake and stable to recovery.   INDICATION FOR PROCEDURE: The patient presented with above diagnosis.  We discussed the diagnosis, alternative treatment options, risks and benefits of the above surgical intervention, as well as alternative non-operative treatments. All questions/concerns were addressed and the patient/family demonstrated appropriate understanding of the diagnosis, the procedure, the postoperative course, and overall prognosis. The patient wished to proceed with surgical intervention and signed an informed surgical consent as such, in each others presence prior to surgery.   PROCEDURE IN DETAIL: After preoperative consent was obtained and the correct operative site was identified, the patient was brought to the operating room supine on stretcher and transferred onto operating table. General anesthesia was induced. Preoperative antibiotics were administered. Surgical timeout was taken. The patient was then positioned supine with an ipsilateral hip bump. The operative lower extremity was prepped and draped in standard sterile fashion with a tourniquet around the thigh. However, the tourniquet was not utilized in this case.  The prior surgical scar was used to approach the lateral ankle directly over the syndesmosis screw head. Dissection was carefully carried down to the screw. The appropriate screwdriver was used to remove the screw and this came out completely without any  issues. The screw hole was curetted.  A manual external rotation stress radiograph was obtained and demonstrated symmetry of the ankle mortise with complete stability to testing.  The surgical sites were thoroughly irrigated and vancomycin powder applied. The tourniquet was deflated and hemostasis achieved. The deep layers were closed using 2-0 vicryl. The skin was closed without tension using 2-0 nylon suture.    The leg was cleaned with saline and sterile dressings with gauze were applied. A well padded bulky compressive wrap and CAM boot were applied. The patient was awakened from anesthesia and transported to the recovery room in stable condition.    FOLLOW UP PLAN: -transfer to PACU, then home -once block wears off, she may be WBAT operative extremity, maximum elevation -maintain dressings until follow up -DVT ppx: Aspirin 81 mg twice daily x 4 wk -follow up as outpatient on Fri 4/26 for wound check -sutures out in 3 weeks in outpatient office   RADIOGRAPHS: AP, lateral, oblique and stress radiographs of the left ankle were obtained intraoperatively. These showed interval complete removal of syndesmotic screw. Manual stress radiographs were taken and the ankle mortise and tibiofibular relationship were noted to be stable following fixation. All remaining hardware is appropriately positioned and of the appropriate lengths. No other acute injuries are noted.   Netta Cedars Orthopaedic Surgery [facility]

## 2022-07-17 NOTE — Anesthesia Procedure Notes (Signed)
Procedure Name: LMA Insertion Date/Time: 07/17/2022 10:30 AM  Performed by: Briant Sites, CRNAPre-anesthesia Checklist: Patient identified, Emergency Drugs available, Suction available and Patient being monitored Patient Re-evaluated:Patient Re-evaluated prior to induction Oxygen Delivery Method: Circle system utilized Preoxygenation: Pre-oxygenation with 100% oxygen Induction Type: IV induction Ventilation: Mask ventilation without difficulty LMA: LMA inserted LMA Size: 4.0 Number of attempts: 1 Airway Equipment and Method: Bite block Placement Confirmation: positive ETCO2 Tube secured with: Tape Dental Injury: Teeth and Oropharynx as per pre-operative assessment

## 2022-07-17 NOTE — H&P (Signed)
H&P Update:  -History and Physical Reviewed  -Patient has been re-examined  -No change in the plan of care. Today we are removing the left ankle syndesmotic screw and retaining all other hardware.  -The risks and benefits were presented and reviewed. The risks due to screw breakage during retrieval, recurrent instability, partial/total inability to removal part/all of hardware, hardware/suture failure and/or irritation, new/persistent infection, stiffness, nerve/vessel/tendon injury or rerupture of repaired tendon, nonunion/malunion, allograft usage, wound healing issues, development of arthritis, failure of this surgery, possibility of external fixation with delayed definitive surgery, need for further surgery, thromboembolic events, anesthesia/medical complications, amputation, death among others were discussed. The patient acknowledged the explanation, agreed to proceed with the plan and a consent was signed.  Victoria George

## 2022-07-18 ENCOUNTER — Encounter (HOSPITAL_BASED_OUTPATIENT_CLINIC_OR_DEPARTMENT_OTHER): Payer: Self-pay | Admitting: Orthopaedic Surgery

## 2022-09-02 ENCOUNTER — Emergency Department (HOSPITAL_BASED_OUTPATIENT_CLINIC_OR_DEPARTMENT_OTHER)
Admission: EM | Admit: 2022-09-02 | Discharge: 2022-09-02 | Disposition: A | Discharge status: Home / Self Care | Payer: PRIVATE HEALTH INSURANCE | Attending: Emergency Medicine | Admitting: Emergency Medicine

## 2022-09-02 ENCOUNTER — Other Ambulatory Visit: Payer: Self-pay

## 2022-09-02 ENCOUNTER — Encounter (HOSPITAL_BASED_OUTPATIENT_CLINIC_OR_DEPARTMENT_OTHER): Payer: Self-pay

## 2022-09-02 DIAGNOSIS — I10 Essential (primary) hypertension: Secondary | ICD-10-CM | POA: Insufficient documentation

## 2022-09-02 DIAGNOSIS — H9202 Otalgia, left ear: Secondary | ICD-10-CM | POA: Diagnosis present

## 2022-09-02 DIAGNOSIS — M542 Cervicalgia: Secondary | ICD-10-CM | POA: Diagnosis not present

## 2022-09-02 MED ORDER — PREDNISONE 10 MG (21) PO TBPK: ORAL_TABLET | Freq: Every day | ORAL | 0 refills | Status: DC

## 2022-09-02 NOTE — ED Triage Notes (Signed)
Pt. C/o left-sided earache. She is ambulatory and in no distress. She denies fever.

## 2022-09-02 NOTE — Discharge Instructions (Signed)
Please take your medications as prescribed. Take tylenol/ibuprofen every 6 hours for pain. I recommend close follow-up with PCP for reevaluation.  Please do not hesitate to return to emergency department if worrisome signs symptoms we discussed become apparent.

## 2022-09-02 NOTE — ED Provider Notes (Signed)
[place] EMERGENCY DEPARTMENT AT [facility] Provider Note   CSN: [id number] Arrival date & time: 09/02/22  4098     History  Chief Complaint  Patient presents with   Otalgia    Victoria George is a 47 y.o. female with a past medical history of hypertension, allergic rhinitis presents today for evaluation of ear pain.  Patient reports she has had left-sided knee pain for 3 days.  States symptoms started after she drank a watermelon red bull.  States she is allergic to pineapple and orange.  States she first felt some neck pain then it radiated to her left ear.  Endorses headache and dizziness.  Denies any recent fall or head injury.  Denies any nausea or vomiting.  Patient was seen at urgent care yesterday.  She tested negative for strep pharyngitis.   Otalgia     Past Medical History:  Diagnosis Date   Allergic urticaria    H/O hypokalemia 03/2021   ED visit due to syncope w/ LOC ,  potassium level 2.7 felt due to taking HCTZ   History of gastritis    12/ 2012   History of iron deficiency anemia    History of ovarian cyst    Hypertension    Moderate persistent asthma, uncomplicated    [facility]-- dr Delorse Lek   Non-seasonal allergic rhinitis due to other allergic trigger    Seasonal and perennial allergic rhinitis    Submucous leiomyoma of uterus    Wears glasses    Past Surgical History:  Procedure Laterality Date   ALLOGRAFT APPLICATION Left 12/28/2021   Procedure: ALLOGRAFT APPLICATION;  Surgeon: Netta Cedars, MD;  Location: [facility] OR;  Service: Orthopedics;  Laterality: Left;   CESAREAN SECTION N/A 03/02/2019   Procedure: CESAREAN SECTION;  Surgeon: Edwinna Areola, DO;  Location: [facility] ORS;  Service: Obstetrics;  Laterality: N/A;   CHOLECYSTECTOMY  02/01/2011   Procedure: LAPAROSCOPIC CHOLECYSTECTOMY WITH INTRAOPERATIVE CHOLANGIOGRAM;  Surgeon: Adolph Pollack, MD;  Location: [facility] ORS;  Service: General;  Laterality: N/A;  Needs IOC  and C-arm   DIAGNOSTIC LAPAROSCOPY  07/2007   ?Lysis Adhesions for obstructive right fallopian tube   ESOPHAGOGASTRODUODENOSCOPY  01/30/2011   Procedure: ESOPHAGOGASTRODUODENOSCOPY (EGD);  Surgeon: Erick Blinks, MD;  Location: [facility];  Service: Gastroenterology;  Laterality: N/A;   ESOPHAGOGASTRODUODENOSCOPY  03/03/2011   Procedure: ESOPHAGOGASTRODUODENOSCOPY (EGD);  Surgeon: Erick Blinks, MD;  Location: [facility];  Service: Gastroenterology;  Laterality: N/A;   HARDWARE REMOVAL Left 07/17/2022   Procedure: left ankle elective removal of hardware (syndesmotic screw);  Surgeon: Netta Cedars, MD;  Location: [facility];  Service: Orthopedics;  Laterality: Left;   HYSTEROSCOPY N/A 03/22/2016   Procedure: HYSTEROSCOPY, POLYPECTOMY;  Surgeon: Fermin Schwab, MD;  Location: [facility];  Service: Gynecology;  Laterality: N/A;   HYSTEROSCOPY WITH RESECTOSCOPE N/A 09/19/2017   Procedure: HYSTEROSCOPY MYOMECTOMY;  Surgeon: Fermin Schwab, MD;  Location: [facility];  Service: Gynecology;  Laterality: N/A;   LAPAROSCOPIC GELPORT ASSISTED MYOMECTOMY N/A 03/22/2016   Procedure: LAPAROSCOPIC GELPORT ASSISTED MYOMECTOMY, LYSIS OF ADHESIONS, CHROMOPERTUBATION;  Surgeon: Fermin Schwab, MD;  Location: [facility];  Service: Gynecology;  Laterality: N/A;   ORIF ANKLE FRACTURE Left 12/28/2021   Procedure: OPEN REDUCTION INTERNAL FIXATION (ORIF) left trimalleolar ANKLE FRACTURE syndesmosis fixation;  Surgeon: Netta Cedars, MD;  Location: [facility] OR;  Service: Orthopedics;  Laterality: Left;  120 min   SYNDESMOSIS REPAIR Left 12/28/2021   Procedure: SYNDESMOSIS REPAIR;  Surgeon: Netta Cedars, MD;  Location: [facility] OR;  Service: Orthopedics;  Laterality: Left;     Home Medications Prior to Admission medications   Medication Sig Start Date End Date Taking? Authorizing Provider  acetaminophen (TYLENOL) 500 MG tablet Take 500 mg by  mouth every 4 (four) hours as needed (pain.).    [provider]  albuterol (VENTOLIN HFA) 108 (90 Base) MCG/ACT inhaler Inhale 2 puffs into the lungs every 6 (six) hours as needed for wheezing or shortness of breath. 04/19/22   Padgett, Pilar Grammes, MD  EPINEPHrine (EPIPEN 2-PAK) 0.3 mg/0.3 mL IJ SOAJ injection Inject 0.3 mg into the muscle as needed for anaphylaxis. 04/19/22   Marcelyn Bruins, MD  hydrochlorothiazide (HYDRODIURIL) 25 MG tablet Take 25 mg by mouth in the morning.    [provider]  ibuprofen (ADVIL) 200 MG tablet Take 200 mg by mouth every 4 (four) hours as needed (pain.).    [provider]  montelukast (SINGULAIR) 10 MG tablet Take 1 tablet (10 mg total) by mouth at bedtime. 04/19/22   Marcelyn Bruins, MD  Multiple Vitamins-Minerals (ADULT ONE DAILY GUMMIES PO) Take 2 tablets by mouth in the morning.    [provider]  naloxone [facility]) nasal spray 4 mg/0.1 mL Use once in the nose as needed for decreased responsiveness Patient not taking: Reported on 07/12/2022 12/15/21   Lonell Grandchild, MD  Potassium 99 MG TABS Take 99 mg by mouth in the morning. OTC    [provider]  SYMBICORT 80-4.5 MCG/ACT inhaler 2 puffs 2 times daily for 2 weeks at a time during worsening symptoms or respiratory infections. Patient taking differently: Inhale 2 puffs into the lungs as directed. 2 puffs 2 times daily for 2 weeks at a time during worsening symptoms or respiratory infections. 04/19/22   Marcelyn Bruins, MD      Allergies    Phenergan [promethazine], Shellfish allergy, Bee pollen, Codeine, K-tab [potassium chloride er], Milk-related compounds, Other, Oxycodone, Tramadol, and Hydrocodone    Review of Systems   Review of Systems  HENT:  Positive for ear pain.     Physical Exam Updated Vital Signs BP (!) 149/91 (BP Location: Right Arm)   Pulse 78   Temp 97.8 F (36.6 C) (Oral)   Resp 16   LMP  (LMP  Unknown)   SpO2 100%  Physical Exam Vitals and nursing note reviewed.  Constitutional:      Appearance: Normal appearance.  HENT:     Head: Normocephalic and atraumatic.     Right Ear: Tympanic membrane normal.     Left Ear: Tympanic membrane normal.     Ears:     Comments: Left tympanic membrane normal.  No redness or bulging.    Mouth/Throat:     Mouth: Mucous membranes are moist.  Eyes:     General: No scleral icterus. Cardiovascular:     Rate and Rhythm: Normal rate and regular rhythm.     Pulses: Normal pulses.     Heart sounds: Normal heart sounds.  Pulmonary:     Effort: Pulmonary effort is normal.     Breath sounds: Normal breath sounds.  Abdominal:     General: Abdomen is flat.     Palpations: Abdomen is soft.     Tenderness: There is no abdominal tenderness.  Musculoskeletal:        General: No deformity.  Skin:    General: Skin is warm.     Findings: No rash.  Neurological:     General: No focal deficit present.     Mental Status: She is alert.  Psychiatric:        Mood and Affect: Mood normal.     ED Results / Procedures / Treatments   Labs (all labs ordered are listed, but only abnormal results are displayed) Labs Reviewed - No data to display  EKG None  Radiology No results found.  Procedures Procedures    Medications Ordered in ED Medications - No data to display  ED Course/ Medical Decision Making/ A&P                             Medical Decision Making  This patient presents to the ED for ear pain, neck pain, this involves an extensive number of treatment options, and is a complaint that carries with a high risk of complications and morbidity.  The differential diagnosis includes COVID, flu, RSV, strep, acute otitis media, acute otitis externa, musculoskeletal pain, tension type headache, migraines.  This is not an exhaustive list.  Problem list/ ED course/ Critical interventions/ Medical management: HPI: See above Vital signs  within normal range and stable throughout visit. Laboratory/imaging studies significant for: See above. On physical examination, patient is afebrile and appears in no acute distress.  This patient presents after having left ear pain for 3 days.  Patient states her symptoms started after she drinking a watermelon redbull.  She was not sure if she is allergic to this.  She said that she was allergic to many things including orange, pineapple.  When symptoms first started she felt pain in her neck first, then it radiated to her left ear.  On my examination her tympanic membrane was normal, there was no redness or bulging.  Unlikely acute otitis media or externa.  Likely viral illness versus allergic reaction.  Will send a short course of tapered steroid.  Clinical presentation not consistent with anaphylaxis.  Patient is well-appearing and is stable for discharge.  Advised patient to take Tylenol/ibuprofen/naproxen for pain, follow-up with primary care physician for further evaluation and management, return to the ER if new or worsening symptoms.  I have reviewed the patient home medicines and have made adjustments as needed.  Cardiac monitoring/EKG: The patient was maintained on a cardiac monitor.  I personally reviewed and interpreted the cardiac monitor which showed an underlying rhythm of: sinus rhythm.  Additional history obtained: External records from outside source obtained and reviewed including: Chart review including previous notes, labs, imaging.  Consultations obtained:  Disposition Continued outpatient therapy. Follow-up with PCP recommended for reevaluation of symptoms. Treatment plan discussed with patient.  Pt acknowledged understanding was agreeable to the plan. Worrisome signs and symptoms were discussed with patient, and patient acknowledged understanding to return to the ED if they noticed these signs and symptoms. Patient was stable upon discharge.   This chart was dictated using  voice recognition software.  Despite best efforts to proofread,  errors can occur which can change the documentation meaning.          Final Clinical Impression(s) / ED Diagnoses Final diagnoses:  Left ear pain  Neck pain    Rx / DC Orders ED Discharge Orders     None         Jeanelle Malling, [place] 09/02/22 6045    Mardene Sayer, MD 09/02/22 [phone]

## 2022-10-18 ENCOUNTER — Encounter: Payer: Self-pay | Admitting: Allergy

## 2022-10-18 ENCOUNTER — Ambulatory Visit (INDEPENDENT_AMBULATORY_CARE_PROVIDER_SITE_OTHER): Payer: PRIVATE HEALTH INSURANCE | Admitting: Allergy

## 2022-10-18 VITALS — BP 140/68 | HR 81 | Ht 61.0 in | Wt 239.8 lb

## 2022-10-18 DIAGNOSIS — L239 Allergic contact dermatitis, unspecified cause: Secondary | ICD-10-CM

## 2022-10-18 DIAGNOSIS — J4520 Mild intermittent asthma, uncomplicated: Secondary | ICD-10-CM | POA: Diagnosis not present

## 2022-10-18 DIAGNOSIS — H1013 Acute atopic conjunctivitis, bilateral: Secondary | ICD-10-CM

## 2022-10-18 DIAGNOSIS — L500 Allergic urticaria: Secondary | ICD-10-CM

## 2022-10-18 DIAGNOSIS — Z91018 Allergy to other foods: Secondary | ICD-10-CM

## 2022-10-18 DIAGNOSIS — J3089 Other allergic rhinitis: Secondary | ICD-10-CM | POA: Diagnosis not present

## 2022-10-18 MED ORDER — BUDESONIDE-FORMOTEROL FUMARATE 80-4.5 MCG/ACT IN AERO: 2.0000 | INHALATION_SPRAY | Freq: Two times a day (BID) | RESPIRATORY_TRACT | 5 refills | Status: AC

## 2022-10-18 NOTE — Patient Instructions (Addendum)
Contact allergic reaction - hives, swelling - you are reactive to the chemicals in synthetic hair and should always opt for the ACV soak prior to install and/or use human hair - hives/swelling can be caused by a variety of different triggers including illness/infection, foods, medications, stings, exercise, pressure, vibrations, extremes of temperature to name a few however majority of the time there is no identifiable trigger.   Thus it is possible to have future episodes of hives/swelling for other reasons.  - if hives/swelling return recommend you take Allegra or Zyrtec 1 tab 1-2 times a day with Pepcid 1 tab 1- times a day until symptoms resolve  Moderate persistent asthma without complication - Prior to physical activity: albuterol 2 puffs 10-15 minutes before physical activity. - Rescue medications: albuterol 2 puffs or Symbicort 1-2 puffs every 4-6 hours as needed - Changes during respiratory infections or worsening symptoms: Add on Symbicort 2 puffs twice a day for TWO WEEKS. - If not meeting below goals then take Symbicort 2 puffs twice a day as maintenance therapy.  Symbicort can be used as both maintenance and rescue medication.  Max dosing is 10-12 puffs a day  - Asthma control goals:  * Full participation in all desired activities (may need albuterol before activity) * Albuterol use two time or less a week on average (not counting use with activity) * Cough interfering with sleep two time or less a month * Oral steroids no more than once a year * No hospitalizations  Allergic rhinoconjunctivitis - Continue avoidance measures for  tree, weed, and grass pollens, molds, dog, dust mite  - Continue Zyrtec or Allegra as needed  Anaphylactic shock due to food - Continue to avoid crab, shrimp, lobster, orange, milk, beef, and, pineapple - IgE to milk and beef are positive.   - Carry EpiPen at all times - you are eligible for in-office food challenge to confirm if not allergic if  you become interested   Follow up in 6 months or sooner as needed

## 2022-10-18 NOTE — Progress Notes (Signed)
Follow-up Note  RE: ANGI GOODELL MRN: [id number] DOB: October 14, 1975 Date of Office Visit: 10/18/2022   History of present illness: Victoria George is a 47 y.o. female presenting today for follow-up of contact allergy to synthetic hair, asthma, allergic rhinitis with conjunctivitis and food allergy.  She was last seen in the office on 04/19/22 by myself.   She has avoided hairstyles with added hair since last visit.  She has even cut her hair in a shorter style.  She plans to only use stylist who will follow recommendations she provides in regards to pre-soaking hair prior to installation.  She is still recovering from her foot/ankle injury and thus has been staying inside more.  With that she has not noted a lot of increase in allergy symptoms.  She does feel antihistamine use of effective.  She did use albuterol 2 nights ago for shortness of breath as her husband washed closed with too much bleach and that odor was triggering.  Otherwise does not recall any other albuterol need since last visit. She has not had any respiratory illness or need to initiate symbicort use.  She has not had any ED/UC or systemic steroid needs since last visit.     She continues to avoid variety of foods including shellfish, orange, milk, beef, pineapple, crangrape products.  She is not interested at this time in performing food challenges to any of these.  She does have access to epinephrine device of which she has not needed to use.   Review of systems: 10-pt ROS negative unless noted above in HPI  Past medical/social/surgical/family history have been reviewed and are unchanged unless specifically indicated below.  No changes  Medication List: Current Outpatient Medications  Medication Sig Dispense Refill   acetaminophen (TYLENOL) 500 MG tablet Take 500 mg by mouth every 4 (four) hours as needed (pain.).     albuterol (VENTOLIN HFA) 108 (90 Base) MCG/ACT inhaler Inhale 2 puffs into the lungs every 6 (six)  hours as needed for wheezing or shortness of breath. 18 g 1   EPINEPHrine (EPIPEN 2-PAK) 0.3 mg/0.3 mL IJ SOAJ injection Inject 0.3 mg into the muscle as needed for anaphylaxis. 0.3 mL 1   hydrochlorothiazide (HYDRODIURIL) 25 MG tablet Take 25 mg by mouth in the morning.     ibuprofen (ADVIL) 200 MG tablet Take 200 mg by mouth every 4 (four) hours as needed (pain.).     montelukast (SINGULAIR) 10 MG tablet Take 1 tablet (10 mg total) by mouth at bedtime. 30 tablet 5   Multiple Vitamins-Minerals (ADULT ONE DAILY GUMMIES PO) Take 2 tablets by mouth in the morning.     Potassium 99 MG TABS Take 99 mg by mouth in the morning. OTC     SYMBICORT 80-4.5 MCG/ACT inhaler 2 puffs 2 times daily for 2 weeks at a time during worsening symptoms or respiratory infections. (Patient taking differently: Inhale 2 puffs into the lungs as directed. 2 puffs 2 times daily for 2 weeks at a time during worsening symptoms or respiratory infections.) 10.2 g 5   No current facility-administered medications for this visit.     Known medication allergies: Allergies  Allergen Reactions   Phenergan [Promethazine] Shortness Of Breath and Palpitations   Shellfish Allergy Anaphylaxis    ALL SHELLFISH   Bee Pollen     Other reaction(s): Cough, Runny nose, Wheezing   Codeine Hives   K-Tab [Potassium Chloride Er]     Yellow Ktab causes hives   Milk-Related  Compounds Itching and Swelling    All dairy products   Other Hives and Swelling    Oranges,  pineapples   Oxycodone Other (See Comments)    Percocet-- "insomnia"   Tramadol Other (See Comments)    Ultram-- " manic episode"   Hydrocodone Hives and Itching    vicodin     Physical examination: Blood pressure (!) 140/68, pulse 81, height 5\' 1"  (1.549 m), weight 239 lb 12.8 oz (108.8 kg), SpO2 97%.  General: Alert, interactive, in no acute distress. HEENT: PERRLA, TMs pearly gray, turbinates minimally edematous without discharge, post-pharynx non  erythematous. Neck: Supple without lymphadenopathy. Lungs: Clear to auscultation without wheezing, rhonchi or rales. {no increased work of breathing. CV: Normal S1, S2 without murmurs. Abdomen: Nondistended, nontender. Skin: Warm and dry, without lesions or rashes. Extremities:  No clubbing, cyanosis or edema. Neuro:   Grossly intact.  Diagnositics/Labs: Labs:  Component     Latest Ref Rng 04/19/2022  IgE (Immunoglobulin E), Serum     6 - 495 IU/mL 159   D Pteronyssinus IgE     Class 0/I kU/L 0.13 !   D Farinae IgE     Class 0 kU/L <0.10   Cat Dander IgE     Class 0 kU/L <0.10   Dog Dander IgE     Class 0/I kU/L 0.15 !   [place] Grass IgE     Class I kU/L 0.32 !   Timothy Grass IgE     Class III kU/L 3.01 !   [place] Grass IgE     Class 0/I kU/L 0.15 !   Cockroach, [place] IgE     Class 0 kU/L <0.10   Penicillium Chrysogen IgE     Class 0 kU/L <0.10   Cladosporium Herbarum IgE     Class 0 kU/L <0.10   Aspergillus Fumigatus IgE     Class 0/I kU/L 0.10 !   Alternaria Alternata IgE     Class 0 kU/L <0.10   Maple/Box Elder IgE     Class I kU/L 0.51 !   Common Silver Charletta Cousin IgE     Class III kU/L 2.25 !   [place], [place] IgE     Class 0 kU/L <0.10   Oak, [place] IgE     Class 0 kU/L <0.10   Elm, American IgE     Class 0/I kU/L 0.12 !   [place] IgE     Class 0 kU/L <0.10   Pecan, [place] IgE     Class IV kU/L 13.20 !   White Mulberry IgE     Class III kU/L 2.75 !   Ragweed, Short IgE     Class II kU/L 0.61 !   Pigweed, Rough IgE     Class 0 kU/L <0.10   Sheep Sorrel IgE Qn     Class 0/I kU/L 0.13 !   Mouse Urine IgE     Class 0 kU/L <0.10   Clam IgE     Class 0 kU/L <0.10   F023-IgE Crab     Class 0 kU/L <0.10   Shrimp IgE     Class 0/I kU/L 0.31 !   Scallop IgE     Class 0 kU/L <0.10   F290-IgE Oyster     Class 0 kU/L <0.10   F080-IgE Lobster     Class 0 kU/L <0.10   Cranberry IgE     Class 0 kU/L <0.10   Allergen Grape IgE     Class 0  kU/L <0.10    Milk IgE     Class I kU/L 0.51 !   Beef IgE     Class 0/I kU/L 0.14 !   Orange     Class 0 kU/L <0.10   Pineapple IgE     Class 0 kU/L <0.10   Tryptase     2.2 - 13.2 ug/L 10.4    Assessment and plan:   Contact allergic reaction - hives, swelling - you are reactive to the chemicals in synthetic hair and should always opt for the ACV soak prior to install and/or use human hair - hives/swelling can be caused by a variety of different triggers including illness/infection, foods, medications, stings, exercise, pressure, vibrations, extremes of temperature to name a few however majority of the time there is no identifiable trigger.   Thus it is possible to have future episodes of hives/swelling for other reasons.  - if hives/swelling return recommend you take Allegra or Zyrtec 1 tab 1-2 times a day with Pepcid 1 tab 1- times a day until symptoms resolve  Moderate persistent asthma without complication - Prior to physical activity: albuterol 2 puffs 10-15 minutes before physical activity. - Rescue medications: albuterol 2 puffs or Symbicort 1-2 puffs every 4-6 hours as needed - Changes during respiratory infections or worsening symptoms: Add on Symbicort 2 puffs twice a day for TWO WEEKS. - If not meeting below goals then take Symbicort 2 puffs twice a day as maintenance therapy.  Symbicort can be used as both maintenance and rescue medication.  Max dosing is 10-12 puffs a day  - Asthma control goals:  * Full participation in all desired activities (may need albuterol before activity) * Albuterol use two time or less a week on average (not counting use with activity) * Cough interfering with sleep two time or less a month * Oral steroids no more than once a year * No hospitalizations  Allergic rhinoconjunctivitis - Continue avoidance measures for  tree, weed, and grass pollens, molds, dog, dust mite  - Continue Zyrtec or Allegra as needed  Anaphylactic shock due to food -  Continue to avoid crab, shrimp, lobster, orange, milk, beef, and, pineapple - IgE to milk and beef are positive.   - Carry EpiPen at all times - you are eligible for in-office food challenge to confirm if not allergic if you become interested   Follow up in 6 months or sooner as needed  I appreciate the opportunity to take part in Quanetta's care. Please do not hesitate to contact me with questions.  Sincerely,   Margo Aye, MD Allergy/Immunology [facility]

## 2022-12-29 ENCOUNTER — Ambulatory Visit
Admission: RE | Admit: 2022-12-29 | Discharge: 2022-12-29 | Disposition: A | Discharge status: Home / Self Care | Payer: PRIVATE HEALTH INSURANCE | Source: Ambulatory Visit | Attending: Nurse Practitioner | Admitting: Nurse Practitioner

## 2022-12-29 DIAGNOSIS — N6320 Unspecified lump in the left breast, unspecified quadrant: Secondary | ICD-10-CM

## 2023-07-10 ENCOUNTER — Other Ambulatory Visit: Payer: Self-pay | Admitting: Allergy

## 2023-07-19 NOTE — Progress Notes (Signed)
 [address] [place] [address] Dept: [phone]  FOLLOW UP NOTE  Patient ID: Victoria George, female    DOB: February 01, 1976  Age: 48 y.o. MRN: [id number] Date of Office Visit: 07/23/2023  Assessment  Chief Complaint: Allergies  HPI Victoria George is a 48 year old female who presents to the clinic for a follow-up visit.  She was last seen in this clinic on 10/18/2022 by Dr. Tempie Fee for evaluation of asthma, allergic rhinitis, allergic conjunctivitis, allergic contact dermatitis, and food allergy.  At that time she was avoiding shellfish, orange, milk, beef, pineapple, crangrape products.  At today's visit, she reports her asthma has been moderately well-controlled with no shortness of breath or wheeze.  She does report cough producing thick yellow to green drainage over the past week.  She continues montelukast  once a day, however, reports that she has increased montelukast  to twice a day over the last several days.  She has not used Symbicort  80 or albuterol  in about 1 year.   Allergic rhinitis is reported as poorly controlled with symptoms including nasal congestion, green to yellow nasal drainage that began about 1 week ago, intermittent headache, and thick postnasal drainage.  She is not currently using any steroid nasal spray, nasal saline rinses, or antihistamine.  She was confused about when to use an antihistamine, to use montelukast .  All questions have been answered at this time.  She reports that she has not had a fever, sweats, chills, or exposure to sick contacts.  She reports that until about 1-1 and half weeks ago her allergies have been more well-controlled.  Her last environmental allergy skin testing was on 02/28/2017 and was positive to grass pollen, weed pollen, ragweed pollen, tree pollen, mold, cockroach, dust mite, cat, and dog.  Allergic conjunctivitis is reported as moderately well-controlled with occasional red and itchy eyes for which she is using olopatadine with  only mild relief of symptoms.  She continues to avoid shellfish, orange, milk, beef, pineapple, and Cran grape with no accidental ingestion or EpiPen  use since her last visit to this clinic.  EpiPen  set will be reordered at today's visit.  She continues to avoid the hair that caused irritation previously.  Her current medications are listed in the chart.  Drug Allergies:  Allergies  Allergen Reactions   Phenergan  [Promethazine ] Shortness Of Breath and Palpitations   Shellfish Allergy Anaphylaxis    ALL SHELLFISH   Bee Pollen     Other reaction(s): Cough, Runny nose, Wheezing   Codeine Hives   K-Tab  [Potassium Chloride  Er]     Yellow Ktab causes hives   Milk-Related Compounds Itching and Swelling    All dairy products   Other Hives and Swelling    Oranges,  pineapples   Oxycodone  Other (See Comments)    Percocet-- "insomnia"   Tramadol  Other (See Comments)    Ultram -- " manic episode"   Hydrocodone Hives and Itching    vicodin    Physical Exam: BP 128/86 (BP Location: Left Arm, Patient Position: Sitting, Cuff Size: Normal)   Pulse 80   Temp 98.3 F (36.8 C) (Temporal)   Resp 16   SpO2 98%    Physical Exam Vitals reviewed.  Constitutional:      Appearance: Normal appearance.  HENT:     Head: Normocephalic and atraumatic.     Right Ear: Tympanic membrane normal.     Left Ear: Tympanic membrane normal.     Nose:     Comments: Bilateral nares edematous and  pale with thick yellow nasal drainage noted.  Pharynx is slightly erythematous with no exudate.  Ears normal.  Eyes normal. Eyes:     Conjunctiva/sclera: Conjunctivae normal.  Cardiovascular:     Rate and Rhythm: Normal rate and regular rhythm.     Heart sounds: Normal heart sounds. No murmur heard. Pulmonary:     Effort: Pulmonary effort is normal.     Breath sounds: Normal breath sounds.     Comments: Lungs clear to auscultation Musculoskeletal:        General: Normal range of motion.     Cervical back:  Normal range of motion and neck supple.  Skin:    General: Skin is warm and dry.  Neurological:     Mental Status: She is alert and oriented to person, place, and time.  Psychiatric:        Mood and Affect: Mood normal.        Behavior: Behavior normal.        Thought Content: Thought content normal.        Judgment: Judgment normal.     Diagnostics: FVC 1.48 which is 55% of predicted value, FEV1 1.18 which is 54% of predicted value.  Spirometry indicates possible restriction.  Assessment and Plan: 1. Acute bacterial sinusitis   2. Mild intermittent asthma, uncomplicated   3. Seasonal and perennial allergic rhinitis   4. Seasonal allergic conjunctivitis   5. Allergic dermatitis due to other chemical product   6. Food allergy     Meds ordered this encounter  Medications   amoxicillin-clavulanate (AUGMENTIN) 875-125 MG tablet    Sig: Take 1 tablet by mouth 2 (two) times daily.    Dispense:  20 tablet    Refill:  0   cetirizine  (ZYRTEC ) 10 MG tablet    Sig: Take 1 tablet (10 mg total) by mouth daily.    Dispense:  30 tablet    Refill:  5   cromolyn (OPTICROM) 4 % ophthalmic solution    Sig: Place 2 drops into both eyes 4 (four) times daily as needed.    Dispense:  10 mL    Refill:  5   EPINEPHrine  (EPIPEN  2-PAK) 0.3 mg/0.3 mL IJ SOAJ injection    Sig: Inject 0.3 mg into the muscle as needed for anaphylaxis.    Dispense:  0.3 mL    Refill:  1    Patient Instructions  Acute bacterial sinusitis  Begin Augmentin twice a day for 10 days Consider nasal saline rinses Consider Flonase 2 sprays in each nostril once a day Begin Mucinex [phone] mg twice a day and increase fluid intake to thin out mucus  Asthma Continue montelukast  10 mg once a day to prevent cough or wheeze Resstart Symbicort  80-2 puffs twice a day with a spacer to prevent cough or wheeze Continue albuterol  2 puffs once every 4 hours if needed for cough or wheeze  Allergic rhinitis Continue allergen  avoidance measures directed toward grass pollen, tree pollen, mold, cockroach, dust mite, ragweed, weed pollen, cat, and dog as listed below Begin cetirizine  10 mg once a day if needed for a runny nose or itch Consider saline nasal rinses as needed for nasal symptoms. Use this before any medicated nasal sprays for best result Consider updating your allergy testing.  Remember to stop antihistamines for 3 days before your allergy testing appointment  Allergic conjunctivitis Begin cromolyn 1 to 2 drops in each eye up to 4 times a day if needed for red or itchy eyes  Avoid eye drops that say red eye relief as they may contain medications that dry out your eyes.   Allergic contact dermatitis Continue to avid hair products that have caused symptoms previously  Food allergy Continue to avoid shellfish, orange, milk, beef, pineapple, and Cran grape products.  In case of an allergic reaction, take Benadryl  50 mg every 4 hours, and if life-threatening symptoms occur, inject with EpiPen  0.3 mg.  Call the clinic if this treatment plan is not working well for you.  Follow up in 2 months or sooner if needed.  Return in about 2 months (around 09/22/2023).    Thank you for the opportunity to care for this patient.  Please do not hesitate to contact me with questions.  Marinus Sic, FNP [facility]  

## 2023-07-20 NOTE — Patient Instructions (Addendum)
 Acute bacterial sinusitis  Begin Augmentin  twice a day for 10 days Consider nasal saline rinses Consider Flonase 2 sprays in each nostril once a day Begin Mucinex [phone] mg twice a day and increase fluid intake to thin out mucus  Asthma Continue montelukast  10 mg once a day to prevent cough or wheeze Resstart Symbicort  80-2 puffs twice a day with a spacer to prevent cough or wheeze Continue albuterol  2 puffs once every 4 hours if needed for cough or wheeze  Allergic rhinitis Continue allergen avoidance measures directed toward grass pollen, tree pollen, mold, cockroach, dust mite, ragweed, weed pollen, cat, and dog as listed below Begin cetirizine  10 mg once a day if needed for a runny nose or itch Consider saline nasal rinses as needed for nasal symptoms. Use this before any medicated nasal sprays for best result Consider updating your allergy testing.  Remember to stop antihistamines for 3 days before your allergy testing appointment  Allergic conjunctivitis Begin cromolyn  1 to 2 drops in each eye up to 4 times a day if needed for red or itchy eyes Avoid eye drops that say red eye relief as they may contain medications that dry out your eyes.   Allergic contact dermatitis Continue to avid hair products that have caused symptoms previously  Food allergy Continue to avoid shellfish, orange, milk, beef, pineapple, and Cran grape products.  In case of an allergic reaction, take Benadryl  50 mg every 4 hours, and if life-threatening symptoms occur, inject with EpiPen  0.3 mg.  Call the clinic if this treatment plan is not working well for you.  Follow up in 2 months or sooner if needed.  Reducing Pollen Exposure The American Academy of Allergy, Asthma and Immunology suggests the following steps to reduce your exposure to pollen during allergy seasons. Do not hang sheets or clothing out to dry; pollen may collect on these items. Do not mow lawns or spend time around freshly cut grass;  mowing stirs up pollen. Keep windows closed at night.  Keep car windows closed while driving. Minimize morning activities outdoors, a time when pollen counts are usually at their highest. Stay indoors as much as possible when pollen counts or humidity is high and on windy days when pollen tends to remain in the air longer. Use air conditioning when possible.  Many air conditioners have filters that trap the pollen spores. Use a HEPA room air filter to remove pollen form the indoor air you breathe.  Control of Mold Allergen Mold and fungi can grow on a variety of surfaces provided certain temperature and moisture conditions exist.  Outdoor molds grow on plants, decaying vegetation and soil.  The major outdoor mold, Alternaria and Cladosporium, are found in very high numbers during hot and dry conditions.  Generally, a late Summer - Fall peak is seen for common outdoor fungal spores.  Rain will temporarily lower outdoor mold spore count, but counts rise rapidly when the rainy period ends.  The most important indoor molds are Aspergillus and Penicillium.  Dark, humid and poorly ventilated basements are ideal sites for mold growth.  The next most common sites of mold growth are the bathroom and the kitchen.  Outdoor [employer] Use air conditioning and keep windows closed Avoid exposure to decaying vegetation. Avoid leaf raking. Avoid grain handling. Consider wearing a face mask if working in moldy areas.  Indoor Mold Control Maintain humidity below 50%. Clean washable surfaces with 5% bleach solution. Remove sources e.g. Contaminated carpets.  Control of Dog  or Cat Allergen Avoidance is the best way to manage a dog or cat allergy. If you have a dog or cat and are allergic to dog or cats, consider removing the dog or cat from the home. If you have a dog or cat but don't want to find it a new home, or if your family wants a pet even though someone in the household is allergic, here are some  strategies that may help keep symptoms at bay:  Keep the pet out of your bedroom and restrict it to only a few rooms. Be advised that keeping the dog or cat in only one room will not limit the allergens to that room. Don't pet, hug or kiss the dog or cat; if you do, wash your hands with soap and water . High-efficiency particulate air (HEPA) cleaners run continuously in a bedroom or living room can reduce allergen levels over time. Regular use of a high-efficiency vacuum cleaner or a central vacuum can reduce allergen levels. Giving your dog or cat a bath at least once a week can reduce airborne allergen.   Control of Dust Mite Allergen Dust mites play a major role in allergic asthma and rhinitis. They occur in environments with high humidity wherever human skin is found. Dust mites absorb humidity from the atmosphere (ie, they do not drink) and feed on organic matter (including shed human and animal skin). Dust mites are a microscopic type of insect that you cannot see with the naked eye. High levels of dust mites have been detected from mattresses, pillows, carpets, upholstered furniture, bed covers, clothes, soft toys and any woven material. The principal allergen of the dust mite is found in its feces. A gram of dust may contain 1,000 mites and 250,000 fecal particles. Mite antigen is easily measured in the air during house cleaning activities. Dust mites do not bite and do not cause harm to humans, other than by triggering allergies/asthma.  Ways to decrease your exposure to dust mites in your home:  1. Encase mattresses, box springs and pillows with a mite-impermeable barrier or cover  2. Wash sheets, blankets and drapes weekly in hot water  (130 F) with detergent and dry them in a dryer on the hot setting.  3. Have the room cleaned frequently with a vacuum cleaner and a damp dust-mop. For carpeting or rugs, vacuuming with a vacuum cleaner equipped with a high-efficiency particulate air (HEPA)  filter. The dust mite allergic individual should not be in a room which is being cleaned and should wait 1 hour after cleaning before going into the room.  4. Do not sleep on upholstered furniture (eg, couches).  5. If possible removing carpeting, upholstered furniture and drapery from the home is ideal. Horizontal blinds should be eliminated in the rooms where the person spends the most time (bedroom, study, television room). Washable vinyl, roller-type shades are optimal.  6. Remove all non-washable stuffed toys from the bedroom. Wash stuffed toys weekly like sheets and blankets above.  7. Reduce indoor humidity to less than 50%. Inexpensive humidity monitors can be purchased at most hardware stores. Do not use a humidifier as can make the problem worse and are not recommended.  Control of Cockroach Allergen Cockroach allergen has been identified as an important cause of acute attacks of asthma, especially in urban settings.  There are fifty-five species of cockroach that exist in the [place] , however only three, the [place], [place] and [place] species produce allergen that can affect patients with Asthma.  Allergens  can be obtained from fecal particles, egg casings and secretions from cockroaches.    Remove food sources. Reduce access to water . Seal access and entry points. Spray runways with 0.5-1% Diazinon or Chlorpyrifos Blow boric acid power under stoves and refrigerator. Place bait stations (hydramethylnon) at feeding sites.

## 2023-07-23 ENCOUNTER — Other Ambulatory Visit: Payer: Self-pay

## 2023-07-23 ENCOUNTER — Ambulatory Visit (INDEPENDENT_AMBULATORY_CARE_PROVIDER_SITE_OTHER): Payer: PRIVATE HEALTH INSURANCE | Admitting: Family Medicine

## 2023-07-23 ENCOUNTER — Encounter: Payer: Self-pay | Admitting: Family Medicine

## 2023-07-23 VITALS — BP 128/86 | HR 80 | Temp 98.3°F | Resp 16

## 2023-07-23 DIAGNOSIS — J019 Acute sinusitis, unspecified: Secondary | ICD-10-CM | POA: Diagnosis not present

## 2023-07-23 DIAGNOSIS — Z91018 Allergy to other foods: Secondary | ICD-10-CM

## 2023-07-23 DIAGNOSIS — H1010 Acute atopic conjunctivitis, unspecified eye: Secondary | ICD-10-CM

## 2023-07-23 DIAGNOSIS — H1013 Acute atopic conjunctivitis, bilateral: Secondary | ICD-10-CM

## 2023-07-23 DIAGNOSIS — J302 Other seasonal allergic rhinitis: Secondary | ICD-10-CM

## 2023-07-23 DIAGNOSIS — J4520 Mild intermittent asthma, uncomplicated: Secondary | ICD-10-CM | POA: Diagnosis not present

## 2023-07-23 DIAGNOSIS — J3089 Other allergic rhinitis: Secondary | ICD-10-CM

## 2023-07-23 DIAGNOSIS — B9689 Other specified bacterial agents as the cause of diseases classified elsewhere: Secondary | ICD-10-CM

## 2023-07-23 DIAGNOSIS — L235 Allergic contact dermatitis due to other chemical products: Secondary | ICD-10-CM

## 2023-07-23 DIAGNOSIS — L259 Unspecified contact dermatitis, unspecified cause: Secondary | ICD-10-CM | POA: Insufficient documentation

## 2023-07-23 MED ORDER — EPINEPHRINE 0.3 MG/0.3ML IJ SOAJ: 0.3000 mg | INTRAMUSCULAR | 1 refills | Status: AC | PRN

## 2023-07-23 MED ORDER — CETIRIZINE HCL 10 MG PO TABS: 10.0000 mg | ORAL_TABLET | Freq: Every day | ORAL | 5 refills | Status: DC

## 2023-07-23 MED ORDER — CROMOLYN SODIUM 4 % OP SOLN: 2.0000 [drp] | Freq: Four times a day (QID) | OPHTHALMIC | 5 refills | Status: AC | PRN

## 2023-07-23 MED ORDER — AMOXICILLIN-POT CLAVULANATE 875-125 MG PO TABS: 1.0000 | ORAL_TABLET | Freq: Two times a day (BID) | ORAL | 0 refills | Status: DC

## 2023-07-24 NOTE — Addendum Note (Signed)
 Addended by: Addalyne Vandehei M on: 07/24/2023 11:40 AM   Modules accepted: Orders, Level of Service

## 2023-08-01 NOTE — Addendum Note (Signed)
 Addended by: Norville Beery, Jannely Henthorn on: 08/01/2023 05:50 PM   Modules accepted: Orders

## 2023-08-14 ENCOUNTER — Other Ambulatory Visit: Payer: Self-pay

## 2023-08-14 MED ORDER — MONTELUKAST SODIUM 10 MG PO TABS: ORAL_TABLET | ORAL | 1 refills | Status: AC

## 2023-10-11 ENCOUNTER — Encounter (HOSPITAL_BASED_OUTPATIENT_CLINIC_OR_DEPARTMENT_OTHER): Payer: Self-pay | Admitting: Orthopaedic Surgery

## 2023-10-11 ENCOUNTER — Other Ambulatory Visit: Payer: Self-pay

## 2023-10-12 ENCOUNTER — Encounter (HOSPITAL_BASED_OUTPATIENT_CLINIC_OR_DEPARTMENT_OTHER)
Admission: RE | Admit: 2023-10-12 | Discharge: 2023-10-12 | Disposition: A | Discharge status: Home / Self Care | Payer: PRIVATE HEALTH INSURANCE | Source: Ambulatory Visit | Attending: Orthopaedic Surgery | Admitting: Orthopaedic Surgery

## 2023-10-12 DIAGNOSIS — I10 Essential (primary) hypertension: Secondary | ICD-10-CM | POA: Insufficient documentation

## 2023-10-12 DIAGNOSIS — Z01818 Encounter for other preprocedural examination: Secondary | ICD-10-CM | POA: Diagnosis present

## 2023-10-12 LAB — BASIC METABOLIC PANEL WITH GFR
Anion gap: 9 (ref 5–15)
BUN: 11 mg/dL (ref 6–20)
CO2: 27 mmol/L (ref 22–32)
Calcium: 9.5 mg/dL (ref 8.9–10.3)
Chloride: 102 mmol/L (ref 98–111)
Creatinine, Ser: 1.12 mg/dL — ABNORMAL HIGH (ref 0.44–1.00)
GFR, Estimated: 60 mL/min — ABNORMAL LOW (ref >60)
Glucose, Bld: 86 mg/dL (ref 70–99)
Potassium: 3.4 mmol/L — ABNORMAL LOW (ref 3.5–5.1)
Sodium: 138 mmol/L (ref 135–145)

## 2023-10-15 NOTE — H&P (Signed)
 ORTHOPAEDIC SURGERY H&P  Subjective:  The patient presents for left ankle removal of hardware.   Past Medical History:  Diagnosis Date   Allergic urticaria    H/O hypokalemia 03/2021   ED visit due to syncope w/ LOC ,  potassium level 2.7 felt due to taking HCTZ   History of ankle surgery    Left ankle, removing titanium skrew 2024   History of gastritis    12/ 2012   History of iron deficiency anemia    History of ovarian cyst    Hypertension    Moderate persistent asthma, uncomplicated    [facility] jeneal   Non-seasonal allergic rhinitis due to other allergic trigger    Seasonal and perennial allergic rhinitis    Submucous leiomyoma of uterus    Wears glasses     Past Surgical History:  Procedure Laterality Date   ALLOGRAFT APPLICATION Left 12/28/2021   Procedure: ALLOGRAFT APPLICATION;  Surgeon: Barton Drape, MD;  Location: [facility] OR;  Service: Orthopedics;  Laterality: Left;   CESAREAN SECTION N/A 03/02/2019   Procedure: CESAREAN SECTION;  Surgeon: Delana Ted Morrison, DO;  Location: [facility] ORS;  Service: Obstetrics;  Laterality: N/A;   CHOLECYSTECTOMY  02/01/2011   Procedure: LAPAROSCOPIC CHOLECYSTECTOMY WITH INTRAOPERATIVE CHOLANGIOGRAM;  Surgeon: Krystal JINNY Russell, MD;  Location: [facility] ORS;  Service: General;  Laterality: N/A;  Needs IOC and C-arm   DIAGNOSTIC LAPAROSCOPY  07/2007   ?Lysis Adhesions for obstructive right fallopian tube   ESOPHAGOGASTRODUODENOSCOPY  01/30/2011   Procedure: ESOPHAGOGASTRODUODENOSCOPY (EGD);  Surgeon: Gordy Starch, MD;  Location: [facility];  Service: Gastroenterology;  Laterality: N/A;   ESOPHAGOGASTRODUODENOSCOPY  03/03/2011   Procedure: ESOPHAGOGASTRODUODENOSCOPY (EGD);  Surgeon: Gordy Starch, MD;  Location: [facility];  Service: Gastroenterology;  Laterality: N/A;   HARDWARE REMOVAL Left 07/17/2022   Procedure: left ankle elective removal of hardware (syndesmotic screw);  Surgeon: Barton Drape, MD;  Location:  [facility];  Service: Orthopedics;  Laterality: Left;   HYSTEROSCOPY N/A 03/22/2016   Procedure: HYSTEROSCOPY, POLYPECTOMY;  Surgeon: Cynthia Loss, MD;  Location: [facility] ;  Service: Gynecology;  Laterality: N/A;   HYSTEROSCOPY WITH RESECTOSCOPE N/A 09/19/2017   Procedure: HYSTEROSCOPY MYOMECTOMY;  Surgeon: Yalcinkaya, Tamer, MD;  Location: [facility];  Service: Gynecology;  Laterality: N/A;   LAPAROSCOPIC GELPORT ASSISTED MYOMECTOMY N/A 03/22/2016   Procedure: LAPAROSCOPIC GELPORT ASSISTED MYOMECTOMY, LYSIS OF ADHESIONS, CHROMOPERTUBATION;  Surgeon: Cynthia Loss, MD;  Location: [facility];  Service: Gynecology;  Laterality: N/A;   ORIF ANKLE FRACTURE Left 12/28/2021   Procedure: OPEN REDUCTION INTERNAL FIXATION (ORIF) left trimalleolar ANKLE FRACTURE syndesmosis fixation;  Surgeon: Barton Drape, MD;  Location: [facility] OR;  Service: Orthopedics;  Laterality: Left;  120 min   SYNDESMOSIS REPAIR Left 12/28/2021   Procedure: SYNDESMOSIS REPAIR;  Surgeon: Barton Drape, MD;  Location: [facility] OR;  Service: Orthopedics;  Laterality: Left;     (Not in an outpatient encounter)    Allergies  Allergen Reactions   Phenergan  [Promethazine ] Shortness Of Breath and Palpitations   Shellfish Allergy Anaphylaxis    ALL SHELLFISH   Bee Pollen     Other reaction(s): Cough, Runny nose, Wheezing   Codeine Hives   K-Tab  [Potassium Chloride  Er]     Yellow Ktab causes hives   Milk-Related Compounds Itching and Swelling    All dairy products   Other Hives and Swelling    Oranges,  pineapples   Oxycodone  Other (See Comments)    Percocet-- insomnia  Tramadol  Other (See Comments)    Ultram --  manic episode   Hydrocodone Hives and Itching    vicodin    Social History   Socioeconomic History   Marital status: Married    Spouse name: Not on file   Number of children: 0   Years of education: Not on file   Highest education  level: Not on file  Occupational History   Occupation: counselor    Employer: [employer]  Tobacco Use   Smoking status: Never    Passive exposure: Never   Smokeless tobacco: Never  Vaping Use   Vaping status: Never Used  Substance and Sexual Activity   Alcohol use: Not Currently   Drug use: Never   Sexual activity: Yes    Partners: Male    Birth control/protection: None    Comment: perimenopausal  Other Topics Concern   Not on file  Social History Narrative   Not on file   Social Drivers of Health   Financial Resource Strain: Not on file  Food Insecurity: Not on file  Transportation Needs: Not on file  Physical Activity: Not on file  Stress: Not on file  Social Connections: Unknown (08/07/2021)   [facility]   Social Network    Social Network: Not on file  Intimate Partner Violence: Unknown (06/29/2021)   Received from [facility]   HITS    Physically Hurt: Not on file    Insult or Talk Down To: Not on file    Threaten Physical Harm: Not on file    Scream or Curse: Not on file     History reviewed. No pertinent family history.   Review of Systems Pertinent items are noted in HPI.  Objective: Vital signs in last 24 hours:    10/11/2023    9:36 AM 10/10/2023    2:57 PM 07/23/2023   10:26 AM  Vitals with BMI  Height 5' 1 5' 1   Weight 198 lbs 240 lbs   BMI 37.43 45.37   Systolic   128  Diastolic   86  Pulse   80      EXAM: General: Well nourished, well developed. Awake, alert and oriented to time, place, person. Normal mood and affect. No apparent distress. Breathing room air.  Operative Lower Extremity: Alignment - Neutral Deformity - None Skin intact Tenderness to palpation - left medial ankle 5/5 TA, PT, GS, Per, EHL, FHL Sensation intact to light touch throughout Palpable DP and PT pulses Special testing: None  The contralateral foot/ankle was examined for comparison and noted to be neurovascularly intact with no  localized deformity, swelling, or tenderness.  Imaging Review All images taken were independently reviewed by me.  Assessment/Plan: The clinical and radiographic findings were reviewed and discussed at length with the patient.  The patient presents for left ankle removal of hardware.  We spoke at length about the natural course of these findings. We discussed nonoperative and operative treatment options in detail.  The risks and benefits were presented and reviewed. The risks due to suture/hardware failure/irritation (or if removing hardware inability to remove part/all of hardware, recurrent instability), new/persistent/recurrent infection, stiffness, nerve/vessel/tendon injury, nonunion/malunion of any fracture, wound healing issues, allograft usage, development of arthritis, failure of this surgery, possibility of external fixation in certain situations, possibility of delayed definitive surgery, need for further surgery, prolonged wound care including further soft tissue coverage procedures, thromboembolic events, anesthesia/medical complications/events perioperatively and beyond, amputation, death among others were discussed. The patient acknowledged the  explanation and agreed to proceed with the plan.  Victoria George  Orthopaedic Surgery [facility]

## 2023-10-15 NOTE — Discharge Instructions (Signed)
 Netta Cedars, MD [facility]  Please read the following information regarding your care after surgery.  Medications  You only need a prescription for the narcotic pain medicine (ex. oxycodone, Percocet, Norco).  All of the other medicines listed below are available over the counter. ? Aleve 2 pills twice a day for the first 3 days after surgery. ? acetominophen (Tylenol) 650 mg every 4-6 hours as you need for minor to moderate pain ? oxycodone as prescribed for severe pain  ? To help prevent blood clots, take aspirin (81 mg) twice daily for 28 days after surgery.  You should also get up every hour while you are awake to move around.  Weight Bearing ? OK to walk on the operative leg only AFTER the nerve block has completely worn off.  Cast / Splint / Dressing ? Keep your dressing clean and dry.  Don't put anything (coat hanger, pencil, etc) down inside of it.  If it gets wet, please notify the office immediately.  Swelling IMPORTANT: It is normal for you to have swelling where you had surgery. To reduce swelling and pain, keep at least 3 pillows under your leg so that your toes are above your nose and your heel is above the level of your hip.  It may be necessary to keep your foot or leg elevated for several weeks.  This is critical to helping your incisions heal and your pain to feel better.  Follow Up Call my office at [phone] when you are discharged from the hospital or surgery center to schedule an appointment to be seen within 7-10 days after surgery.  Call my office at [phone] if you develop a fever >101.5 F, nausea, vomiting, bleeding from the surgical site or severe pain.   Post Anesthesia Home Care Instructions  Activity: Get plenty of rest for the remainder of the day. A responsible individual must stay with you for 24 hours following the procedure.  For the next 24 hours, DO NOT: -Drive a car -Advertising copywriter -Drink alcoholic beverages -Take any  medication unless instructed by your physician -Make any legal decisions or sign important papers.  Meals: Start with liquid foods such as gelatin or soup. Progress to regular foods as tolerated. Avoid greasy, spicy, heavy foods. If nausea and/or vomiting occur, drink only clear liquids until the nausea and/or vomiting subsides. Call your physician if vomiting continues.  Special Instructions/Symptoms: Your throat may feel dry or sore from the anesthesia or the breathing tube placed in your throat during surgery. If this causes discomfort, gargle with warm salt water. The discomfort should disappear within 24 hours.  If you had a scopolamine patch placed behind your ear for the management of post- operative nausea and/or vomiting:  1. The medication in the patch is effective for 72 hours, after which it should be removed.  Wrap patch in a tissue and discard in the trash. Wash hands thoroughly with soap and water. 2. You may remove the patch earlier than 72 hours if you experience unpleasant side effects which may include dry mouth, dizziness or visual disturbances. 3. Avoid touching the patch. Wash your hands with soap and water after contact with the patch.    Post Anesthesia Home Care Instructions  Activity: Get plenty of rest for the remainder of the day. A responsible individual must stay with you for 24 hours following the procedure.  For the next 24 hours, DO NOT: -Drive a car -Advertising copywriter -Drink alcoholic beverages -Take any medication unless instructed by your

## 2023-10-17 ENCOUNTER — Ambulatory Visit (HOSPITAL_BASED_OUTPATIENT_CLINIC_OR_DEPARTMENT_OTHER): Payer: PRIVATE HEALTH INSURANCE | Admitting: Anesthesiology

## 2023-10-17 ENCOUNTER — Encounter (HOSPITAL_BASED_OUTPATIENT_CLINIC_OR_DEPARTMENT_OTHER): Payer: Self-pay | Admitting: Orthopaedic Surgery

## 2023-10-17 ENCOUNTER — Other Ambulatory Visit: Payer: Self-pay

## 2023-10-17 ENCOUNTER — Encounter (HOSPITAL_BASED_OUTPATIENT_CLINIC_OR_DEPARTMENT_OTHER)
Admission: RE | Disposition: A | Discharge status: Home / Self Care | Payer: Self-pay | Source: Home / Self Care | Attending: Orthopaedic Surgery

## 2023-10-17 ENCOUNTER — Ambulatory Visit (HOSPITAL_BASED_OUTPATIENT_CLINIC_OR_DEPARTMENT_OTHER)
Admission: RE | Admit: 2023-10-17 | Discharge: 2023-10-17 | Disposition: A | Discharge status: Home / Self Care | Payer: PRIVATE HEALTH INSURANCE | Attending: Orthopaedic Surgery | Admitting: Orthopaedic Surgery

## 2023-10-17 ENCOUNTER — Ambulatory Visit (HOSPITAL_BASED_OUTPATIENT_CLINIC_OR_DEPARTMENT_OTHER): Payer: PRIVATE HEALTH INSURANCE

## 2023-10-17 DIAGNOSIS — Z472 Encounter for removal of internal fixation device: Secondary | ICD-10-CM | POA: Diagnosis not present

## 2023-10-17 DIAGNOSIS — I10 Essential (primary) hypertension: Secondary | ICD-10-CM | POA: Diagnosis not present

## 2023-10-17 DIAGNOSIS — J4540 Moderate persistent asthma, uncomplicated: Secondary | ICD-10-CM | POA: Insufficient documentation

## 2023-10-17 DIAGNOSIS — S82842D Displaced bimalleolar fracture of left lower leg, subsequent encounter for closed fracture with routine healing: Secondary | ICD-10-CM | POA: Diagnosis not present

## 2023-10-17 DIAGNOSIS — X58XXXD Exposure to other specified factors, subsequent encounter: Secondary | ICD-10-CM | POA: Diagnosis not present

## 2023-10-17 HISTORY — PX: HARDWARE REMOVAL: SHX979

## 2023-10-17 HISTORY — PX: FLEXOR TENDON REPAIR: SHX6501

## 2023-10-17 SURGERY — REMOVAL, HARDWARE
Anesthesia: General | Site: Ankle | Laterality: Left

## 2023-10-17 MED ORDER — HYDROMORPHONE HCL 1 MG/ML IJ SOLN
INTRAMUSCULAR | Status: AC
  Filled 2023-10-17: qty 0.5

## 2023-10-17 MED ORDER — ONDANSETRON HCL 4 MG/2ML IJ SOLN
INTRAMUSCULAR | Status: AC
  Filled 2023-10-17: qty 2

## 2023-10-17 MED ORDER — MEPERIDINE HCL 25 MG/ML IJ SOLN: 6.2500 mg | INTRAMUSCULAR | Status: DC | PRN

## 2023-10-17 MED ORDER — VANCOMYCIN HCL 500 MG IV SOLR
INTRAVENOUS | Status: AC
  Filled 2023-10-17: qty 30

## 2023-10-17 MED ORDER — LIDOCAINE 2% (20 MG/ML) 5 ML SYRINGE
INTRAMUSCULAR | Status: AC
  Filled 2023-10-17: qty 5

## 2023-10-17 MED ORDER — MIDAZOLAM HCL 2 MG/2ML IJ SOLN
INTRAMUSCULAR | Status: AC
  Filled 2023-10-17: qty 2

## 2023-10-17 MED ORDER — FENTANYL CITRATE (PF) 100 MCG/2ML IJ SOLN
INTRAMUSCULAR | Status: DC | PRN
  Administered 2023-10-17: 50 ug via INTRAVENOUS

## 2023-10-17 MED ORDER — FENTANYL CITRATE (PF) 100 MCG/2ML IJ SOLN
INTRAMUSCULAR | Status: AC
Start: 2023-10-17 — End: 2023-10-17
  Filled 2023-10-17: qty 2

## 2023-10-17 MED ORDER — LACTATED RINGERS IV SOLN: INTRAVENOUS | Status: DC

## 2023-10-17 MED ORDER — ACETAMINOPHEN 10 MG/ML IV SOLN
INTRAVENOUS | Status: AC
  Filled 2023-10-17: qty 100

## 2023-10-17 MED ORDER — 0.9 % SODIUM CHLORIDE (POUR BTL) OPTIME
TOPICAL | Status: DC | PRN
  Administered 2023-10-17: 200 mL

## 2023-10-17 MED ORDER — CHLORHEXIDINE GLUCONATE 4 % EX SOLN: 60.0000 mL | Freq: Once | CUTANEOUS | Status: DC

## 2023-10-17 MED ORDER — BUPIVACAINE HCL (PF) 0.25 % IJ SOLN
INTRAMUSCULAR | Status: AC
  Filled 2023-10-17: qty 60

## 2023-10-17 MED ORDER — FENTANYL CITRATE (PF) 100 MCG/2ML IJ SOLN
INTRAMUSCULAR | Status: AC
  Filled 2023-10-17: qty 2

## 2023-10-17 MED ORDER — PROPOFOL 10 MG/ML IV BOLUS
INTRAVENOUS | Status: DC | PRN
  Administered 2023-10-17: 180 mg via INTRAVENOUS

## 2023-10-17 MED ORDER — MIDAZOLAM HCL 2 MG/2ML IJ SOLN
2.0000 mg | Freq: Once | INTRAMUSCULAR | Status: AC
Start: 2023-10-17 — End: 2023-10-17
  Administered 2023-10-17: 2 mg via INTRAVENOUS

## 2023-10-17 MED ORDER — DEXAMETHASONE SODIUM PHOSPHATE 10 MG/ML IJ SOLN
INTRAMUSCULAR | Status: AC
  Filled 2023-10-17: qty 1

## 2023-10-17 MED ORDER — BUPIVACAINE-EPINEPHRINE (PF) 0.5% -1:200000 IJ SOLN
INTRAMUSCULAR | Status: AC
  Filled 2023-10-17: qty 60

## 2023-10-17 MED ORDER — ACETAMINOPHEN 10 MG/ML IV SOLN
1000.0000 mg | Freq: Once | INTRAVENOUS | Status: AC
  Administered 2023-10-17: 1000 mg via INTRAVENOUS

## 2023-10-17 MED ORDER — CEFAZOLIN SODIUM-DEXTROSE 2-4 GM/100ML-% IV SOLN
2.0000 g | INTRAVENOUS | Status: AC
  Administered 2023-10-17: 2 g via INTRAVENOUS

## 2023-10-17 MED ORDER — ONDANSETRON HCL 4 MG/2ML IJ SOLN
INTRAMUSCULAR | Status: DC | PRN
  Administered 2023-10-17: 4 mg via INTRAVENOUS

## 2023-10-17 MED ORDER — DEXAMETHASONE SODIUM PHOSPHATE 10 MG/ML IJ SOLN
INTRAMUSCULAR | Status: DC | PRN
  Administered 2023-10-17: 10 mg via INTRAVENOUS

## 2023-10-17 MED ORDER — PROPOFOL 10 MG/ML IV BOLUS
INTRAVENOUS | Status: AC
  Filled 2023-10-17: qty 20

## 2023-10-17 MED ORDER — HYDROMORPHONE HCL 1 MG/ML IJ SOLN
0.2500 mg | INTRAMUSCULAR | Status: DC | PRN
  Administered 2023-10-17 (×4): 0.5 mg via INTRAVENOUS

## 2023-10-17 MED ORDER — AMISULPRIDE (ANTIEMETIC) 5 MG/2ML IV SOLN: 10.0000 mg | Freq: Once | INTRAVENOUS | Status: DC | PRN

## 2023-10-17 MED ORDER — ROPIVACAINE HCL 5 MG/ML IJ SOLN
INTRAMUSCULAR | Status: DC | PRN
  Administered 2023-10-17: 30 mL via PERINEURAL

## 2023-10-17 MED ORDER — CEFAZOLIN SODIUM-DEXTROSE 2-4 GM/100ML-% IV SOLN
INTRAVENOUS | Status: AC
  Filled 2023-10-17: qty 100

## 2023-10-17 MED ORDER — POVIDONE-IODINE 10 % EX SOLN
CUTANEOUS | Status: DC | PRN
  Administered 2023-10-17: 1 via TOPICAL

## 2023-10-17 MED ORDER — LIDOCAINE 2% (20 MG/ML) 5 ML SYRINGE
INTRAMUSCULAR | Status: DC | PRN
  Administered 2023-10-17: 20 mg via INTRAVENOUS

## 2023-10-17 MED ORDER — VANCOMYCIN HCL 500 MG IV SOLR
INTRAVENOUS | Status: DC | PRN
  Administered 2023-10-17: 500 mg via TOPICAL

## 2023-10-17 MED ORDER — FENTANYL CITRATE (PF) 100 MCG/2ML IJ SOLN
100.0000 ug | Freq: Once | INTRAMUSCULAR | Status: AC
  Administered 2023-10-17: 100 ug via INTRAVENOUS

## 2023-10-17 SURGICAL SUPPLY — 50 items
BLADE SURG 15 STRL LF DISP TIS (BLADE) ×4 IMPLANT
BNDG COHESIVE 4X5 TAN STRL LF (GAUZE/BANDAGES/DRESSINGS) ×1 IMPLANT
BNDG ELASTIC 4INX 5YD STR LF (GAUZE/BANDAGES/DRESSINGS) ×1 IMPLANT
BNDG ELASTIC 6INX 5YD STR LF (GAUZE/BANDAGES/DRESSINGS) IMPLANT
BNDG GAUZE DERMACEA FLUFF 4 (GAUZE/BANDAGES/DRESSINGS) ×1 IMPLANT
BRUSH SCRUB EZ 4% CHG (MISCELLANEOUS) ×1 IMPLANT
CANISTER SUCT 1200ML W/VALVE (MISCELLANEOUS) ×1 IMPLANT
CHLORAPREP W/TINT 26 (MISCELLANEOUS) ×1 IMPLANT
COVER BACK TABLE 60X90IN (DRAPES) ×1 IMPLANT
CUFF TRNQT CYL 34X4.125X (TOURNIQUET CUFF) IMPLANT
DRAPE C-ARM 42X72 X-RAY (DRAPES) IMPLANT
DRAPE C-ARMOR (DRAPES) IMPLANT
DRAPE EXTREMITY T 121X128X90 (DISPOSABLE) ×1 IMPLANT
DRAPE IMP U-DRAPE 54X76 (DRAPES) ×1 IMPLANT
DRAPE OEC MINIVIEW 54X84 (DRAPES) IMPLANT
DRAPE U-SHAPE 47X51 STRL (DRAPES) ×1 IMPLANT
DRSG MEPITEL 4X7.2 (GAUZE/BANDAGES/DRESSINGS) ×1 IMPLANT
ELECTRODE REM PT RTRN 9FT ADLT (ELECTROSURGICAL) ×1 IMPLANT
GAUZE PAD ABD 8X10 STRL (GAUZE/BANDAGES/DRESSINGS) IMPLANT
GAUZE SPONGE 4X4 12PLY STRL (GAUZE/BANDAGES/DRESSINGS) ×1 IMPLANT
GLOVE BIOGEL PI IND STRL 8 (GLOVE) ×1 IMPLANT
GLOVE SURG SS PI 7.5 STRL IVOR (GLOVE) ×1 IMPLANT
GOWN STRL REUS W/ TWL LRG LVL3 (GOWN DISPOSABLE) ×2 IMPLANT
MARKER SKIN DUAL TIP RULER LAB (MISCELLANEOUS) IMPLANT
NDL HYPO 25X1 1.5 SAFETY (NEEDLE) IMPLANT
NEEDLE HYPO 25X1 1.5 SAFETY (NEEDLE) IMPLANT
NS IRRIG 1000ML POUR BTL (IV SOLUTION) ×1 IMPLANT
PACK BASIN DAY SURGERY FS (CUSTOM PROCEDURE TRAY) ×1 IMPLANT
PADDING CAST SYNTHETIC 4X4 STR (CAST SUPPLIES) IMPLANT
PADDING CAST SYNTHETIC 6X4 NS (CAST SUPPLIES) IMPLANT
PENCIL SMOKE EVACUATOR (MISCELLANEOUS) IMPLANT
SHEET MEDIUM DRAPE 40X70 STRL (DRAPES) ×1 IMPLANT
SLEEVE SCD COMPRESS KNEE MED (STOCKING) ×1 IMPLANT
SPIKE FLUID TRANSFER (MISCELLANEOUS) IMPLANT
SPLINT FIBERGLASS 4X30 (CAST SUPPLIES) IMPLANT
SPONGE T-LAP 18X18 ~~LOC~~+RFID (SPONGE) ×1 IMPLANT
STAPLER SKIN PROX 35W (STAPLE) IMPLANT
STOCKINETTE 6 STRL (DRAPES) IMPLANT
STOCKINETTE ORTHO 6X25 (MISCELLANEOUS) ×1 IMPLANT
SUCTION TUBE FRAZIER 10FR DISP (SUCTIONS) IMPLANT
SUT ETHILON 2 0 FS 18 (SUTURE) ×2 IMPLANT
SUT MNCRL AB 3-0 PS2 18 (SUTURE) IMPLANT
SUT VIC AB 0 CT1 27XBRD ANBCTR (SUTURE) IMPLANT
SUT VIC AB 2-0 CT1 TAPERPNT 27 (SUTURE) IMPLANT
SUT VIC AB 3-0 SH 27X BRD (SUTURE) IMPLANT
SYR BULB IRRIG 60ML STRL (SYRINGE) ×1 IMPLANT
SYR CONTROL 10ML LL (SYRINGE) IMPLANT
TOWEL GREEN STERILE FF (TOWEL DISPOSABLE) ×2 IMPLANT
TUBE CONNECTING 20X1/4 (TUBING) IMPLANT
UNDERPAD 30X36 HEAVY ABSORB (UNDERPADS AND DIAPERS) ×1 IMPLANT

## 2023-10-17 NOTE — Progress Notes (Signed)
Assisted Dr. Miller with left, popliteal, ultrasound guided block. Side rails up, monitors on throughout procedure. See vital signs in flow sheet. Tolerated Procedure well. 

## 2023-10-17 NOTE — Anesthesia Procedure Notes (Signed)
 Anesthesia Regional Block: Popliteal block   Pre-Anesthetic Checklist: , timeout performed,  Correct Patient, Correct Site, Correct Laterality,  Correct Procedure, Correct Position, site marked,  Risks and benefits discussed,  Surgical consent,  Pre-op evaluation,  At surgeon's request and post-op pain management  Laterality: Left  Prep: chloraprep       Needles:  Injection technique: Single-shot  Needle Type: Stimiplex     Needle Length: 9cm  Needle Gauge: 21     Additional Needles:   Procedures:,,,, ultrasound used (permanent image in chart),,    Narrative:  Start time: 10/17/2023 6:59 AM End time: 10/17/2023 7:04 AM Injection made incrementally with aspirations every 5 mL.  Performed by: Personally  Anesthesiologist: Cleotilde Butler Dade, MD

## 2023-10-17 NOTE — Transfer of Care (Signed)
 Immediate Anesthesia Transfer of Care Note  Patient: Victoria George  Procedure(s) Performed: REMOVAL, HARDWARE (Left: Ankle) DEBRIDEMENT OF POSTERIOR TIBIALIS TENDON (Left: Ankle)  Patient Location: PACU  Anesthesia Type:General and Regional  Level of Consciousness: drowsy  Airway & Oxygen Therapy: Patient Spontanous Breathing and Patient connected to face mask oxygen  Post-op Assessment: Report given to RN and Post -op Vital signs reviewed and stable  Post vital signs: Reviewed and stable  Last Vitals:  Vitals Value Taken Time  BP 112/84 10/17/23 08:20  Temp    Pulse 78 10/17/23 08:22  Resp 13 10/17/23 08:22  SpO2 100 % 10/17/23 08:22  Vitals shown include unfiled device data.  Last Pain:  Vitals:   10/17/23 9367  TempSrc: Temporal  PainSc: 4          Complications: No notable events documented.

## 2023-10-17 NOTE — H&P (Signed)
 H&P Update:  -History and Physical Reviewed  -Patient has been re-examined  -No change in the plan of care  -The risks and benefits were presented and reviewed. The risks due to inability to remove part/all of hardware, recurrent instability, hardware/suture failure and/or irritation, new/persistent infection, stiffness, nerve/vessel/tendon injury or rerupture of repaired tendon, nonunion/malunion, allograft usage, wound healing issues, development of arthritis, failure of this surgery, possibility of external fixation with delayed definitive surgery, need for further surgery, thromboembolic events, anesthesia/medical complications, amputation, death among others were discussed. The patient acknowledged the explanation, agreed to proceed with the plan and a consent was signed.  Victoria George

## 2023-10-17 NOTE — Anesthesia Postprocedure Evaluation (Signed)
 Anesthesia Post Note  Patient: Victoria George  Procedure(s) Performed: REMOVAL, HARDWARE (Left: Ankle) DEBRIDEMENT OF POSTERIOR TIBIALIS TENDON (Left: Ankle)     Patient location during evaluation: PACU Anesthesia Type: General Level of consciousness: awake and alert Pain management: pain level controlled Vital Signs Assessment: post-procedure vital signs reviewed and stable Respiratory status: spontaneous breathing, nonlabored ventilation and respiratory function stable Cardiovascular status: blood pressure returned to baseline and stable Postop Assessment: no apparent nausea or vomiting Anesthetic complications: no   No notable events documented.  Last Vitals:  Vitals:   10/17/23 0900 10/17/23 0903  BP: 126/75   Pulse: 71 67  Resp: (!) 7 (!) 7  Temp:    SpO2: 95% 99%    Last Pain:  Vitals:   10/17/23 0915  TempSrc:   PainSc: 7                  Butler Levander Pinal

## 2023-10-17 NOTE — Anesthesia Procedure Notes (Signed)
 Procedure Name: LMA Insertion Date/Time: 10/17/2023 7:40 AM  Performed by: Maudine Grayce ORN, CRNAPre-anesthesia Checklist: Patient identified, Emergency Drugs available, Suction available and Patient being monitored Patient Re-evaluated:Patient Re-evaluated prior to induction Oxygen Delivery Method: Circle System Utilized Preoxygenation: Pre-oxygenation with 100% oxygen Induction Type: IV induction Ventilation: Mask ventilation without difficulty LMA: LMA inserted LMA Size: 4.0 Number of attempts: 1 Airway Equipment and Method: Bite block Placement Confirmation: positive ETCO2 Tube secured with: Tape Dental Injury: Teeth and Oropharynx as per pre-operative assessment

## 2023-10-17 NOTE — Op Note (Signed)
 10/17/2023  8:43 AM   PATIENT: Victoria George  48 y.o. female  MRN: [id number]   PRE-OPERATIVE DIAGNOSIS:   Closed bimalleolar fracture of left ankle with symptomatic orthopaedic hardware and posterior tibialis tendinitis   POST-OPERATIVE DIAGNOSIS:   Same   PROCEDURE: 1] Left ankle removal of deep hardware 2] Left ankle posterior tibialis tendon debridement with tenosynovectomy   SURGEON:  Lillia Mountain, MD   ASSISTANT: None   ANESTHESIA: General, regional   EBL: Minimal   TOURNIQUET:    Total Tourniquet Time Documented: Thigh (Left) - 22 minutes Total: Thigh (Left) - 22 minutes    COMPLICATIONS: None apparent   DISPOSITION: Extubated, awake and stable to recovery.   INDICATION FOR PROCEDURE: The patient presented with above diagnosis.  We discussed the diagnosis, alternative treatment options, risks and benefits of the above surgical intervention, as well as alternative non-operative treatments. All questions/concerns were addressed and the patient/family demonstrated appropriate understanding of the diagnosis, the procedure, the postoperative course, and overall prognosis. The patient wished to proceed with surgical intervention and signed an informed surgical consent as such, in each others presence prior to surgery.   PROCEDURE IN DETAIL: After preoperative consent was obtained and the correct operative site was identified, the patient was brought to the operating room supine on stretcher and transferred onto operating table. General anesthesia was induced. Preoperative antibiotics were administered. Surgical timeout was taken. The patient was then positioned supine with an ipsilateral hip bump. The operative lower extremity was prepped and draped in standard sterile fashion with a tourniquet around the thigh. The extremity was exsanguinated and the tourniquet was inflated to 275 mmHg.  We then turned to the medial malleolar fracture. After obtaining  reduction under fluoroscopy, a Kirschner wire was placed to secure this reduction and to serve as guide for a cannulated screw. We then made an incision around the wire and overdrilled this with a cannulated drill. We then placed a 4.0 mm [employer] partially threaded lag screw. This screw was noted to achieve excellent compression across the fracture site and also have excellent purchase. We verified position of the screw and fracture reduction in all planes with fluoroscopy.  We then utilized the prior direct medial ankle approach and extended this proximally and distally. Dissection was carried down to the level of the medial malleolar plate. The plate and screws were explanted completely. No motion was noted at the prior medial malleolar fracture site.   We then incised the posterior tibialis tendon sheath. Extensive scarring and tenosynovitis noted. This was debrided. No tearing noted in the tendon. The sheath was repaired.  The surgical sites were thoroughly irrigated. The tourniquet was deflated and hemostasis achieved. Betadine  and vancomycin  powder were applied. The deep layers were closed using 2-0 vicryl. The skin was closed without tension.    The leg was cleaned with saline and sterile dressings with gauze were applied. A well padded bulky short leg splint was applied. The patient was awakened from anesthesia and transported to the recovery room in stable condition.    FOLLOW UP PLAN: -transfer to PACU, then home -strict NWB operative extremity until at least block wears off, maximum elevation -maintain short leg splint until follow up -DVT ppx: Aspirin  81 mg twice daily while NWB -follow up as outpatient within 7-10 days for wound check with exchange of short leg splint to short leg cast -sutures out in 2-3 weeks in outpatient office   RADIOGRAPHS: AP, lateral, oblique and stress radiographs of the left  ankle were obtained intraoperatively. These showed interval hardware  removal. Manual stress radiographs were taken and the joints were noted to be stable following procedure. All hardware is appropriately positioned and of the appropriate lengths. No other acute injuries are noted.   Lillia Mountain [facility]

## 2023-10-17 NOTE — Anesthesia Preprocedure Evaluation (Signed)
 Anesthesia Evaluation  Patient identified by MRN, date of birth, ID band Patient awake    Reviewed: Allergy & Precautions, NPO status , Patient's Chart, lab work & pertinent test results  History of Anesthesia Complications Negative for: history of anesthetic complications  Airway Mallampati: II  TM Distance: >3 FB Neck ROM: Full    Dental  (+) Dental Advisory Given, Teeth Intact   Pulmonary asthma    Pulmonary exam normal        Cardiovascular hypertension, Pt. on medications Normal cardiovascular exam     Neuro/Psych negative neurological ROS  negative psych ROS   GI/Hepatic negative GI ROS, Neg liver ROS,,,  Endo/Other    Class 3 obesity  Renal/GU negative Renal ROS     Musculoskeletal negative musculoskeletal ROS (+)    Abdominal  (+) + obese  Peds  Hematology negative hematology ROS (+)   Anesthesia Other Findings   Reproductive/Obstetrics                              Anesthesia Physical Anesthesia Plan  ASA: 3  Anesthesia Plan: General   Post-op Pain Management: Regional block*   Induction: Intravenous  PONV Risk Score and Plan: 3 and Treatment may vary due to age or medical condition, Ondansetron , Dexamethasone  and Midazolam   Airway Management Planned: LMA  Additional Equipment: None  Intra-op Plan:   Post-operative Plan: Extubation in OR  Informed Consent: I have reviewed the patients History and Physical, chart, labs and discussed the procedure including the risks, benefits and alternatives for the proposed anesthesia with the patient or authorized representative who has indicated his/her understanding and acceptance.     Dental advisory given  Plan Discussed with: CRNA and Anesthesiologist  Anesthesia Plan Comments:          Anesthesia Quick Evaluation

## 2023-10-18 ENCOUNTER — Encounter (HOSPITAL_BASED_OUTPATIENT_CLINIC_OR_DEPARTMENT_OTHER): Payer: Self-pay | Admitting: Orthopaedic Surgery

## 2024-04-18 ENCOUNTER — Other Ambulatory Visit: Payer: Self-pay | Admitting: Family Medicine
